# Patient Record
Sex: Female | Born: 1980
Health system: Southern US, Community
[De-identification: ages and names within clinical notes are randomized; demographics above are authoritative.]

## PROBLEM LIST (undated history)

## (undated) DIAGNOSIS — T8859XA Other complications of anesthesia, initial encounter: Secondary | ICD-10-CM

## (undated) DIAGNOSIS — Z9889 Other specified postprocedural states: Secondary | ICD-10-CM

## (undated) DIAGNOSIS — R112 Nausea with vomiting, unspecified: Secondary | ICD-10-CM

## (undated) DIAGNOSIS — E669 Obesity, unspecified: Secondary | ICD-10-CM

## (undated) DIAGNOSIS — J45909 Unspecified asthma, uncomplicated: Secondary | ICD-10-CM

## (undated) DIAGNOSIS — I82409 Acute embolism and thrombosis of unspecified deep veins of unspecified lower extremity: Secondary | ICD-10-CM

## (undated) DIAGNOSIS — Z8489 Family history of other specified conditions: Secondary | ICD-10-CM

## (undated) DIAGNOSIS — L509 Urticaria, unspecified: Secondary | ICD-10-CM

## (undated) HISTORY — DX: Acute embolism and thrombosis of unspecified deep veins of unspecified lower extremity: I82.409

## (undated) HISTORY — PX: KNEE SURGERY: SHX244

## (undated) HISTORY — PX: ABDOMINAL HYSTERECTOMY: SHX81

## (undated) HISTORY — PX: CHOLECYSTECTOMY: SHX55

## (undated) HISTORY — DX: Urticaria, unspecified: L50.9

---
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
## 2001-11-29 ENCOUNTER — Other Ambulatory Visit: Admission: RE | Admit: 2001-11-29 | Discharge: 2001-11-29 | Payer: Self-pay | Admitting: Obstetrics and Gynecology

## 2002-01-11 ENCOUNTER — Ambulatory Visit (HOSPITAL_COMMUNITY): Admission: RE | Admit: 2002-01-11 | Discharge: 2002-01-11 | Payer: Self-pay | Admitting: Internal Medicine

## 2003-06-26 ENCOUNTER — Other Ambulatory Visit: Admission: RE | Admit: 2003-06-26 | Discharge: 2003-06-26 | Payer: Self-pay | Admitting: Obstetrics and Gynecology

## 2003-12-03 ENCOUNTER — Inpatient Hospital Stay (HOSPITAL_COMMUNITY): Admission: AD | Admit: 2003-12-03 | Discharge: 2003-12-03 | Payer: Self-pay | Admitting: Obstetrics and Gynecology

## 2003-12-25 ENCOUNTER — Inpatient Hospital Stay (HOSPITAL_COMMUNITY): Admission: AD | Admit: 2003-12-25 | Discharge: 2003-12-25 | Payer: Self-pay | Admitting: Obstetrics and Gynecology

## 2004-01-09 ENCOUNTER — Inpatient Hospital Stay (HOSPITAL_COMMUNITY): Admission: AD | Admit: 2004-01-09 | Discharge: 2004-01-12 | Payer: Self-pay | Admitting: Obstetrics and Gynecology

## 2004-02-11 ENCOUNTER — Other Ambulatory Visit: Admission: RE | Admit: 2004-02-11 | Discharge: 2004-02-11 | Payer: Self-pay | Admitting: Obstetrics and Gynecology

## 2004-03-30 ENCOUNTER — Other Ambulatory Visit: Admission: RE | Admit: 2004-03-30 | Discharge: 2004-03-30 | Payer: Self-pay | Admitting: Obstetrics and Gynecology

## 2004-05-22 ENCOUNTER — Emergency Department (HOSPITAL_COMMUNITY): Admission: EM | Admit: 2004-05-22 | Discharge: 2004-05-22 | Payer: Self-pay | Admitting: Emergency Medicine

## 2004-05-24 ENCOUNTER — Emergency Department (HOSPITAL_COMMUNITY): Admission: EM | Admit: 2004-05-24 | Discharge: 2004-05-24 | Payer: Self-pay | Admitting: Emergency Medicine

## 2004-05-27 ENCOUNTER — Encounter (INDEPENDENT_AMBULATORY_CARE_PROVIDER_SITE_OTHER): Payer: Self-pay | Admitting: Specialist

## 2004-05-27 ENCOUNTER — Observation Stay (HOSPITAL_COMMUNITY): Admission: RE | Admit: 2004-05-27 | Discharge: 2004-05-28 | Payer: Self-pay | Admitting: General Surgery

## 2004-10-08 ENCOUNTER — Other Ambulatory Visit: Admission: RE | Admit: 2004-10-08 | Discharge: 2004-10-08 | Payer: Self-pay | Admitting: Obstetrics and Gynecology

## 2005-04-29 ENCOUNTER — Other Ambulatory Visit: Admission: RE | Admit: 2005-04-29 | Discharge: 2005-04-29 | Payer: Self-pay | Admitting: Obstetrics and Gynecology

## 2006-01-11 ENCOUNTER — Emergency Department (HOSPITAL_COMMUNITY): Admission: EM | Admit: 2006-01-11 | Discharge: 2006-01-11 | Payer: Self-pay | Admitting: Emergency Medicine

## 2006-06-24 ENCOUNTER — Ambulatory Visit (HOSPITAL_COMMUNITY): Admission: RE | Admit: 2006-06-24 | Discharge: 2006-06-24 | Payer: Self-pay | Admitting: Obstetrics and Gynecology

## 2006-06-29 ENCOUNTER — Ambulatory Visit (HOSPITAL_COMMUNITY): Admission: RE | Admit: 2006-06-29 | Discharge: 2006-06-29 | Payer: Self-pay | Admitting: Obstetrics and Gynecology

## 2006-06-29 ENCOUNTER — Encounter (INDEPENDENT_AMBULATORY_CARE_PROVIDER_SITE_OTHER): Payer: Self-pay | Admitting: Specialist

## 2007-02-23 ENCOUNTER — Ambulatory Visit (HOSPITAL_COMMUNITY): Admission: RE | Admit: 2007-02-23 | Discharge: 2007-02-23 | Payer: Self-pay | Admitting: Obstetrics and Gynecology

## 2007-03-14 ENCOUNTER — Ambulatory Visit (HOSPITAL_COMMUNITY): Admission: RE | Admit: 2007-03-14 | Discharge: 2007-03-14 | Payer: Self-pay | Admitting: Obstetrics and Gynecology

## 2007-05-05 ENCOUNTER — Inpatient Hospital Stay (HOSPITAL_COMMUNITY): Admission: AD | Admit: 2007-05-05 | Discharge: 2007-05-05 | Payer: Self-pay | Admitting: Obstetrics and Gynecology

## 2007-05-14 ENCOUNTER — Inpatient Hospital Stay (HOSPITAL_COMMUNITY): Admission: AD | Admit: 2007-05-14 | Discharge: 2007-05-14 | Payer: Self-pay | Admitting: Obstetrics and Gynecology

## 2007-05-15 ENCOUNTER — Inpatient Hospital Stay (HOSPITAL_COMMUNITY): Admission: AD | Admit: 2007-05-15 | Discharge: 2007-05-16 | Payer: Self-pay | Admitting: Obstetrics and Gynecology

## 2007-05-26 ENCOUNTER — Inpatient Hospital Stay (HOSPITAL_COMMUNITY): Admission: AD | Admit: 2007-05-26 | Discharge: 2007-05-30 | Payer: Self-pay | Admitting: Obstetrics and Gynecology

## 2007-05-27 ENCOUNTER — Encounter (INDEPENDENT_AMBULATORY_CARE_PROVIDER_SITE_OTHER): Payer: Self-pay | Admitting: Obstetrics and Gynecology

## 2008-01-17 ENCOUNTER — Encounter: Admission: RE | Admit: 2008-01-17 | Discharge: 2008-01-17 | Payer: Self-pay | Admitting: Orthopedic Surgery

## 2009-01-27 ENCOUNTER — Encounter: Admission: RE | Admit: 2009-01-27 | Discharge: 2009-01-27 | Payer: Self-pay | Admitting: Obstetrics and Gynecology

## 2010-08-24 ENCOUNTER — Inpatient Hospital Stay (HOSPITAL_COMMUNITY)
Admission: AD | Admit: 2010-08-24 | Discharge: 2010-08-26 | Payer: Self-pay | Source: Home / Self Care | Attending: Obstetrics and Gynecology | Admitting: Obstetrics and Gynecology

## 2010-08-26 LAB — CBC
HCT: 36.2 % (ref 36.0–46.0)
HCT: 29.4 % — ABNORMAL LOW (ref 36.0–46.0)
Hemoglobin: 12.2 g/dL (ref 12.0–15.0)
Hemoglobin: 9.5 g/dL — ABNORMAL LOW (ref 12.0–15.0)
MCH: 29.5 pg (ref 26.0–34.0)
MCH: 28.5 pg (ref 26.0–34.0)
MCHC: 33.7 g/dL (ref 30.0–36.0)
MCHC: 32.3 g/dL (ref 30.0–36.0)
MCV: 87.7 fL (ref 78.0–100.0)
MCV: 88.3 fL (ref 78.0–100.0)
Platelets: 293 10*3/uL (ref 150–400)
Platelets: 242 10*3/uL (ref 150–400)
RBC: 4.13 MIL/uL (ref 3.87–5.11)
RBC: 3.33 MIL/uL — ABNORMAL LOW (ref 3.87–5.11)
RDW: 14.2 % (ref 11.5–15.5)
RDW: 14.4 % (ref 11.5–15.5)
WBC: 10.1 10*3/uL (ref 4.0–10.5)
WBC: 9.9 10*3/uL (ref 4.0–10.5)

## 2010-08-26 LAB — RPR: RPR Ser Ql: NONREACTIVE

## 2010-09-01 NOTE — Discharge Summary (Signed)
  Krista [name], Krista [name] NO.:  [id number]  MEDICAL RECORD NO.:  [id number]          PATIENT TYPE:  INP  LOCATION:  9131                          FACILITY:  [facility]  PHYSICIAN:  Guy Sandifer. Henderson Cloud, M.D. DATE OF BIRTH:  Dec 24, 1980  DATE OF ADMISSION:  08/24/2010 DATE OF DISCHARGE:  08/26/2010                              DISCHARGE SUMMARY   ADMITTING DIAGNOSES: 1. Intrauterine pregnancy at 37-1/7 weeks estimated gestational age. 2. Previous cesarean section. 3. Spontaneous onset of labor.  DISCHARGE DIAGNOSES: 1. Status post low transverse cesarean section. 2. Viable female infant.  PROCEDURE:  Repeat low transverse cesarean section.  REASON FOR ADMISSION:  Please see written H and P.  HOSPITAL COURSE:  The patient is a 30 year old gravida 3, para 2 that was admitted to [facility] at 337-1/7 weeks estimated gestational age with spontaneous onset of labor.  The patient had a previous vaginal delivery as well as a cesarean delivery.  She has desired to proceed with a repeat low transverse cesarean section.  The patient was then taken to the operating room where spinal anesthesia was administered without difficulty.  A low transverse incision was made with delivery of a viable female infant weighing 5 pounds 11 ounces with Apgars of 9 at 1 minute and 9 at 5 minutes.  The patient tolerated the procedure well and was taken to the recovery room in stable condition. Postoperative day #1, the patient was without complaint.  Vital signs were stable.  She was afebrile.  Abdomen was soft.  Fundus firm and nontender.  Abdominal dressing noted to be clean, dry and intact.  Foley had been discontinued.  She was voiding well.  Laboratory findings showed a hemoglobin 9.5 and platelet count showed of 242,000.  Blood type was known to be A positive.  On postoperative day #2, the patient did desire early discharge.  Vital signs were stable.  She was afebrile. Abdomen was  soft.  Fundus firm and nontender.  Incision was clean, dry and intact.  Discharge instructions were reviewed and the patient was later discharged home.  CONDITION ON DISCHARGE:  Stable.  DIET:  Regular as tolerated.  ACTIVITY:  No heavy lifting, no driving x2 weeks, no vaginal entry.  FOLLOWUP:  The patient to follow up in the office in 1-2 weeks for an incision check.  She is to call for temperature greater than 100 degrees, persistent nausea, vomiting, heavy vaginal bleeding and/or redness or drainage from the incisional site.  DISCHARGE MEDICATIONS: 1. Tylox #30 one p.o. every 4-6 h. p.r.n. 2. Motrin 600 mg every 6 hours. 3. Prenatal vitamins 1 p.o. daily.     Julio Sicks, N.P.   ______________________________ Guy Sandifer. Henderson Cloud, M.D.    CC/MEDQ  D:  08/26/2010  T:  08/26/2010  Job:  [id number]  Electronically Signed by Julio Sicks N.P. on 08/28/2010 08:54:26 AM Electronically Signed by Harold Hedge M.D. on 09/01/2010 08:47:27 AM

## 2010-09-07 ENCOUNTER — Inpatient Hospital Stay: Admission: RE | Admit: 2010-09-07 | Payer: Self-pay | Source: Home / Self Care | Admitting: Obstetrics and Gynecology

## 2010-09-11 NOTE — Op Note (Signed)
NAMEMarland Kitchen  Krista, [name] NO.:  [id number]  MEDICAL RECORD NO.:  [id number]          PATIENT TYPE:  INP  LOCATION:  9131                          [facility]ILITY:  [facility]  PHYSICIAN:  Juluis Mire, M.D.   DATE OF BIRTH:  12-Feb-1981  DATE OF PROCEDURE:  08/24/2010 DATE OF DISCHARGE:                              OPERATIVE REPORT   PREOPERATIVE DIAGNOSES: 1. Intrauterine pregnancy at 37-1/7th with previous cesarean section. 2. Spontaneous onset of labor with desires repeat cesarean section.  POSTOPERATIVE DIAGNOSES: 1. Intrauterine pregnancy at 37-1/7th with previous cesarean section. 2. Spontaneous onset of labor with desires repeat cesarean section.  OPERATIVE PROCEDURE:  Repeat low transverse cesarean section.  SURGEON:  Juluis Mire, MD  ANESTHESIA:  Spinal.  ESTIMATED BLOOD LOSS:  800 mL.  PACKS AND DRAINS:  None.  INTRAOPERATIVE BLOOD PLACED:  None.  COMPLICATIONS:  None.  INDICATIONS:  A 30 year old gravida 3, para 2 at 37-1/7th weeks, who presents with spontaneous onset of labor as documented of progressive cervical changes.  The patient had a previous vaginal delivery as well as a followup cesarean section.  She desires to proceed with repeat cesarean section.  The risks were explained including the risk of infection.  The risk of hemorrhage could require transfusion with risk of AIDS or hepatitis.  Excessive bleeding could require hysterectomy. The risk of injury to adjacent organs including bladder, bowel, ureters that could require further exploratory surgery.  Risk of deep venous thrombosis and pulmonary embolus.  The patient does understand the indications, risks.  DESCRIPTION OF PROCEDURE:  The patient was taken to the OR, placed in supine position with the left lateral tilt.  After satisfactory level of spinal anesthesia was obtained, the abdomen was prepped out with Betadine and draped in sterile field.  Low transverse skin incision was made  with knife, carried through subcutaneous tissue.  Anterior rectus fascia was entered sharply and incision was fashioned laterally.  The fascia was taken off the muscle superiorly and inferiorly.  Rectus muscles were separated in the midline.  Perineum was entered sharply. Incision and perineum extended both superiorly and inferiorly.  A low transverse bladder flap was developed.  Low-transverse uterine incision was begun with knife, extended laterally using manual traction.  The infant presented in vertex presentation, delivered with elevation of head and fundal pressure.  The infant was a viable female, weighing 5 pounds and 11 ounces, Apgars were 9 and 9.  Placenta was removed manually.  Uterus was then exteriorized for closure.  Tubes and ovaries were unremarkable.  Uterus was closed in running locking suture of 0 chromic using a 2-layer closure technique.  Tubes and ovaries were unremarkable.  Uterus was returned to the abdominal cavity.  Areas of bleeding were brought under control with figure-of-eights of 0 chromic. Hemostasis was excellent.  We did irrigate the pelvic cavity.  Urine output remained clear and adequate.  Muscles and peritoneum were closed with a running suture of 3-0 Vicryl.  Fascia was closed with a running suture of 0 PDS.  Adipose layer was closed with interrupted sutures of 2- 0 plain catgut.  Skin  was closed with running subcuticular of 4-0 Monocryl.  Steri-Strips were applied.  Sponge, instrument, and needle count was reported as correct by circulating nurse x2.  Foley catheter remained clear at the time of closure.  The patient tolerated the procedure well, was returned to recovery room in good condition.     Juluis Mire, M.D.     JSM/MEDQ  D:  08/24/2010  T:  08/24/2010  Job:  [id number]  Electronically Signed by Richardean Chimera M.D. on 09/11/2010 01:35:43 PM

## 2010-12-22 NOTE — Op Note (Signed)
Krista [name], Krista [name] NO.:  [id number]   MEDICAL RECORD NO.:  [id number]          PATIENT TYPE:  INP   LOCATION:  9125                          [facility]ILITY[facility]  [facility]   PHYSICIAN:  Guy Sandifer. Henderson Cloud, M.D. DATE OF BIRTH:  [birth year]   DATE OF PROCEDURE:  05/27/2007  DATE OF DISCHARGE:                               OPERATIVE REPORT   PREOPERATIVE DIAGNOSIS:  Arrest of dilation.   POSTOPERATIVE DIAGNOSIS:  Arrest of dilation.   PROCEDURE:  Low-transverse cesarean section.   SURGEON:  Harold Hedge, MD.   ANESTHESIA:  Epidural, Burnett Corrente, M.D.   SPECIMENS:  Placenta to pathology.   ESTIMATED BLOOD LOSS:  800 mL.   FINDINGS:  Viable female infant, Apgars of 9 and 9 at 1 and 5 minutes  respectively.  Birth weight 6 pounds 5 ounces.  Arterial cord pH 7.34.   INDICATIONS AND CONSENT:  This patient is a 30 year old, married, white  female G3, P1 with an EDC of June 06, 2007.  Prenatal care has been  complicated by worsening left fetal hydronephrosis on ultrasound.  Group  B beta strep culture is negative.  The patient is admitted on May 26, 2007 for two-stage induction of labor.  She received Cytotec on the  morning of May 27, 2007 and was placed on Pitocin.  Contractions are  firm and regular.  There is a suspicion of decelerations on the external  fetal monitor.  Artificial rupture of membranes is carried out and the  cervix is 3 cm dilated, 50% effaced, -3 station.  Internal fetal  monitors were placed.  Fetal heartbeat is reactive.  The labor pattern  is adequate.  After an additional 1 hour of good labor,  the cervix was  unchanged and the vertex remained out of the fetal pelvis.  The options  were discussed with the patient and cesarean section was decided upon.  The potential risks and complications were reviewed with the patient and  her husband including but not limited to infection, organ damage,  bleeding requiring transfusion of blood products  with possible HIV and  hepatitis acquisition, DVT, PE, and pneumonia.  All questions were  answered and consent is on the chart.   PROCEDURE:  The patient is taken to the operating room where she is  identified,  epidural anesthetic is augmented to the surgical level and  she is placed in the dorsal supine position with a 15 degree left  lateral wedge.  A Foley catheter is in place.  She is prepped and draped  in a sterile fashion.  After testing for adequate epidural anesthesia,  the skin is entered through a Pfannenstiel incision and dissection is  carried out in layers to the peritoneum.  The peritoneum was incised,  extended superiorly and inferiorly.  The vesicouterine peritoneum was  taken down cephalolaterally, the bladder flap was developed and bladder  blade was placed.  The uterus is incised in a low transverse manner, the  uterine cavity is entered bluntly with a hemostat.  The uterine incision  extended cephalolaterally with fingers.  The vertex is  then delivered.  Oral and nasal pharynx were suctioned and the remainder of the baby is  delivered.  There was a cord wrapped around the baby's shoulder. The  cord is clamped and cut, the baby is handed to the waiting pediatrics  team.  The placenta is manually delivered and sent to pathology.  The  uterine cavity is clean.  The uterus is closed in two running locking  imbricating layers of #0 Monocryl suture which achieves good hemostasis.  Tubes and ovaries were normal bilaterally.  The anterior peritoneum was  closed in running fashion with #0 Monocryl suture which is also used to  reapproximate the pyramidalis muscle in the midline.  The anterior  rectus fascia is closed in running fashion with #0 PDS suture and the  subcutaneous tissue was reapproximated with 2-0 plain suture and the  skin is closed with clips.  All sponge, instrument and needle counts  were correct and the patient is transferred to the recovery room in   stable condition.      Guy Sandifer Henderson Cloud, M.D.  Electronically Signed     JET/MEDQ  D:  05/27/2007  T:  05/29/2007  Job:  [id number]

## 2010-12-25 NOTE — Op Note (Signed)
NAMEMarland [name]  CALLYN, SEVERTSON NO.:  [id number]   MEDICAL RECORD NO.:  [id number]          PATIENT TYPE:  AMB   LOCATION:  SDC                           [facility]   PHYSICIAN:  Guy Sandifer. Henderson Cloud, M.D. DATE OF BIRTH:  1981/04/13   DATE OF PROCEDURE:  06/29/2006  DATE OF DISCHARGE:                                 OPERATIVE REPORT   PREOPERATIVE DIAGNOSIS:  Spontaneous abortion.   POSTOPERATIVE DIAGNOSIS:  Spontaneous abortion.   PROCEDURE:  1. Dilatation evacuation.  2. 1% Xylocaine paracervical block.   SURGEON:  Harold Hedge, M.D.   ANESTHESIA:  MAC.   SPECIMENS:  1. Products of conception to pathology.  2. A small portion products of conception to lab for karyotype.   ESTIMATED BLOOD LOSS:  Minimal.   INDICATIONS AND CONSENT:  The patient is a 30 year old married white female  G2, P1 with spontaneous abortion.  Details dictated in history and physical.  Procedure has been discussed preoperatively.  Potential risks and  complications discussed preoperatively with the patient and her husband  including but not limited to infection, uterine perforation, organ damage,  bleeding requiring transfusion of blood products with possible HIV and  hepatitis acquisition, DVT, PE, pneumonia, laparotomy, laparoscopy and  intrauterine synechia and secondary infertility.  All questions were  answered and consent signed on the chart.   PROCEDURE:  The patient taken to operating room where she is identified,  placed in dorsosupine position and given intravenous sedation.  She is then  placed in dorsal lithotomy position where she is prepped, bladder straight  catheterized and she is draped in sterile fashion.  The bivalve speculum was  placed in vagina and the anterior cervical lip was injected with 1% plain  Xylocaine and grasped with single-tooth tenaculum.  Paracervical block is  then placed at 2, 4, 5, 7, 8 and 10 o'clock positions with approximately 20  mL total of 1%  plain Xylocaine.  Cervix gently progressively dilated to 27  dilator.  #7 curved curette was placed in the intrauterine cavity and  suction curettage is carried out for obvious products of conception.  10  units of Pitocin were added to the remaining 500 mL of IV fluids after  initial pass of the suction curette.  Alternating sharp and suction  curettage is carried until the cavity  was clean.  Good hemostasis was noted.  The patient is given 1 gram of Ancef  IV.  Blood type is A+.  Small portion of the products of conception was sent  for karyotype.  She will be discharged home on Methergine 0.2 mg #6 one p.o.  t.i.d..  Follow-up in the office in 2 weeks.      Guy Sandifer Henderson Cloud, M.D.  Electronically Signed     JET/MEDQ  D:  06/29/2006  T:  06/29/2006  Job:  [id number]

## 2010-12-25 NOTE — H&P (Signed)
NAMEMarland Kitchen  Krista [name], Krista [name] NO.:  [id number]   MEDICAL RECORD NO.:  [id number]          PATIENT TYPE:  AMB   LOCATION:  SDC                           [facility]   PHYSICIAN:  Guy Sandifer. Henderson Cloud, M.D. DATE OF BIRTH:  Oct 23, 1980   DATE OF ADMISSION:  06/29/2006  DATE OF DISCHARGE:                                HISTORY & PHYSICAL   CHIEF COMPLAINT:  Miscarriage.   HISTORY OF PRESENT ILLNESS:  This patient is a 30 year old married white  female, G2 P1, who has been followed for a first trimester gestation with a  low fetal heart rate and a subchorionic hemorrhage.  The fetal pole is  consistent with approximately 6-1/2 weeks estimated gestational age.  There  is no fetal heartbeat noted on prolonged examination.  The patient is being  admitted for dilatation and evacuation.   PAST MEDICAL HISTORY:  Negative.   PAST SURGICAL HISTORY:  Cholecystectomy.   FAMILY HISTORY:  Chronic hypertension in mother.  Renal agenesis, paternal  grandmother.  Alzheimer's disease, maternal great-grandmother.  Skin cancer,  maternal grandfather.  Type 2 diabetes, maternal grandfather.   OBSTETRIC HISTORY:  Vaginal delivery x1.   SOCIAL HISTORY:  Denies tobacco, alcohol, or drug abuse.   MEDICATIONS:  Prenatal vitamins.   ALLERGIES:  1. PREDNISONE.  2. CODEINE.   REVIEW OF SYSTEMS:  NEUROLOGIC:  Denies headache.  CARDIOVASCULAR:  Denies  chest pain.  PULMONARY:  Denies shortness of breath.  GI:  Denies recent  changes in bowel habits.   PHYSICAL EXAMINATION:  GENERAL:  Height 5 feet 0 inches, weight 190 pounds.  HEENT:  Without thyromegaly.  LUNGS:  Clear to auscultation.  HEART:  Regular rate and rhythm.  BACK:  Without CVA tenderness.  BREASTS:  Not examined.  ABDOMEN:  Soft, nontender, without masses.  PELVIC:  Cervix is closed, thick, and high.  Uterus is 6 weeks in size.  Adnexa nontender, without palpable masses.  EXTREMITIES:  Grossly within normal limits.  NEUROLOGIC:  Within normal limits.   LABORATORIES:  Blood type is A positive.   ASSESSMENT:  Spontaneous miscarriage.   PLAN:  Dilatation and evacuation.      Guy Sandifer Henderson Cloud, M.D.  Electronically Signed     JET/MEDQ  D:  06/28/2006  T:  06/28/2006  Job:  [id number]

## 2010-12-25 NOTE — Op Note (Signed)
Krista [name], PEGGS NO.:  [id number]   MEDICAL RECORD NO.:  [id number]          PATIENT TYPE:  AMB   LOCATION:  DAY                          FACILITY:  [facility]   PHYSICIAN:  Sharlet Salina T. Hoxworth, M.D.DATE OF BIRTH:  November 22, 1980   DATE OF PROCEDURE:  05/27/2004  DATE OF DISCHARGE:                                 OPERATIVE REPORT   PREOPERATIVE DIAGNOSIS:  Cholelithiasis, cholecystitis.   POSTOPERATIVE DIAGNOSIS:  Cholelithiasis, cholecystitis.   PROCEDURE:  Laparoscopic cholecystectomy with intraoperative cholangiogram.   SURGEON:  Sharlet Salina T. Hoxworth, M.D.   ANESTHESIA:  General.   BRIEF HISTORY:  Krista [name] is a 30 year old white female, recently  postpartum, who presents with five days of persistent epigastric and right  upper quadrant abdominal pain associated with nausea.  She has had a workup,  including a gallbladder ultrasound showing multiple small stones and a  borderline dilated common bile duct.  LFTs are normal.  She is felt to have  ongoing biliary colic versus early cholecystitis, and urgent laparoscopic  cholecystectomy with cholangiogram has been recommended and accepted.  The  procedure, indications, risks of bleeding, infection, bile leak, and bile  duct injury were discussed and understood.  She is now brought to the  operating room for this procedure.   DESCRIPTION OF PROCEDURE:  Patient is brought to the operating room and  placed in a supine position on the operating room table.  General  endotracheal anesthesia was induced.  She received preoperative antibiotics.  The abdomen was widely and sterilely prepped and draped.  Local anesthesia  was used to infiltrate the trocar sites prior to the incisions.  A 1 cm  incision was made in the umbilicus, dissected, and carried down to the  midline fascia, which was sharply incised at 1 cm.  The peritoneum was  entered under direct vision.  Through a mattress suture of 0 Vicryl, the  Hasson  trocar was placed.  Under direct vision, a 10 mm trocar was placed in  the subxiphoid area and two 5 mm trocars on the right subcostal margin.  The  gallbladder is visualized and appeared moderately tense and edematous,  although not severely acutely inflamed.  The fundus was grasped and elevated  over the liver.  There were some chronic omental adhesions to the  infundibulum, which were sharply taken down with cautery, and the  infundibulum retracted inferolaterally.  Fibrofatty tissue was stripped off  the neck of the gallbladder, and Calot's triangle was thoroughly dissected.  The cystic duct  and cystic artery were identified.  The cystic duct  was  dissected free for about 1 cm, and the cystic duct/gallbladder junction  dissected free at 160 degrees.  When the anatomy was cleared, the cystic  duct  was clipped at its junction with the gallbladder, and the cystic  artery was clipped.  Operative cholangiogram was then obtained through the  cystic duct, which showed good filling of normal common bile duct and  intrahepatic ducts with free flow into the duodenum and no filling defects.  Following this, the cholangiocatheter was removed, and the cystic  duct was  doubly clipped proximally and divided.  The cystic artery was further  clipped and divided.  The gallbladder is then dissected free from its bed  using hook cautery and removed intact through the umbilicus.  The operative  site was inspected for hemostasis, which appeared complete.  The trocars  were removed,  and all CO2 evacuated.  The mattress suture was secured at the umbilicus.  Skin incisions were closed with interrupted subcuticular, 4-0 Monocryl, and  Steri-Strips.  Sponge, needle, and instrument counts were correct.  Dry  sterile dressings were applied.  Patient was taken to the recovery room in  good condition.      BTH/MEDQ  D:  05/27/2004  T:  05/27/2004  Job:  [id number]

## 2010-12-25 NOTE — Discharge Summary (Signed)
NAMEMarland [name]  CARSYN, BOSTER NO.:  [id number]   MEDICAL RECORD NO.:  [id number]          PATIENT TYPE:  INP   LOCATION:  9125                          [facility]   PHYSICIAN:  Freddy Finner, M.D.   DATE OF BIRTH:  [birth year]   DATE OF ADMISSION:  05/26/2007  DATE OF DISCHARGE:  05/30/2007                               DISCHARGE SUMMARY   ADMITTING DIAGNOSES:  1. Intrauterine pregnancy at 34 weeks estimated gestational age.  2. Two-stage induction of labor secondary to left fetal hydronephrosis      as noted by ultrasound.   DISCHARGE DIAGNOSES:  1. Status post low transverse cesarean section secondary to arrest of      dilatation.  2. Viable female infant.   PROCEDURE:  Primary low transverse cesarean section.   REASON FOR ADMISSION:  Please see written H&P.   HOSPITAL COURSE:  The patient is a 26-year white married female gravida  3, para 1 that was admitted to [facility] for two-stage  induction of labor secondary to fetal hydronephrosis.  On admission the  patient did receive Cytotec and was later placed on Pitocin.  Contractions were regular.  There was a suspicion of a deceleration on  the external fetal monitor.  Artificial rupture of membranes was  performed which revealed clear fluid.  Cervix was dilated to 3 cm, 50%  effaced, with vertex at -3 station.  Internal fetal monitors were placed  for adequate monitoring of her labor and fetal heart tones.  After an  additional hour of good labor the cervix remained unchanged and the  vertex remained out of the pelvis.  Decision was made to proceed with a  primary low transverse cesarean section.  The patient was then  transferred to the operating room where epidural was dosed to an  adequate surgical level.  A low transverse incision was made with the  delivery of a viable female infant weighing 6 pounds 5 ounces with Apgars  of 9 at one minute and 9 at five minutes.  Arterial cord pH was  7.34.  The patient tolerated the procedure well and was taken to the recovery  room in stable condition.  On postoperative day #1 the patient was  without complaint.  Vital signs were stable.  She was afebrile.  Abdomen  was soft with good return of bowel function.  Fundus was firm and  nontender.  Abdominal dressing was noted to be clean, dry and intact.  Laboratory findings revealed hemoglobin of 9.1.  On postoperative day #2  the patient was without complaint.  Vital signs were remained stable.  She was afebrile.  Abdomen was soft.  Fundus firm and nontender.  Abdominal dressing had been removed revealing an incision that was  clean, dry and intact.  On postoperative day #3 the patient was without  complaint.  Vital signs remained stable.  Abdomen was soft.  Fundus firm  and nontender.  Incision was clean, dry and intact.  Staples were  removed and the patient was later discharged home.   CONDITION ON DISCHARGE:  Good.  DIET:  Regular as tolerated.   ACTIVITY:  No heavy lifting, no driving x2 weeks, no vaginal entry.   FOLLOWUP:  The patient to follow up in the office in 1-2 weeks for an  incision check.  She is to call for temperature greater than 100  degrees, persistent nausea and vomiting, heavy vaginal bleeding, and/or  redness or drainage from the incisional site.   DISCHARGE MEDICATIONS:  1. Percocet 5/325 #30 one p.o. every 4-6 hours p.r.n.  2. Motrin 600 mg every 6 hours.  3. Prenatal vitamins one p.o. daily.  4. Colace one p.o. daily p.r.n.      Julio Sicks, N.P.      Freddy Finner, M.D.  Electronically Signed    CC/MEDQ  D:  06/20/2007  T:  06/20/2007  Job:  [id number]

## 2011-05-19 LAB — CBC
HCT: 26.5 — ABNORMAL LOW
HCT: 34 — ABNORMAL LOW
Hemoglobin: 11.5 — ABNORMAL LOW
Hemoglobin: 9.1 — ABNORMAL LOW
MCHC: 33.7
MCHC: 34.3
MCV: 88.2
MCV: 89.4
Platelets: 278
Platelets: 338
RBC: 3.01 — ABNORMAL LOW
RBC: 3.81 — ABNORMAL LOW
RDW: 14.6 — ABNORMAL HIGH
RDW: 14.7 — ABNORMAL HIGH
WBC: 11.1 — ABNORMAL HIGH
WBC: 12.9 — ABNORMAL HIGH

## 2011-05-19 LAB — RAPID HIV SCREEN (WH-MAU): Rapid HIV Screen: NONREACTIVE

## 2011-05-19 LAB — RPR: RPR Ser Ql: NONREACTIVE

## 2011-05-20 LAB — URINALYSIS, ROUTINE W REFLEX MICROSCOPIC
Bilirubin Urine: NEGATIVE
Glucose, UA: NEGATIVE
Hgb urine dipstick: NEGATIVE
Ketones, ur: NEGATIVE
Nitrite: NEGATIVE
Protein, ur: NEGATIVE
Specific Gravity, Urine: 1.01
Urobilinogen, UA: 0.2
pH: 7

## 2011-05-30 ENCOUNTER — Emergency Department (HOSPITAL_BASED_OUTPATIENT_CLINIC_OR_DEPARTMENT_OTHER)
Admission: EM | Admit: 2011-05-30 | Discharge: 2011-05-30 | Disposition: A | Discharge status: Home / Self Care | Payer: PRIVATE HEALTH INSURANCE | Attending: Emergency Medicine | Admitting: Emergency Medicine

## 2011-05-30 ENCOUNTER — Encounter: Payer: Self-pay | Admitting: *Deleted

## 2011-05-30 DIAGNOSIS — Z4689 Encounter for fitting and adjustment of other specified devices: Secondary | ICD-10-CM

## 2011-05-30 DIAGNOSIS — M79609 Pain in unspecified limb: Secondary | ICD-10-CM | POA: Insufficient documentation

## 2011-05-30 NOTE — ED Notes (Signed)
States had cast placed Friday and c/o that it was too tight then but was told "it will loosen up"- reports thumb "turned blue" and she had to cut part of cast around her thumb to loosen it pta

## 2011-05-30 NOTE — ED Provider Notes (Signed)
History     CSN: [id number] Arrival date & time: 05/30/2011  4:53 PM   First MD Initiated Contact with Patient 05/30/11 1651      Chief Complaint  Patient presents with  . Cast Problem    (Consider location/radiation/quality/duration/timing/severity/associated sxs/prior treatment) HPI Comments: Pt states that she fell and had a colles fracture at the beginning of the month:pt states that she went back to the orthopedist and they put a cast on her and yesterday her thumb turned blue and she cut that part and now she is saying that it is causing part of her hand to go numb:pt is requesting a new cast  Patient is a 30 y.o. female presenting with extremity pain. The history is provided by the patient. No language interpreter was used.  Extremity Pain This is a recurrent problem. The current episode started in the past 7 days. The problem occurs constantly. The problem has been unchanged.  Extremity Pain This is a recurrent problem. The current episode started in the past 7 days. The problem occurs constantly. The problem has been unchanged.    History reviewed. No pertinent past medical history.  Past Surgical History  Procedure Date  . Cholecystectomy   . Cesarean section     No family history on file.  History  Substance Use Topics  . Smoking status: Never Smoker   . Smokeless tobacco: Not on file  . Alcohol Use: No    OB History    Grav Para Term Preterm Abortions TAB SAB Ect Mult Living                  Review of Systems  Constitutional: Negative.   Respiratory: Negative.   Cardiovascular: Negative.     Allergies  Codeine and Prednisone  Home Medications  No current outpatient prescriptions on file.  BP 132/77  Pulse 72  Temp(Src) 98.2 F (36.8 C) (Oral)  Resp 20  SpO2 100%  LMP 04/23/2011  Physical Exam  Nursing note and vitals reviewed. Constitutional: She is oriented to person, place, and time.  Cardiovascular: Normal rate and regular rhythm.     Pulmonary/Chest: Effort normal and breath sounds normal.  Musculoskeletal:       Bruising noted to the left thumb  Neurological: She is alert and oriented to person, place, and time.       Pulses intact    ED Course  Procedures (including critical care time)  Labs Reviewed - No data to display No results found.   1. Cast removal       MDM  Pt had new splint applied:pt can follow up with her orthopedist or as per pt request for a new orthopedist    Medical screening examination/treatment/procedure(s) were performed by non-physician practitioner and as supervising physician I was immediately available for consultation/collaboration. Osvaldo Human, M.D.    Teressa Lower, NP 05/30/11 1732  Carleene Cooper III, MD 06/01/11 1911

## 2013-05-17 ENCOUNTER — Encounter (HOSPITAL_BASED_OUTPATIENT_CLINIC_OR_DEPARTMENT_OTHER): Payer: Self-pay | Admitting: Emergency Medicine

## 2013-05-17 ENCOUNTER — Emergency Department (HOSPITAL_BASED_OUTPATIENT_CLINIC_OR_DEPARTMENT_OTHER)
Admission: EM | Admit: 2013-05-17 | Discharge: 2013-05-17 | Disposition: A | Discharge status: Home / Self Care | Payer: Medicaid Other | Attending: Emergency Medicine | Admitting: Emergency Medicine

## 2013-05-17 ENCOUNTER — Emergency Department (HOSPITAL_BASED_OUTPATIENT_CLINIC_OR_DEPARTMENT_OTHER): Payer: Medicaid Other

## 2013-05-17 DIAGNOSIS — M25562 Pain in left knee: Secondary | ICD-10-CM

## 2013-05-17 DIAGNOSIS — M25569 Pain in unspecified knee: Secondary | ICD-10-CM | POA: Insufficient documentation

## 2013-05-17 DIAGNOSIS — R52 Pain, unspecified: Secondary | ICD-10-CM | POA: Insufficient documentation

## 2013-05-17 MED ORDER — HYDROCODONE-ACETAMINOPHEN 5-325 MG PO TABS
1.0000 | ORAL_TABLET | Freq: Once | ORAL | Status: AC
  Administered 2013-05-17: 1 via ORAL
  Filled 2013-05-17: qty 1

## 2013-05-17 MED ORDER — HYDROCODONE-ACETAMINOPHEN 5-325 MG PO TABS: 1.0000 | ORAL_TABLET | Freq: Four times a day (QID) | ORAL | Status: DC | PRN

## 2013-05-17 NOTE — ED Notes (Signed)
Pt reports she twisted left knee 2 days ago and now has pain.

## 2013-05-17 NOTE — ED Provider Notes (Signed)
CSN: [id number]     Arrival date & time 05/17/13  1140 History   First MD Initiated Contact with Patient 05/17/13 1500     Chief Complaint  Patient presents with  . Knee Injury   (Consider location/radiation/quality/duration/timing/severity/associated sxs/prior Treatment) Patient is a 32 y.o. female presenting with knee pain. The history is provided by the patient. No language interpreter was used.  Knee Pain Location:  Knee Time since incident:  1 day Knee location:  L knee Pain details:    Quality:  Aching   Radiates to:  Does not radiate   Severity:  Moderate   Onset quality:  Sudden   Duration:  1 week   Timing:  Constant   Progression:  Unchanged Chronicity:  New Dislocation: no   Foreign body present:  No foreign bodies Tetanus status:  Unknown Prior injury to area:  Yes (fell on patella about 2 weeks ago) Relieved by:  Nothing Exacerbated by: full flexion/extension. Ineffective treatments: aleve. Associated symptoms: no back pain, no decreased ROM, no fatigue, no fever, no muscle weakness, no neck pain, no numbness, no stiffness, no swelling and no tingling   Risk factors: no concern for non-accidental trauma and no known bone disorder     History reviewed. No pertinent past medical history. Past Surgical History  Procedure Laterality Date  . Cholecystectomy    . Cesarean section     No family history on file. History  Substance Use Topics  . Smoking status: Never Smoker   . Smokeless tobacco: Not on file  . Alcohol Use: No   OB History   Grav Para Term Preterm Abortions TAB SAB Ect Mult Living                 Review of Systems  Constitutional: Negative for fever, chills, diaphoresis, activity change, appetite change and fatigue.  HENT: Negative for congestion, facial swelling, rhinorrhea and sore throat.   Eyes: Negative for photophobia and discharge.  Respiratory: Negative for cough, chest tightness and shortness of breath.   Cardiovascular: Negative  for chest pain, palpitations and leg swelling.  Gastrointestinal: Negative for nausea, vomiting, abdominal pain and diarrhea.  Endocrine: Negative for polydipsia and polyuria.  Genitourinary: Negative for dysuria, frequency, difficulty urinating and pelvic pain.  Musculoskeletal: Negative for arthralgias, back pain, neck pain, neck stiffness and stiffness.  Skin: Negative for color change and wound.  Allergic/Immunologic: Negative for immunocompromised state.  Neurological: Negative for facial asymmetry, weakness, numbness and headaches.  Hematological: Does not bruise/bleed easily.  Psychiatric/Behavioral: Negative for confusion and agitation.    Allergies  Codeine and Prednisone  Home Medications   Current Outpatient Rx  Name  Route  Sig  Dispense  Refill  . HYDROcodone-acetaminophen (NORCO) 5-325 MG per tablet   Oral   Take 1 tablet by mouth every 6 (six) hours as needed for pain.   15 tablet   0   . ibuprofen (ADVIL,MOTRIN) 200 MG tablet   Oral   Take 600 mg by mouth every 6 (six) hours as needed. For pain           BP 141/90  Pulse 71  Temp(Src) 98.7 F (37.1 C) (Oral)  Resp 18  Ht 4\' 11"  (1.499 m)  Wt 223 lb (101.152 kg)  BMI 45.02 kg/m2  SpO2 98%  LMP 05/04/2013 Physical Exam  Constitutional: She is oriented to person, place, and time. She appears well-developed and well-nourished. No distress.  HENT:  Head: Normocephalic and atraumatic.  Mouth/Throat: No oropharyngeal exudate.  Eyes: Pupils are equal, round, and reactive to light.  Neck: Normal range of motion. Neck supple.  Cardiovascular: Normal rate, regular rhythm and normal heart sounds.  Exam reveals no gallop and no friction rub.   No murmur heard. Pulmonary/Chest: Effort normal and breath sounds normal. No respiratory distress. She has no wheezes. She has no rales.  Abdominal: Soft. Bowel sounds are normal. She exhibits no distension and no mass. There is no tenderness. There is no rebound and no  guarding.  Musculoskeletal: Normal range of motion. She exhibits no edema.       Left knee: She exhibits abnormal meniscus. Tenderness found. Patellar tendon tenderness noted.       Legs: Neurological: She is alert and oriented to person, place, and time.  Skin: Skin is warm and dry.  Psychiatric: She has a normal mood and affect.    ED Course  Procedures (including critical care time) Labs Review Labs Reviewed - No data to display Imaging Review Dg Knee Complete 4 Views Left  05/17/2013   CLINICAL DATA:  Left knee pain post injury 1 month ago  EXAM: LEFT KNEE - COMPLETE 4+ VIEW  COMPARISON:  None.  FINDINGS: There is no evidence of fracture, dislocation, or joint effusion. There is no evidence of arthropathy or other focal bone abnormality. Soft tissues are unremarkable.  IMPRESSION: Negative.   Electronically Signed   By: Natasha Mead M.D.   On: 05/17/2013 12:21    EKG Interpretation   None       MDM   1. Knee pain, acute, left    Pt is a 33 y.o. female with Pmhx as above who presents with L knee pain two weeks after falling onto patella. Last night suffered a twisting injury at home, now pain worse.  VSS, pt in NAD.  +ttp over patella ligament, but has FROM. Slight catching felt w/ rotational mvmt of knee suggesting possible meniscal injury.  No other ligamentaou laxity on PE. XR neg for bony injury. Will place in knee immobilizer, crutches for comfort, and ask pt to f/u with outpt orthopedics. Return precautions given for new or worsening symptoms including worsening pain, swelling.         Shanna Cisco, MD 05/18/13 1725

## 2013-05-25 ENCOUNTER — Other Ambulatory Visit (HOSPITAL_COMMUNITY): Payer: Self-pay | Admitting: Sports Medicine

## 2013-05-25 DIAGNOSIS — M25562 Pain in left knee: Secondary | ICD-10-CM

## 2013-05-30 ENCOUNTER — Ambulatory Visit (HOSPITAL_COMMUNITY)
Admission: RE | Admit: 2013-05-30 | Discharge: 2013-05-30 | Disposition: A | Discharge status: Home / Self Care | Payer: Medicaid Other | Source: Ambulatory Visit | Attending: Sports Medicine | Admitting: Sports Medicine

## 2013-05-30 DIAGNOSIS — M25569 Pain in unspecified knee: Secondary | ICD-10-CM | POA: Insufficient documentation

## 2013-05-30 DIAGNOSIS — M25562 Pain in left knee: Secondary | ICD-10-CM

## 2013-07-11 ENCOUNTER — Encounter: Payer: Self-pay | Admitting: Cardiology

## 2013-07-11 ENCOUNTER — Encounter: Payer: Self-pay | Admitting: Interventional Cardiology

## 2013-07-11 ENCOUNTER — Ambulatory Visit (HOSPITAL_COMMUNITY): Payer: Medicaid Other | Attending: Cardiology

## 2013-07-11 ENCOUNTER — Ambulatory Visit (INDEPENDENT_AMBULATORY_CARE_PROVIDER_SITE_OTHER): Payer: Medicaid Other | Admitting: Interventional Cardiology

## 2013-07-11 VITALS — BP 110/78 | HR 88 | Ht 59.0 in | Wt 226.0 lb

## 2013-07-11 DIAGNOSIS — M79609 Pain in unspecified limb: Secondary | ICD-10-CM

## 2013-07-11 DIAGNOSIS — I82409 Acute embolism and thrombosis of unspecified deep veins of unspecified lower extremity: Secondary | ICD-10-CM

## 2013-07-11 DIAGNOSIS — I824Z9 Acute embolism and thrombosis of unspecified deep veins of unspecified distal lower extremity: Secondary | ICD-10-CM | POA: Insufficient documentation

## 2013-07-11 DIAGNOSIS — M7989 Other specified soft tissue disorders: Secondary | ICD-10-CM | POA: Insufficient documentation

## 2013-07-11 DIAGNOSIS — I82402 Acute embolism and thrombosis of unspecified deep veins of left lower extremity: Secondary | ICD-10-CM

## 2013-07-11 MED ORDER — RIVAROXABAN (XARELTO) VTE STARTER PACK (15 & 20 MG): ORAL_TABLET | ORAL | Status: DC

## 2013-07-11 MED ORDER — RIVAROXABAN 15 MG PO TABS: 15.0000 mg | ORAL_TABLET | Freq: Two times a day (BID) | ORAL | Status: DC

## 2013-07-11 MED ORDER — RIVAROXABAN 20 MG PO TABS: 20.0000 mg | ORAL_TABLET | Freq: Every day | ORAL | Status: DC

## 2013-07-11 NOTE — Progress Notes (Signed)
Patient ID: Krista [name], female   DOB: 1981-01-13, 32 y.o.   MRN: [id number]     Patient ID: Krista [name] MRN: [id number] DOB/AGE: [birth year] 32 y.o.   Referring Physician Dr. Eulah Pont   Reason for Consultation left leg DVT  HPI: 13 her woman who had left knee surgery several weeks ago. She was immobilized for a few days after the surgery. She had some left calf pain and called Dr. Greig Right office. He sent her for lower extremity venous ultrasound. That was done today and revealed left calf deep vein thrombosis. She is being seen for further management. She is also on birth control pills but she does not smoke. She has no family history of hypercoagulable state that she knows.  She has never had a DVT before.  She has a young son and it would be difficult for her to come into the office for routine monitoring for Coumadin.   Current Outpatient Prescriptions  Medication Sig Dispense Refill  . aspirin 81 MG tablet Take 81 mg by mouth daily.      . celecoxib (CELEBREX) 200 MG capsule Take 200 mg by mouth daily.      Marland Kitchen HYDROcodone-acetaminophen (NORCO) 5-325 MG per tablet Take 1 tablet by mouth every 6 (six) hours as needed for pain.  15 tablet  0  . ibuprofen (ADVIL,MOTRIN) 200 MG tablet Take 600 mg by mouth every 6 (six) hours as needed. For pain       . norethindrone-ethinyl estradiol (LOESTRIN 1/20, 21,) 1-20 MG-MCG tablet Take 1 tablet by mouth daily.      . sertraline (ZOLOFT) 50 MG tablet Take 50 mg by mouth daily.       No current facility-administered medications for this visit.   No past medical history on file.  Family History  Problem Relation Age of Onset  . Hypertension Mother   . Diabetes Mother   . Hypertension Father     History   Social History  . Marital Status: Married    Spouse Name: N/A    Number of Children: N/A  . Years of Education: N/A   Occupational History  . Not on file.   Social History Main Topics  . Smoking status: Never Smoker   .  Smokeless tobacco: Not on file  . Alcohol Use: No  . Drug Use: No  . Sexual Activity:    Other Topics Concern  . Not on file   Social History Narrative  . No narrative on file    Past Surgical History  Procedure Laterality Date  . Cholecystectomy    . Cesarean section        (Not in a hospital admission)  Review of systems complete and found to be negative unless listed above .  No nausea, vomiting.  No fever chills, No focal weakness,  No palpitations.  Physical Exam: Filed Vitals:   07/11/13 1413  BP: 110/78  Pulse: 88    Weight: 226 lb (102.513 kg) (PER PT)  Physical exam:  [place]/AT EOMI No JVD, No carotid bruit RRR S1S2  No wheezing Soft. NT, nondistended Obese abdomen and extremities No focal motor or sensory deficits Normal affect  Labs:   Lab Results  Component Value Date   WBC 9.9 08/25/2010   HGB 9.5 DELTA CHECK NOTED REPEATED TO VERIFY* 08/25/2010   HCT 29.4* 08/25/2010   MCV 88.3 08/25/2010   PLT 242 08/25/2010   No results found for this basename: NA, K, CL, CO2, BUN, CREATININE, CALCIUM,  LABALBU, PROT, BILITOT, ALKPHOS, ALT, AST, GLUCOSE,  in the last 168 hours No results found for this basename: CKTOTAL, CKMB, CKMBINDEX, TROPONINI    No results found for this basename: CHOL   No results found for this basename: HDL   No results found for this basename: LDLCALC   No results found for this basename: TRIG   No results found for this basename: CHOLHDL   No results found for this basename: LDLDIRECT      Radiology: Left calf deep vein thrombosis extending into the popliteal vein  ASSESSMENT AND PLAN:  Deep vein thrombosis: Given her insurance, Xarelto will be covered. We will start Xarelto 15 mg by mouth twice a day for 3 weeks and then 20 mg daily for a total course of 6 months. She will see the pharmacist and be counseled. She will have her hemoglobin and renal function monitored. At this point, I don't think any further hypercoagulable workup as  needed as I think this DVT is most likely related to her surgery.  She will have to stop aspirin and a nonsteroidal anti-inflammatories given that she will be taking Xarelto.  Signed:   Fredric Mare, MD, [facility] 07/11/2013, 2:39 PM

## 2013-07-11 NOTE — Patient Instructions (Addendum)
You will see our Pharmacist today to see which blood thinner would be the best option.  Your physician wants you to follow-up in: 4 months with Dr. Eldridge Dace. You will receive a reminder letter in the mail two months in advance. If you don't receive a letter, please call our office to schedule the follow-up appointment.

## 2013-07-12 ENCOUNTER — Telehealth: Payer: Self-pay | Admitting: Interventional Cardiology

## 2013-07-12 NOTE — Telephone Encounter (Signed)
Lmtrc, per Dr. Hoyle Barr OV note pt will ned to stop ASA and Celebrex.

## 2013-07-12 NOTE — Telephone Encounter (Signed)
Pt will stop ASA and Celebrex until she is finished with Xarelto.

## 2013-07-12 NOTE — Telephone Encounter (Signed)
New Message  Pt husband called-- has questions about 2 medications// Should she continue the Asprin an Celebrex for blood clots // please call back to discuss.

## 2013-07-23 ENCOUNTER — Telehealth: Payer: Self-pay | Admitting: Interventional Cardiology

## 2013-07-23 NOTE — Telephone Encounter (Signed)
Spoke with pt and she is having swelling right below left knee. The area doesn't feel warm to the touch. Pt noticed the swelling last night and she thinks it is new to the area. She is having some tightness at the site with walking. Pt will go back to see the orthopedist on 08/13/13. Pt has taken her Xarelto everyday.

## 2013-07-23 NOTE — Telephone Encounter (Signed)
Per Dr. Eldridge Dace pt should continue Xarelto and the swelling is probably coming from the DVT. Symptoms should get better soon.

## 2013-07-23 NOTE — Telephone Encounter (Signed)
Per Dr. Eldridge Dace pt has no restrictions on walking and she can walk as much as tolerable. Pt should elevate her legs as much as possible to reduce swelling. Pt is aware.

## 2013-07-23 NOTE — Telephone Encounter (Signed)
New message     lft knee is swelling---pt has blood clots and is on xeralto but want to talk to a nurse about the knee swelling

## 2013-07-23 NOTE — Telephone Encounter (Signed)
Pt notified. Pt will continue to monitor. Pt may call her ortho for further recommendations. Pt wonders if she should try to move around a lot or if she elevate her leg as much as possible. She is using ice at the site for the swelling.

## 2013-07-27 ENCOUNTER — Encounter: Payer: Self-pay | Admitting: Interventional Cardiology

## 2013-07-27 ENCOUNTER — Ambulatory Visit (INDEPENDENT_AMBULATORY_CARE_PROVIDER_SITE_OTHER): Payer: Medicaid Other | Admitting: Interventional Cardiology

## 2013-07-27 VITALS — BP 110/80 | HR 80 | Ht 59.0 in | Wt 226.8 lb

## 2013-07-27 DIAGNOSIS — I82409 Acute embolism and thrombosis of unspecified deep veins of unspecified lower extremity: Secondary | ICD-10-CM

## 2013-07-27 DIAGNOSIS — M79609 Pain in unspecified limb: Secondary | ICD-10-CM

## 2013-07-27 DIAGNOSIS — I82402 Acute embolism and thrombosis of unspecified deep veins of left lower extremity: Secondary | ICD-10-CM

## 2013-07-27 NOTE — Patient Instructions (Signed)
Your physician recommends that you follow up as scheduled.  Your physician recommends that you continue on your current medications as directed. Please refer to the Current Medication list given to you today.

## 2013-07-27 NOTE — Progress Notes (Signed)
Patient ID: Krista [name], female   DOB: 07-Dec-1980, 32 y.o.   MRN: [id number] Patient ID: Krista [name], female   DOB: 07/12/1981, 32 y.o.   MRN: [id number]     Patient ID: Krista [name] MRN: [id number] DOB/AGE: 1980/08/13 32 y.o.   Referring Physician Dr. Eulah Pont   Reason for Consultation left leg DVT  HPI: 56 her woman who had left knee surgery several weeks ago. She was immobilized for a few days after the surgery. She had some left calf pain and called Dr. Greig Right office. He sent her for lower extremity venous ultrasound. That was done on 07/11/13 and revealed left calf deep vein thrombosis. She is being seen for further management. She is also on birth control pills but she does not smoke. She has no family history of hypercoagulable state that she knows.  She has never had a DVT before.  She has a young son and it would be difficult for her to come into the office for routine monitoring for Coumadin.  She still has some swelling behind the left knee and pain.  SHe is going to start PT.  No pretibial edema.     Current Outpatient Prescriptions  Medication Sig Dispense Refill  . HYDROcodone-acetaminophen (NORCO) 5-325 MG per tablet Take 1 tablet by mouth every 6 (six) hours as needed for pain.  15 tablet  0  . oxyCODONE-acetaminophen (PERCOCET/ROXICET) 5-325 MG per tablet Take 1-2 tablets by mouth every 4 (four) hours as needed for severe pain.      . Rivaroxaban (XARELTO) 15 MG TABS tablet Take 1 tablet (15 mg total) by mouth 2 (two) times daily with a meal. For 21 days then change to Xarelto 20mg  once daily  42 tablet  0  . Rivaroxaban (XARELTO) 20 MG TABS tablet Take 1 tablet (20 mg total) by mouth daily with supper.  30 tablet  5  . sertraline (ZOLOFT) 50 MG tablet Take 50 mg by mouth daily.       No current facility-administered medications for this visit.   No past medical history on file.  Family History  Problem Relation Age of Onset  . Hypertension Mother   . Diabetes  Mother   . Hypertension Father     History   Social History  . Marital Status: Married    Spouse Name: N/A    Number of Children: N/A  . Years of Education: N/A   Occupational History  . Not on file.   Social History Main Topics  . Smoking status: Never Smoker   . Smokeless tobacco: Not on file  . Alcohol Use: No  . Drug Use: No  . Sexual Activity:    Other Topics Concern  . Not on file   Social History Narrative  . No narrative on file    Past Surgical History  Procedure Laterality Date  . Cholecystectomy    . Cesarean section        (Not in a hospital admission)  Review of systems complete and found to be negative unless listed above .  No nausea, vomiting.  No fever chills, No focal weakness,  No palpitations.  Physical Exam: Filed Vitals:   07/27/13 0957  BP: 110/80  Pulse: 80    Weight: 226 lb 12.8 oz (102.876 kg)  Physical exam:  [place]/AT EOMI No JVD, No carotid bruit RRR S1S2  No wheezing Soft. NT, nondistended Obese abdomen and extremities No focal motor or sensory deficits Normal affect Left calf swelling below  the knee.  No pretibial edema.   Labs:   Lab Results  Component Value Date   WBC 9.9 08/25/2010   HGB 9.5 DELTA CHECK NOTED REPEATED TO VERIFY* 08/25/2010   HCT 29.4* 08/25/2010   MCV 88.3 08/25/2010   PLT 242 08/25/2010   No results found for this basename: NA, K, CL, CO2, BUN, CREATININE, CALCIUM, LABALBU, PROT, BILITOT, ALKPHOS, ALT, AST, GLUCOSE,  in the last 168 hours No results found for this basename: CKTOTAL,  CKMB,  CKMBINDEX,  TROPONINI    No results found for this basename: CHOL   No results found for this basename: HDL   No results found for this basename: LDLCALC   No results found for this basename: TRIG   No results found for this basename: CHOLHDL   No results found for this basename: LDLDIRECT      Radiology: Left calf deep vein thrombosis extending into the popliteal vein  ASSESSMENT AND PLAN:  Deep vein  thrombosis: Given her insurance, Xarelto will be covered. We will start Xarelto 15 mg by mouth twice a day for 3 weeks and then 20 mg daily for a total course of 6 months.At this point, I don't think any further hypercoagulable workup as needed as I think this DVT is most likely related to her surgery.  She had to stop aspirin and a nonsteroidal anti-inflammatories given that she will be taking Xarelto.  WIll let her take her celebrex with food for 2 days while on the Xarelto to see if this helps.  She can use warm compresses on the calf.  OK to do PT.  No more NSAIDs after this.  If pain no better after switching doses of Xarelto, will consider med change.  COumadin would not be more effective due to variability of INR.    Signed:   Fredric Mare, MD, [facility] 07/27/2013, 10:28 AM

## 2013-08-13 ENCOUNTER — Telehealth: Payer: Self-pay | Admitting: Interventional Cardiology

## 2013-08-13 DIAGNOSIS — Z79899 Other long term (current) drug therapy: Secondary | ICD-10-CM

## 2013-08-13 NOTE — Telephone Encounter (Signed)
Can try lasix 20 mg daily prn for swelling.BMet in one week.

## 2013-08-13 NOTE — Telephone Encounter (Signed)
To Dr. Varanasi. 

## 2013-08-13 NOTE — Telephone Encounter (Signed)
New message  The blood clot in her leg is worse. More swelling. Please call patient and advise.

## 2013-08-14 MED ORDER — FUROSEMIDE 20 MG PO TABS: ORAL_TABLET | ORAL | Status: DC

## 2013-08-14 NOTE — Addendum Note (Signed)
Addended byOrlene Plum: Klayten Jolliff H on: 08/14/2013 07:55 AM   Modules accepted: Orders

## 2013-08-14 NOTE — Telephone Encounter (Signed)
Pt notified. Lasix sent in. Bmet scheduled.

## 2013-08-16 ENCOUNTER — Telehealth: Payer: Self-pay | Admitting: Interventional Cardiology

## 2013-08-16 ENCOUNTER — Ambulatory Visit: Payer: Medicaid Other | Attending: Sports Medicine | Admitting: Physical Therapy

## 2013-08-16 DIAGNOSIS — M6281 Muscle weakness (generalized): Secondary | ICD-10-CM | POA: Insufficient documentation

## 2013-08-16 DIAGNOSIS — E669 Obesity, unspecified: Secondary | ICD-10-CM | POA: Insufficient documentation

## 2013-08-16 DIAGNOSIS — R262 Difficulty in walking, not elsewhere classified: Secondary | ICD-10-CM | POA: Insufficient documentation

## 2013-08-16 DIAGNOSIS — Z86718 Personal history of other venous thrombosis and embolism: Secondary | ICD-10-CM | POA: Insufficient documentation

## 2013-08-16 DIAGNOSIS — IMO0001 Reserved for inherently not codable concepts without codable children: Secondary | ICD-10-CM | POA: Insufficient documentation

## 2013-08-16 DIAGNOSIS — J45909 Unspecified asthma, uncomplicated: Secondary | ICD-10-CM | POA: Insufficient documentation

## 2013-08-16 DIAGNOSIS — M25569 Pain in unspecified knee: Secondary | ICD-10-CM | POA: Insufficient documentation

## 2013-08-16 NOTE — Telephone Encounter (Signed)
New Message  Pt called states that her Orthopedic Dr. Prescribed compression stocking.. She is asking if Knee High or Thigh highs are needed. Please call back to discuss

## 2013-08-16 NOTE — Telephone Encounter (Signed)
Pt notified. Pt will try thigh highs first since the DVT was higher in her leg.

## 2013-08-16 NOTE — Telephone Encounter (Signed)
[facility] Dr[facility] Eldridge D[facility]asi either would be fine. It would really be up to the orthopedic doctor that gave the rx to her.

## 2013-08-21 ENCOUNTER — Other Ambulatory Visit (INDEPENDENT_AMBULATORY_CARE_PROVIDER_SITE_OTHER): Payer: Medicaid Other

## 2013-08-21 DIAGNOSIS — Z79899 Other long term (current) drug therapy: Secondary | ICD-10-CM

## 2013-08-21 LAB — BASIC METABOLIC PANEL
BUN: 12 mg/dL (ref 6–23)
CO2: 29 mEq/L (ref 19–32)
Calcium: 9.5 mg/dL (ref 8.4–10.5)
Chloride: 102 mEq/L (ref 96–112)
Creatinine, Ser: 0.7 mg/dL (ref 0.4–1.2)
GFR: 105.34 mL/min (ref >60.00)
GLUCOSE: 91 mg/dL (ref 70–99)
POTASSIUM: 4.6 meq/L (ref 3.5–5.1)
SODIUM: 140 meq/L (ref 135–145)

## 2013-09-25 ENCOUNTER — Encounter: Payer: Medicaid Other | Admitting: Physical Therapy

## 2013-10-08 ENCOUNTER — Telehealth: Payer: Self-pay | Admitting: Interventional Cardiology

## 2013-10-08 NOTE — Telephone Encounter (Signed)
Xarelto would need to be stopped if became pregnant while on this.  Would need to use lovenox instead if pregnancy occurred in near future.  Patient will only need to be on Xarelto for 3-6 months (preferably six months), then wouldn't need to worry about anticoagulation while pregnant.  DVT in 07/2013 following knee surgery.

## 2013-10-08 NOTE — Telephone Encounter (Signed)
To Jeremy, please advise.  

## 2013-10-08 NOTE — Telephone Encounter (Signed)
Please check with PharmD

## 2013-10-08 NOTE — Telephone Encounter (Signed)
To Dr. Varanasi, please advise.  

## 2013-10-08 NOTE — Telephone Encounter (Signed)
New Message  Pt is requesting a call back to discuss pregnancy with the use of Xarelto (pt is not currently pregnant)// Please assist.

## 2013-10-10 NOTE — Telephone Encounter (Signed)
Agree with Jeremy's recs 

## 2013-10-11 NOTE — Telephone Encounter (Signed)
Pt notified. Pt thought she was pregnant, but she just found out she isnt. Pt has follow up 4/15. Pt will call if she becomes pregnant.

## 2013-10-11 NOTE — Telephone Encounter (Signed)
[facility]

## 2013-10-15 ENCOUNTER — Telehealth: Payer: Self-pay | Admitting: Interventional Cardiology

## 2013-10-15 NOTE — Telephone Encounter (Signed)
Pt.notified

## 2013-10-15 NOTE — Telephone Encounter (Signed)
Per Dr. Eldridge DaceVaranasi as long as pt is taking Xarelto she can fly.

## 2013-10-15 NOTE — Telephone Encounter (Signed)
New Prob    Pt would like to know if it would be ok for her to fly given her currently being on Xarelto. Please call.

## 2013-11-14 ENCOUNTER — Telehealth: Payer: Self-pay | Admitting: Interventional Cardiology

## 2013-11-14 DIAGNOSIS — M79606 Pain in leg, unspecified: Secondary | ICD-10-CM

## 2013-11-14 DIAGNOSIS — I82409 Acute embolism and thrombosis of unspecified deep veins of unspecified lower extremity: Secondary | ICD-10-CM

## 2013-11-14 NOTE — Telephone Encounter (Signed)
Per Dr. Eldridge DaceVaranasi refer pt to [facility] for continued pain with DVT.

## 2013-11-14 NOTE — Telephone Encounter (Signed)
New problem   Pt is having pain in left leg where blood clot is. Pt need to speak to nurse,

## 2013-11-14 NOTE — Telephone Encounter (Signed)
Pt notified, referral sent

## 2013-11-15 ENCOUNTER — Telehealth: Payer: Self-pay | Admitting: Interventional Cardiology

## 2013-11-15 NOTE — Telephone Encounter (Signed)
I will route this message to Amy because it is unclear in the pt's chart exactly why pt was referred to [facility] (possibly an OV for leg pain).

## 2013-11-15 NOTE — Telephone Encounter (Signed)
New Message:  Annabelle HarmanDana from [facility] wants to know if Dr. Eldridge DaceVaranasi wanted them to see the pt for a OV or a DVT test. She states the referral was not clear.Marland Kitchen..Marland Kitchen

## 2013-11-16 NOTE — Telephone Encounter (Signed)
Pt already has DVT. She has had a DVT for a few months now. Pt continues to have pain in her legs. Dr. Eldridge DaceVaranasi would like her seen for a consult to establish to see if there is anything that can be done further.

## 2013-11-16 NOTE — Telephone Encounter (Signed)
I spoke with Krista [name] and made her aware of reason for referral.

## 2013-11-21 ENCOUNTER — Telehealth: Payer: Self-pay | Admitting: Interventional Cardiology

## 2013-11-21 NOTE — Telephone Encounter (Signed)
Called pt and [facility] told pt since she was having pain that maybe Dr. Eldridge DaceVaranasi would need to see pt before 12/12/13. I told pt that she needs to see [facility] to be evaluated further. Conintue Xarelto. I will try and get pt seen sooner at [facility].

## 2013-11-21 NOTE — Telephone Encounter (Signed)
Lm with [facility].

## 2013-11-21 NOTE — Telephone Encounter (Signed)
New problem   Pt need to speak to nurse concerning her going to see a vascular doctor on 12/12/13. Please call pt concerning this matter.

## 2013-11-23 ENCOUNTER — Telehealth: Payer: Self-pay | Admitting: Vascular Surgery

## 2013-11-23 NOTE — Telephone Encounter (Signed)
Amy with Dr Waverly FerrariVaranasis office had called to ask if we can get patient in before scheduled appt of 05/06. Unfortunatley, we do not have any openings before that time. I have placed her on the wait/cancelation list, in the event someone cancels their appt prior to 12/12/13.  LM for Amy this am, dpm

## 2013-11-27 ENCOUNTER — Encounter: Payer: Self-pay | Admitting: Vascular Surgery

## 2013-11-27 NOTE — Telephone Encounter (Signed)
Appt with [facility] moved up to tomorrow. Pt aware.

## 2013-11-28 ENCOUNTER — Encounter: Payer: Self-pay | Admitting: Vascular Surgery

## 2013-11-28 ENCOUNTER — Ambulatory Visit (HOSPITAL_COMMUNITY)
Admission: RE | Admit: 2013-11-28 | Discharge: 2013-11-28 | Disposition: A | Discharge status: Home / Self Care | Payer: Medicaid Other | Source: Ambulatory Visit | Attending: Vascular Surgery | Admitting: Vascular Surgery

## 2013-11-28 ENCOUNTER — Ambulatory Visit (INDEPENDENT_AMBULATORY_CARE_PROVIDER_SITE_OTHER): Payer: Medicaid Other | Admitting: Vascular Surgery

## 2013-11-28 ENCOUNTER — Encounter (INDEPENDENT_AMBULATORY_CARE_PROVIDER_SITE_OTHER): Payer: Self-pay

## 2013-11-28 ENCOUNTER — Other Ambulatory Visit: Payer: Self-pay | Admitting: Vascular Surgery

## 2013-11-28 VITALS — BP 130/75 | HR 73 | Ht 59.0 in | Wt 235.0 lb

## 2013-11-28 DIAGNOSIS — M79609 Pain in unspecified limb: Secondary | ICD-10-CM | POA: Insufficient documentation

## 2013-11-28 DIAGNOSIS — I82409 Acute embolism and thrombosis of unspecified deep veins of unspecified lower extremity: Secondary | ICD-10-CM

## 2013-11-28 DIAGNOSIS — I80299 Phlebitis and thrombophlebitis of other deep vessels of unspecified lower extremity: Secondary | ICD-10-CM | POA: Insufficient documentation

## 2013-11-28 NOTE — Progress Notes (Signed)
[facility] [place]  Patient name: Krista [name] MRN: [id number] DOB: 02/15/1981 Sex: female  REASON FOR CONSULT: Left lower extremity pain. Referred by Dr. Eldridge DaceVaranasi.  HPI: Krista [name] is a 33 y.o. female would undergone recent knee surgery of the left lower extremity on 06/28/2013. She complained of some left leg pain and on 07/11/2013, and a venous duplex scan which showed acute thrombus in the paired proximal to mid posterior tibial veins and 2 of the 4 gastrocnemius veins. There was no other evidence of DVT. He was started on Xarelto. She's had some persistent pain in the left leg very similar to the pain she had initially. She does try to elevate her legs some. She's also had some swelling in that left leg. She's had no symptoms on the right side. She is on her feet quite a bit as she has 3 young boys.  Past Medical History  Diagnosis Date  . DVT (deep venous thrombosis)    Family History  Problem Relation Age of Onset  . Hypertension Mother   . Diabetes Mother   . Hypertension Father    SOCIAL HISTORY: History  Substance Use Topics  . Smoking status: Never Smoker   . Smokeless tobacco: Not on file  . Alcohol Use: No   Allergies  Allergen Reactions  . Codeine Hives    Lips and tongue swell  . Prednisone Hives    Lips and tongue swell   Current Outpatient Prescriptions  Medication Sig Dispense Refill  . Rivaroxaban (XARELTO) 20 MG TABS tablet Take 1 tablet (20 mg total) by mouth daily with supper.  30 tablet  5  . sertraline (ZOLOFT) 50 MG tablet Take 50 mg by mouth daily.      . furosemide (LASIX) 20 MG tablet 1 tablet daily prn  30 tablet  6  . HYDROcodone-acetaminophen (NORCO) 5-325 MG per tablet Take 1 tablet by mouth every 6 (six) hours as needed for pain.  15 tablet  0  . oxyCODONE-acetaminophen (PERCOCET/ROXICET) 5-325 MG per tablet Take 1-2 tablets by mouth every 4 (four) hours as needed for severe pain.      . Rivaroxaban (XARELTO) 15  MG TABS tablet Take 1 tablet (15 mg total) by mouth 2 (two) times daily with a meal. For 21 days then change to Xarelto 20mg  once daily  42 tablet  0   No current facility-administered medications for this visit.   REVIEW OF SYSTEMS: Arly.Keller[X ] denotes positive finding; [  ] denotes negative finding  CARDIOVASCULAR:  [ ]  chest pain   [ ]  chest pressure   [ ]  palpitations   [ ]  orthopnea   [ ]  dyspnea on exertion   [ ]  claudication   [ ]  rest pain   [ ]  DVT   [ ]  phlebitis PULMONARY:   [ ]  productive cough   [ ]  asthma   [ ]  wheezing NEUROLOGIC:   [ ]  weakness  [ ]  paresthesias  [ ]  aphasia  [ ]  amaurosis  Arly.Keller[X ] dizziness HEMATOLOGIC:   [ ]  bleeding problems   [ ]  clotting disorders MUSCULOSKELETAL:  [ ]  joint pain   [ ]  joint swelling Arly.Keller[X ] leg swelling GASTROINTESTINAL: [ ]   blood in stool  [ ]   hematemesis GENITOURINARY:  [ ]   dysuria  [ ]   hematuria PSYCHIATRIC:  [ ]  history of major depression INTEGUMENTARY:  [ ]  rashes  [ ]  ulcers CONSTITUTIONAL:  [ ]  fever   [ ]   chills  PHYSICAL EXAM: Filed Vitals:   11/28/13 1414  BP: 130/75  Pulse: 73  Height: 4\' 11"  (1.499 m)  Weight: 235 lb (106.595 kg)  SpO2: 100%   Body mass index is 47.44 kg/(m^2). GENERAL: The patient is a well-nourished female, in no acute distress. The vital signs are documented above. CARDIOVASCULAR: There is a regular rate and rhythm. I do not detect carotid bruits. Both feet are warm and well-perfused. She has mild bilateral lower extremity swelling which is more significant on the left side. PULMONARY: There is good air exchange bilaterally without wheezing or rales. ABDOMEN: Soft and non-tender with normal pitched bowel sounds.  MUSCULOSKELETAL: There are no major deformities or cyanosis. NEUROLOGIC: No focal weakness or paresthesias are detected. SKIN: There are no ulcers or rashes noted. PSYCHIATRIC: The patient has a normal affect.  DATA:  I have independently interpreted her venous duplex scan today. This shows  no evidence of DVT in the left lower extremity. The clot seen in the posterior tibial veins appears to have resolved. The greater saphenous vein and small saphenous vein are compressible without evidence of phlebitis.  MEDICAL ISSUES:  Acute DVT (deep venous thrombosis) She appears to have resolved the DVT in her calf veins on the left. She has been on Xarelto for 4 months and I think discontinuing it after a total of 6 months would be reasonable. We have discussed the importance of intermittent leg elevation. Also encouraged her to avoid prolonged standing. She did take nonsteroidals for pain as needed. I'll be happy to see her back at any time if any new vascular issues arise.    Chuck Hinthristopher S Olusegun Gerstenberger [facility] [place] Beeper: [phone]

## 2013-11-28 NOTE — Assessment & Plan Note (Signed)
She appears to have resolved the DVT in her calf veins on the left. She has been on Xarelto for 4 months and I think discontinuing it after a total of 6 months would be reasonable. We have discussed the importance of intermittent leg elevation. Also encouraged her to avoid prolonged standing. She did take nonsteroidals for pain as needed. I'll be happy to see her back at any time if any new vascular issues arise.

## 2013-12-06 ENCOUNTER — Ambulatory Visit: Payer: Medicaid Other | Admitting: Interventional Cardiology

## 2013-12-12 ENCOUNTER — Encounter: Payer: Medicaid Other | Admitting: Vascular Surgery

## 2013-12-12 ENCOUNTER — Telehealth: Payer: Self-pay | Admitting: Interventional Cardiology

## 2013-12-12 ENCOUNTER — Other Ambulatory Visit (HOSPITAL_COMMUNITY): Payer: Medicaid Other

## 2013-12-12 NOTE — Telephone Encounter (Signed)
New message     Saw Dr Edilia Boickson at the  Vascular center and the DVT is gone, can she stop the xarelto?

## 2013-12-12 NOTE — Telephone Encounter (Signed)
Spoke with pt and per Dr. Adele Danickson's note pt can go off Xarelto after 6 months. Pt started Xarelto on 07/11/13, therefore, stop date will be around 01/09/14. Per Dr. Eldridge DaceVaranasi he is okay with this plan. Pt aware.

## 2014-06-11 ENCOUNTER — Other Ambulatory Visit: Payer: Self-pay | Admitting: Obstetrics and Gynecology

## 2014-06-12 LAB — CYTOLOGY - PAP

## 2014-06-14 ENCOUNTER — Encounter (HOSPITAL_COMMUNITY): Payer: Self-pay

## 2014-06-14 ENCOUNTER — Ambulatory Visit (HOSPITAL_COMMUNITY)
Admission: RE | Admit: 2014-06-14 | Discharge: 2014-06-14 | Disposition: A | Discharge status: Home / Self Care | Payer: BC Managed Care – PPO | Source: Ambulatory Visit | Attending: Obstetrics and Gynecology | Admitting: Obstetrics and Gynecology

## 2014-06-14 DIAGNOSIS — Z315 Encounter for genetic counseling: Secondary | ICD-10-CM | POA: Insufficient documentation

## 2014-06-14 DIAGNOSIS — Z86718 Personal history of other venous thrombosis and embolism: Secondary | ICD-10-CM | POA: Diagnosis not present

## 2014-06-14 DIAGNOSIS — Z3169 Encounter for other general counseling and advice on procreation: Secondary | ICD-10-CM | POA: Insufficient documentation

## 2014-06-14 NOTE — ED Notes (Addendum)
Bp-123/103, P-67, Wt.-231.5lb.   Repeat BP 123/71.

## 2014-06-14 NOTE — Consult Note (Signed)
Maternal Fetal Medicine Consultation  Requesting Provider(s): Harold HedgeJames Tomblin II, MD  Reason for consultation: Hx of DVT, preconception counseling  HPI: Krista [name] is a 33 year-old [id number] seen for preconception counseling due to a history of previous DVT.  She reports that she underwent left knee surgery in 2014 without complications.  Approximately 2 weeks following her surgery, she was diagnosed with a left lower extremity DVT Duplex scan showed "acute thrombus in the paired proximal to mid tibial veins and 2 of 4 gastrocnemius veins".  She did not have a thrombophilia work up and was treated with Gibson RampXeralto for 6 months.  Repeat Duplex Doppler studies showed complete resolution of the clot. She denies any other history of thromboembolism nor does she report a family history of clotting disorders.  Her past OB history is remarkable for a term vaginal delivery in 2005 complicated by preeclampsia, a D&C for a 14 week loss in 2007, a C-section at 38-39 weeks in 2008 complicated by "high blood pressures / preeclampsia and a repeat C-section in 2012 .  She is without complaints today.  OB History: OB History    Gravida Para Term Preterm AB TAB SAB Ectopic Multiple Living   4 3 3  0 1 0 0 0 0 3      PMH:  Past Medical History  Diagnosis Date  . DVT (deep venous thrombosis)     PSH:  Past Surgical History  Procedure Laterality Date  . Cholecystectomy    . Cesarean section    Left knee surgery  Meds: Zoloft 100 mg qD  Allergies:  Allergies  Allergen Reactions  . Codeine Hives    Lips and tongue swell  . Prednisone Hives    Lips and tongue swell   FH:  Family History  Problem Relation Age of Onset  . Hypertension Mother   . Diabetes Mother   . Hypertension Father    Soc:  History   Social History  . Marital Status: Married    Spouse Name: N/A    Number of Children: N/A  . Years of Education: N/A   Occupational History  . Not on file.   Social History Main Topics  .  Smoking status: Never Smoker   . Smokeless tobacco: Not on file  . Alcohol Use: No  . Drug Use: No  . Sexual Activity: Not on file   Other Topics Concern  . Not on file   Social History Narrative    PE:  BP: 123/103, repeat 123/71, 231#, 67  A/P: 1) Hx of previous DVT - given the timing of the blood clot (and location) do not feel that a thrombophilia work up is indicated.  Based on ACOG guidelines, a single VTE with transient risk factors not now present (excluded estrogen related risk factors) and without thrombophilia does not require anticoagulation during pregnancy ("surveillance without anticoagulation").  I would recommend LMWH in a prophylactic dose (Lovenox 40 mg daily) for 6 weeks post partum, particularly if the patient undergoes repeat C-section.         2) Hx of preeclampsia x 2 - would offer baby aspirin after first trimester in future pregnancies based on the [place] preventative task force recommendations for aspirin in pregnancy         3) Advanced maternal age - we briefly discussed increased risk of aneuploidy based on age.  At age 33, the risk for Down syndrome is approximately 1:270 and overall risk for chromosomal abnormalities is approximately 1:200.  Would offer  genetic counseling and testing (amniocentesis vs. Cell free fetal DNA)  Would also recommend folic acid supplementation (low dose) to decrease risk of ONTD when the patient decides to try to conceive.  Thank you for the opportunity to be a part of the care of Krista [name]. Please contact our office if we can be of further assistance.   I spent approximately 30 minutes with this patient with over 50% of time spent in face-to-face counseling.  Alpha GulaPaul Lauri Purdum, MD Maternal Fetal Medicine

## 2014-06-18 ENCOUNTER — Other Ambulatory Visit (HOSPITAL_COMMUNITY): Payer: Self-pay

## 2014-11-19 ENCOUNTER — Other Ambulatory Visit: Payer: Self-pay | Admitting: Obstetrics and Gynecology

## 2014-11-20 LAB — CYTOLOGY - PAP

## 2014-12-03 LAB — US OB FOLLOW UP

## 2014-12-04 ENCOUNTER — Other Ambulatory Visit (HOSPITAL_COMMUNITY): Payer: Self-pay | Admitting: Obstetrics and Gynecology

## 2014-12-04 DIAGNOSIS — Z3689 Encounter for other specified antenatal screening: Secondary | ICD-10-CM

## 2014-12-04 DIAGNOSIS — O09522 Supervision of elderly multigravida, second trimester: Secondary | ICD-10-CM

## 2014-12-04 DIAGNOSIS — Z3A18 18 weeks gestation of pregnancy: Secondary | ICD-10-CM

## 2015-01-07 ENCOUNTER — Encounter (HOSPITAL_COMMUNITY): Payer: Self-pay

## 2015-01-07 ENCOUNTER — Encounter (HOSPITAL_COMMUNITY): Payer: Medicaid Other

## 2015-01-07 ENCOUNTER — Ambulatory Visit (HOSPITAL_COMMUNITY)
Admission: RE | Admit: 2015-01-07 | Discharge: 2015-01-07 | Disposition: A | Discharge status: Home / Self Care | Payer: BLUE CROSS/BLUE SHIELD | Source: Ambulatory Visit | Attending: Obstetrics and Gynecology | Admitting: Obstetrics and Gynecology

## 2015-01-07 ENCOUNTER — Ambulatory Visit (HOSPITAL_COMMUNITY): Payer: Medicaid Other

## 2015-01-07 DIAGNOSIS — Z3A18 18 weeks gestation of pregnancy: Secondary | ICD-10-CM

## 2015-01-07 DIAGNOSIS — Z3A17 17 weeks gestation of pregnancy: Secondary | ICD-10-CM | POA: Diagnosis not present

## 2015-01-07 DIAGNOSIS — Z315 Encounter for genetic counseling: Secondary | ICD-10-CM | POA: Diagnosis present

## 2015-01-07 DIAGNOSIS — O09529 Supervision of elderly multigravida, unspecified trimester: Secondary | ICD-10-CM

## 2015-01-07 DIAGNOSIS — O09299 Supervision of pregnancy with other poor reproductive or obstetric history, unspecified trimester: Secondary | ICD-10-CM | POA: Insufficient documentation

## 2015-01-07 DIAGNOSIS — Z8279 Family history of other congenital malformations, deformations and chromosomal abnormalities: Secondary | ICD-10-CM | POA: Insufficient documentation

## 2015-01-07 DIAGNOSIS — O09522 Supervision of elderly multigravida, second trimester: Secondary | ICD-10-CM

## 2015-01-07 DIAGNOSIS — Z3689 Encounter for other specified antenatal screening: Secondary | ICD-10-CM

## 2015-01-07 DIAGNOSIS — O99210 Obesity complicating pregnancy, unspecified trimester: Secondary | ICD-10-CM

## 2015-01-07 NOTE — Progress Notes (Signed)
Genetic Counseling  High-Risk Gestation Note  Appointment Date:  01/07/2015 Referred By: Harold Hedge, MD Date of Birth:  07/20/1981 Partner:  Krista [name]   Pregnancy History: [id number] Estimated Date of Delivery: 06/13/15 Estimated Gestational Age: [redacted]w[redacted]d Attending: Particia Nearing, MD   Mrs. Krista [name] was seen for genetic counseling because of a maternal age of 34 y.o. and family history of a chromosome duplication.   She was counseled regarding maternal age and the association with risk for chromosome conditions due to nondisjunction with aging of the ova.   We reviewed chromosomes, nondisjunction, and the associated 1 in 141 risk for fetal aneuploidy related to a maternal age of 34 y.o. at [redacted]w[redacted]d gestation.  She was counseled that the risk for aneuploidy decreases as gestational age increases, accounting for those pregnancies which spontaneously abort.  We specifically discussed Down syndrome (trisomy 60), trisomies 57 and 83, and sex chromosome aneuploidies (47,XXX and 47,XXY) including the common features and prognoses of each.   Krista [name] previously had noninvasive prenatal screening (NIPS)/cell free DNA testing ([facility] through [facility] laboratory) facilitated through her OB office. We reviewed that these are within normal limits, showing a less than 1 in 10,000 risk for trisomies 21, 18 and 13, and monosomy X (Turner syndrome).  In addition, the risk for triploidy/vanishing twin and sex chromosome trisomies (47,XXX and 47,XXY) was also low risk.  Krista [name] had cfDNA analysis for 22q11 deletion syndrome, which was also low risk.  We reviewed that this testing identifies > 99% of pregnancies with trisomy 68, trisomy 110, sex chromosome trisomies (47,XXX and 47,XXY), and triploidy. The detection rate for trisomy 18 is 96%.  The detection rate for monosomy X is ~92%.  The false positive rate is <0.1% for all conditions. She understands that this testing does not identify all genetic conditions.     We discussed the diagnostic testing option of amniocentesis for chromosome conditions including risks, benefits, and limitations. We discussed the associated 1 in 300-500 risk for complications, including spontaneous pregnancy loss. Krista [name] stated that she was not interested in amniocentesis in pregnancy, given the associated risk for complications.   Detailed ultrasound was performed today. Visualized fetal anatomy appeared normal. Complete ultrasound results reported separately. She understands that screening tests cannot rule out all birth defects or genetic syndromes. The patient was advised of this limitation and states she still does not want additional testing at this time.   Krista [name] was provided with written information regarding cystic fibrosis (CF) including the carrier frequency and incidence in the Caucasian population, the availability of carrier testing and prenatal diagnosis if indicated.  In addition, we discussed that CF is routinely screened for as part of the  newborn screening panel.  OB medical records indicate that the patient previously had CF carrier screening in 2004, which was within normal limits.    Both family histories were reviewed and found to be contributory for a duplication of chromosome 5 for the couple's youngest son. She reported that this was diagnosed via microarray analysis following feeding issues and delays in meeting milestones. He also had cataracts, which were surgically removed at age 67 years old. She reported that he is currently in a whole exome research study with [facility] to further determine the clinical significance of his chromosome duplication. Krista [name] reported that she and her husband both had chromosome testing which was normal. It was not clear if they had chromosomal microarray testing or were solely tested for the chromosome 5  duplication present in their son. No additional relatives were reported with a chromosome imbalance. We  do not currently have medical records to confirm this report.   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 However, we discussed that recurrence risk for the current pregnancy is increased above the general population risk, given that gonadal mosaicism cannot be ruled out. We reviewed that chromosome microarray analysis could be performed via amniocentesis, if desired. Mrs. Rasco declined amniocentesis. We discussed that postnatal evaluation and microarray testing, if warranted, would be available for the current pregnancy.   Krista [name] reported that their second son has a history of chronic constipation. He had chromosomal microarray testing, which was normal. [facility] is [facility]. Without an identified etiology for his features, recurrence risk estimate cannot be accurately determined for relatives. Without further information regarding the provided family history, an accurate genetic risk cannot be calculated. Further genetic counseling is warranted if more information is obtained.  Krista [name] denied exposure to environmental toxins or chemical agents. She denied the use of alcohol, tobacco or street drugs. She denied significant viral illnesses during the course of her pregnancy. Her reported medical and surgical histories were noncontributory.   I counseled Krista [name] regarding the above risks and available options.  The approximate face-to-face time with the genetic counselor was 25 minutes.  Quinn PlowmanKaren Caliph Borowiak, MS,  Certified Genetic Counselor 01/07/2015

## 2015-01-08 ENCOUNTER — Other Ambulatory Visit (HOSPITAL_COMMUNITY): Payer: Self-pay | Admitting: Obstetrics and Gynecology

## 2015-01-08 ENCOUNTER — Encounter (HOSPITAL_COMMUNITY): Payer: Self-pay | Admitting: Obstetrics and Gynecology

## 2015-03-11 ENCOUNTER — Encounter (HOSPITAL_BASED_OUTPATIENT_CLINIC_OR_DEPARTMENT_OTHER): Payer: Self-pay | Admitting: *Deleted

## 2015-03-11 ENCOUNTER — Emergency Department (HOSPITAL_BASED_OUTPATIENT_CLINIC_OR_DEPARTMENT_OTHER)
Admission: EM | Admit: 2015-03-11 | Discharge: 2015-03-11 | Disposition: A | Discharge status: Home / Self Care | Payer: BLUE CROSS/BLUE SHIELD | Attending: Emergency Medicine | Admitting: Emergency Medicine

## 2015-03-11 ENCOUNTER — Emergency Department (HOSPITAL_BASED_OUTPATIENT_CLINIC_OR_DEPARTMENT_OTHER): Payer: BLUE CROSS/BLUE SHIELD

## 2015-03-11 DIAGNOSIS — M79605 Pain in left leg: Secondary | ICD-10-CM | POA: Diagnosis not present

## 2015-03-11 DIAGNOSIS — Z86718 Personal history of other venous thrombosis and embolism: Secondary | ICD-10-CM | POA: Diagnosis not present

## 2015-03-11 DIAGNOSIS — Z7982 Long term (current) use of aspirin: Secondary | ICD-10-CM | POA: Diagnosis not present

## 2015-03-11 NOTE — ED Notes (Signed)
Pt amb to triage with quick steady gait in nad. Pt reports left leg pain x Saturday, has hx of dvt, states "it feels exactly the same".

## 2015-03-11 NOTE — Discharge Instructions (Signed)

## 2015-03-11 NOTE — ED Provider Notes (Signed)
CSN: [id number]     Arrival date & time 03/11/15  1011 History   First MD Initiated Contact with Patient 03/11/15 1202     Chief Complaint  Patient presents with  . Leg Pain     (Consider location/radiation/quality/duration/timing/severity/associated sxs/prior Treatment) Patient is a 34 y.o. female presenting with leg pain. The history is provided by the patient.  Leg Pain Location:  Knee Time since incident:  1 day Injury: no   Knee location:  L knee Pain details:    Quality:  Aching and dull   Radiates to:  Does not radiate   Severity:  Mild   Onset quality:  Gradual   Duration:  1 day   Timing:  Constant Chronicity:  New Dislocation: no   Foreign body present:  No foreign bodies Prior injury to area:  No Relieved by:  Nothing Worsened by:  Nothing tried Ineffective treatments:  None tried Associated symptoms: stiffness   Associated symptoms: no fever, no numbness and no tingling   Risk factors comment:  Prior DVT   Past Medical History  Diagnosis Date  . DVT (deep venous thrombosis)    Past Surgical History  Procedure Laterality Date  . Cholecystectomy    . Cesarean section     Family History  Problem Relation Age of Onset  . Hypertension Mother   . Diabetes Mother   . Hypertension Father   . Chromosomal disorder Son     duplication chromosome 5   History  Substance Use Topics  . Smoking status: Never Smoker   . Smokeless tobacco: Not on file  . Alcohol Use: No   OB History    Gravida Para Term Preterm AB TAB SAB Ectopic Multiple Living   5 3 3  0 1 0 0 0 0 3     Review of Systems  Constitutional: Negative for fever.  Musculoskeletal: Positive for stiffness.  All other systems reviewed and are negative.     Allergies  Codeine and Prednisone  Home Medications   Prior to Admission medications   Medication Sig Start Date End Date Taking? Authorizing Provider  aspirin 81 MG tablet Take 81 mg by mouth daily.   Yes Historical Provider, MD    BP 120/72 mmHg  Pulse 79  Temp(Src) 98.9 F (37.2 C) (Oral)  Resp 18  Ht 4\' 11"  (1.499 m)  Wt 240 lb (108.863 kg)  BMI 48.45 kg/m2  SpO2 98%  LMP 09/06/2014 Physical Exam  Constitutional: She is oriented to person, place, and time. She appears well-developed and well-nourished. No distress.  HENT:  Head: Normocephalic.  Eyes: Conjunctivae are normal.  Neck: Neck supple. No tracheal deviation present.  Cardiovascular: Normal rate and regular rhythm.   Pulmonary/Chest: Effort normal. No respiratory distress.  Abdominal: Soft. She exhibits no distension.  Musculoskeletal:       Left knee: She exhibits normal range of motion, no swelling and normal alignment. Tenderness (popliteal area, mild) found.  Neurological: She is alert and oriented to person, place, and time.  Skin: Skin is warm and dry.  Psychiatric: She has a normal mood and affect.    ED Course  Procedures (including critical care time) Labs Review Labs Reviewed - No data to display  Imaging Review US Venous Img Lower Unilateral Left  03/11/2015   CLINICAL DATA:  Left leg pain for 4 days. Pregnancy. Prior history of DVT.  EXAM: LEFT LOWER EXTREMITY VENOUS DOPPLER ULTRASOUND  TECHNIQUE: Gray-scale sonography with graded compression, as well as color Doppler and  duplex ultrasound were performed to evaluate the lower extremity deep venous systems from the level of the common femoral vein and including the common femoral, femoral, profunda femoral, popliteal and calf veins including the posterior tibial, peroneal and gastrocnemius veins when visible. The superficial great saphenous vein was also interrogated. Spectral Doppler was utilized to evaluate flow at rest and with distal augmentation maneuvers in the common femoral, femoral and popliteal veins.  COMPARISON:  None.  FINDINGS: Contralateral Common Femoral Vein: Respiratory phasicity is normal and symmetric with the symptomatic side. No evidence of thrombus. Normal  compressibility.  Common Femoral Vein: No evidence of thrombus. Normal compressibility, respiratory phasicity and response to augmentation.  Saphenofemoral Junction: No evidence of thrombus. Normal compressibility and flow on color Doppler imaging.  Profunda Femoral Vein: No evidence of thrombus. Normal compressibility and flow on color Doppler imaging.  Femoral Vein: No evidence of thrombus. Normal compressibility, respiratory phasicity and response to augmentation.  Popliteal Vein: No evidence of thrombus. Normal compressibility, respiratory phasicity and response to augmentation.  Calf Veins: No evidence of thrombus. Normal compressibility and flow on color Doppler imaging.  Superficial Great Saphenous Vein: No evidence of thrombus. Normal compressibility and flow on color Doppler imaging.  Venous Reflux:  None.  Other Findings: This exam was limited due to patient's body habitus.  IMPRESSION: No evidence of deep venous thrombosis.   Electronically Signed   By: Maisie Fus  Register   On: 03/11/2015 13:43   I independently viewed and interpreted the above radiology studies and agree with radiologist report.   EKG Interpretation None      MDM   Final diagnoses:  Left leg pain   34 year old female presents with left leg pain that is similar to her prior history of DVT for which she completed therapy with Xarelto. She is currently pregnant and has an elevated risk for recurrent thrombosis so a left lower extremity vascular study was ordered to evaluate for acute DVT. No acute clinical signs of DVT currently.  Study is negative, will recommend follow up with primary care physician for repeat studies if symptoms worsen.  Lyndal Pulley, MD 03/11/15 [phone]

## 2015-03-11 NOTE — ED Notes (Signed)
Patient transported to Ultrasound 

## 2015-03-11 NOTE — ED Notes (Signed)
Pt is in u/s at this time.

## 2015-05-18 ENCOUNTER — Encounter (HOSPITAL_COMMUNITY)
Admission: AD | Disposition: A | Discharge status: Home / Self Care | Payer: Self-pay | Source: Ambulatory Visit | Attending: Obstetrics and Gynecology

## 2015-05-18 ENCOUNTER — Inpatient Hospital Stay (HOSPITAL_COMMUNITY): Payer: BLUE CROSS/BLUE SHIELD | Admitting: Certified Registered Nurse Anesthetist

## 2015-05-18 ENCOUNTER — Inpatient Hospital Stay (HOSPITAL_COMMUNITY)
Admission: AD | Admit: 2015-05-18 | Discharge: 2015-05-21 | DRG: 765 | Disposition: A | Discharge status: Home / Self Care | Payer: BLUE CROSS/BLUE SHIELD | Source: Ambulatory Visit | Attending: Obstetrics and Gynecology | Admitting: Obstetrics and Gynecology

## 2015-05-18 ENCOUNTER — Encounter (HOSPITAL_COMMUNITY): Payer: Self-pay | Admitting: *Deleted

## 2015-05-18 DIAGNOSIS — J45909 Unspecified asthma, uncomplicated: Secondary | ICD-10-CM | POA: Diagnosis present

## 2015-05-18 DIAGNOSIS — Z3A36 36 weeks gestation of pregnancy: Secondary | ICD-10-CM | POA: Diagnosis not present

## 2015-05-18 DIAGNOSIS — O34219 Maternal care for unspecified type scar from previous cesarean delivery: Secondary | ICD-10-CM | POA: Diagnosis present

## 2015-05-18 DIAGNOSIS — O99214 Obesity complicating childbirth: Secondary | ICD-10-CM | POA: Diagnosis present

## 2015-05-18 DIAGNOSIS — O9952 Diseases of the respiratory system complicating childbirth: Secondary | ICD-10-CM | POA: Diagnosis present

## 2015-05-18 DIAGNOSIS — O42913 Preterm premature rupture of membranes, unspecified as to length of time between rupture and onset of labor, third trimester: Secondary | ICD-10-CM | POA: Diagnosis present

## 2015-05-18 DIAGNOSIS — Z6841 Body Mass Index (BMI) 40.0 and over, adult: Secondary | ICD-10-CM

## 2015-05-18 DIAGNOSIS — O09293 Supervision of pregnancy with other poor reproductive or obstetric history, third trimester: Secondary | ICD-10-CM

## 2015-05-18 DIAGNOSIS — Z86718 Personal history of other venous thrombosis and embolism: Secondary | ICD-10-CM | POA: Diagnosis not present

## 2015-05-18 DIAGNOSIS — Z9049 Acquired absence of other specified parts of digestive tract: Secondary | ICD-10-CM

## 2015-05-18 DIAGNOSIS — Z98891 History of uterine scar from previous surgery: Secondary | ICD-10-CM

## 2015-05-18 DIAGNOSIS — O09529 Supervision of elderly multigravida, unspecified trimester: Secondary | ICD-10-CM

## 2015-05-18 HISTORY — DX: Obesity, unspecified: E66.9

## 2015-05-18 HISTORY — DX: Unspecified asthma, uncomplicated: J45.909

## 2015-05-18 LAB — CBC
HCT: 32.8 % — ABNORMAL LOW (ref 36.0–46.0)
HEMATOCRIT: 29.4 % — AB (ref 36.0–46.0)
HEMOGLOBIN: 9.6 g/dL — AB (ref 12.0–15.0)
Hemoglobin: 10.8 g/dL — ABNORMAL LOW (ref 12.0–15.0)
MCH: 29 pg (ref 26.0–34.0)
MCH: 28.9 pg (ref 26.0–34.0)
MCHC: 32.9 g/dL (ref 30.0–36.0)
MCHC: 32.7 g/dL (ref 30.0–36.0)
MCV: 88.2 fL (ref 78.0–100.0)
MCV: 88.6 fL (ref 78.0–100.0)
PLATELETS: 305 10*3/uL (ref 150–400)
Platelets: 283 10*3/uL (ref 150–400)
RBC: 3.72 MIL/uL — AB (ref 3.87–5.11)
RBC: 3.32 MIL/uL — ABNORMAL LOW (ref 3.87–5.11)
RDW: 14.8 % (ref 11.5–15.5)
RDW: 14.9 % (ref 11.5–15.5)
WBC: 11.5 10*3/uL — ABNORMAL HIGH (ref 4.0–10.5)
WBC: 13.6 10*3/uL — AB (ref 4.0–10.5)

## 2015-05-18 LAB — CREATININE, SERUM: Creatinine, Ser: 0.49 mg/dL (ref 0.44–1.00)

## 2015-05-18 LAB — TYPE AND SCREEN
ABO/RH(D): A POS
Antibody Screen: NEGATIVE

## 2015-05-18 LAB — ABO/RH: ABO/RH(D): A POS

## 2015-05-18 LAB — POCT FERN TEST: POCT Fern Test: POSITIVE

## 2015-05-18 SURGERY — Surgical Case
Anesthesia: Spinal

## 2015-05-18 MED ORDER — NALBUPHINE HCL 10 MG/ML IJ SOLN: 5.0000 mg | INTRAMUSCULAR | Status: DC | PRN

## 2015-05-18 MED ORDER — NALBUPHINE HCL 10 MG/ML IJ SOLN: 5.0000 mg | Freq: Once | INTRAMUSCULAR | Status: DC | PRN

## 2015-05-18 MED ORDER — KETOROLAC TROMETHAMINE 30 MG/ML IJ SOLN: 30.0000 mg | Freq: Four times a day (QID) | INTRAMUSCULAR | Status: DC | PRN

## 2015-05-18 MED ORDER — LACTATED RINGERS IV SOLN
INTRAVENOUS | Status: DC
  Administered 2015-05-18 (×3): via INTRAVENOUS

## 2015-05-18 MED ORDER — SENNOSIDES-DOCUSATE SODIUM 8.6-50 MG PO TABS
2.0000 | ORAL_TABLET | ORAL | Status: DC
  Administered 2015-05-19: 2 via ORAL
  Administered 2015-05-21: 1 via ORAL
  Filled 2015-05-18 (×2): qty 2

## 2015-05-18 MED ORDER — ONDANSETRON HCL 4 MG/2ML IJ SOLN
INTRAMUSCULAR | Status: AC
  Filled 2015-05-18: qty 2

## 2015-05-18 MED ORDER — SCOPOLAMINE 1 MG/3DAYS TD PT72
MEDICATED_PATCH | TRANSDERMAL | Status: AC
  Filled 2015-05-18: qty 1

## 2015-05-18 MED ORDER — DIBUCAINE 1 % RE OINT: 1.0000 "application " | TOPICAL_OINTMENT | RECTAL | Status: DC | PRN

## 2015-05-18 MED ORDER — ACETAMINOPHEN 500 MG PO TABS: 1000.0000 mg | ORAL_TABLET | Freq: Four times a day (QID) | ORAL | Status: DC

## 2015-05-18 MED ORDER — HYDROMORPHONE HCL 1 MG/ML IJ SOLN: 0.2500 mg | INTRAMUSCULAR | Status: DC | PRN

## 2015-05-18 MED ORDER — OXYCODONE-ACETAMINOPHEN 5-325 MG PO TABS
1.0000 | ORAL_TABLET | ORAL | Status: DC | PRN
  Administered 2015-05-19 – 2015-05-21 (×7): 1 via ORAL
  Filled 2015-05-18 (×7): qty 1

## 2015-05-18 MED ORDER — DIPHENHYDRAMINE HCL 50 MG/ML IJ SOLN: 12.5000 mg | INTRAMUSCULAR | Status: DC | PRN

## 2015-05-18 MED ORDER — NALOXONE HCL 0.4 MG/ML IJ SOLN: 0.4000 mg | INTRAMUSCULAR | Status: DC | PRN

## 2015-05-18 MED ORDER — MENTHOL 3 MG MT LOZG: 1.0000 | LOZENGE | OROMUCOSAL | Status: DC | PRN

## 2015-05-18 MED ORDER — OXYTOCIN 10 UNIT/ML IJ SOLN
INTRAMUSCULAR | Status: AC
  Filled 2015-05-18: qty 4

## 2015-05-18 MED ORDER — IBUPROFEN 600 MG PO TABS: 600.0000 mg | ORAL_TABLET | Freq: Four times a day (QID) | ORAL | Status: DC

## 2015-05-18 MED ORDER — SODIUM CHLORIDE 0.9 % IV SOLN: 250.0000 mL | INTRAVENOUS | Status: DC

## 2015-05-18 MED ORDER — MEPERIDINE HCL 25 MG/ML IJ SOLN: 6.2500 mg | INTRAMUSCULAR | Status: DC | PRN

## 2015-05-18 MED ORDER — LACTATED RINGERS IV SOLN
INTRAVENOUS | Status: DC
  Administered 2015-05-19: 06:00:00 via INTRAVENOUS

## 2015-05-18 MED ORDER — CEFAZOLIN SODIUM-DEXTROSE 2-3 GM-% IV SOLR
2.0000 g | INTRAVENOUS | Status: AC
  Administered 2015-05-18: 2 g via INTRAVENOUS
  Filled 2015-05-18: qty 50

## 2015-05-18 MED ORDER — SODIUM CHLORIDE 0.9 % IJ SOLN: 3.0000 mL | INTRAMUSCULAR | Status: DC | PRN

## 2015-05-18 MED ORDER — SODIUM CHLORIDE 0.9 % IR SOLN
Status: DC | PRN
  Administered 2015-05-18: 1000 mL

## 2015-05-18 MED ORDER — OXYCODONE-ACETAMINOPHEN 5-325 MG PO TABS
2.0000 | ORAL_TABLET | ORAL | Status: DC | PRN
  Administered 2015-05-19 – 2015-05-20 (×2): 2 via ORAL
  Filled 2015-05-18 (×2): qty 2

## 2015-05-18 MED ORDER — PHENYLEPHRINE 8 MG IN D5W 100 ML (0.08MG/ML) PREMIX OPTIME
INJECTION | INTRAVENOUS | Status: AC
  Filled 2015-05-18: qty 100

## 2015-05-18 MED ORDER — DIPHENHYDRAMINE HCL 25 MG PO CAPS: 25.0000 mg | ORAL_CAPSULE | Freq: Four times a day (QID) | ORAL | Status: DC | PRN

## 2015-05-18 MED ORDER — SCOPOLAMINE 1 MG/3DAYS TD PT72
MEDICATED_PATCH | TRANSDERMAL | Status: DC | PRN
  Administered 2015-05-18: 1 via TRANSDERMAL

## 2015-05-18 MED ORDER — FENTANYL CITRATE (PF) 100 MCG/2ML IJ SOLN
INTRAMUSCULAR | Status: DC | PRN
  Administered 2015-05-18: 15 ug via INTRATHECAL

## 2015-05-18 MED ORDER — BUPIVACAINE IN DEXTROSE 0.75-8.25 % IT SOLN
INTRATHECAL | Status: DC | PRN
Start: 2015-05-18 — End: 2015-05-18
  Administered 2015-05-18: 1.6 mL via INTRATHECAL

## 2015-05-18 MED ORDER — SIMETHICONE 80 MG PO CHEW: 80.0000 mg | CHEWABLE_TABLET | ORAL | Status: DC | PRN

## 2015-05-18 MED ORDER — MORPHINE SULFATE (PF) 0.5 MG/ML IJ SOLN
INTRAMUSCULAR | Status: DC | PRN
  Administered 2015-05-18: .2 mg via INTRATHECAL

## 2015-05-18 MED ORDER — DEXTROSE 5 % IV SOLN: 3.0000 g | INTRAVENOUS | Status: DC

## 2015-05-18 MED ORDER — FAMOTIDINE IN NACL 20-0.9 MG/50ML-% IV SOLN
20.0000 mg | Freq: Once | INTRAVENOUS | Status: AC
  Administered 2015-05-18: 20 mg via INTRAVENOUS
  Filled 2015-05-18: qty 50

## 2015-05-18 MED ORDER — CEFAZOLIN SODIUM-DEXTROSE 2-3 GM-% IV SOLR
INTRAVENOUS | Status: AC
  Filled 2015-05-18: qty 50

## 2015-05-18 MED ORDER — IBUPROFEN 600 MG PO TABS: 600.0000 mg | ORAL_TABLET | Freq: Four times a day (QID) | ORAL | Status: DC | PRN

## 2015-05-18 MED ORDER — SIMETHICONE 80 MG PO CHEW
80.0000 mg | CHEWABLE_TABLET | Freq: Three times a day (TID) | ORAL | Status: DC
  Administered 2015-05-19 – 2015-05-21 (×7): 80 mg via ORAL
  Filled 2015-05-18 (×7): qty 1

## 2015-05-18 MED ORDER — KETOROLAC TROMETHAMINE 30 MG/ML IJ SOLN: 30.0000 mg | Freq: Once | INTRAMUSCULAR | Status: DC | PRN

## 2015-05-18 MED ORDER — SODIUM CHLORIDE 0.9 % IJ SOLN
3.0000 mL | Freq: Two times a day (BID) | INTRAMUSCULAR | Status: DC
Start: 2015-05-19 — End: 2015-05-21

## 2015-05-18 MED ORDER — ZOLPIDEM TARTRATE 5 MG PO TABS: 5.0000 mg | ORAL_TABLET | Freq: Every evening | ORAL | Status: DC | PRN

## 2015-05-18 MED ORDER — FENTANYL CITRATE (PF) 100 MCG/2ML IJ SOLN
INTRAMUSCULAR | Status: AC
  Filled 2015-05-18: qty 4

## 2015-05-18 MED ORDER — LACTATED RINGERS IV BOLUS (SEPSIS)
1000.0000 mL | Freq: Once | INTRAVENOUS | Status: AC
  Administered 2015-05-18: 1000 mL via INTRAVENOUS

## 2015-05-18 MED ORDER — PRENATAL MULTIVITAMIN CH
1.0000 | ORAL_TABLET | Freq: Every day | ORAL | Status: DC
  Administered 2015-05-19 – 2015-05-20 (×2): 1 via ORAL
  Filled 2015-05-18 (×2): qty 1

## 2015-05-18 MED ORDER — DIPHENHYDRAMINE HCL 25 MG PO CAPS
25.0000 mg | ORAL_CAPSULE | ORAL | Status: DC | PRN
  Filled 2015-05-18: qty 1

## 2015-05-18 MED ORDER — FLEET ENEMA 7-19 GM/118ML RE ENEM: 1.0000 | ENEMA | Freq: Every day | RECTAL | Status: DC | PRN

## 2015-05-18 MED ORDER — PROMETHAZINE HCL 25 MG/ML IJ SOLN: 6.2500 mg | INTRAMUSCULAR | Status: DC | PRN

## 2015-05-18 MED ORDER — ONDANSETRON HCL 4 MG/2ML IJ SOLN: 4.0000 mg | Freq: Three times a day (TID) | INTRAMUSCULAR | Status: DC | PRN

## 2015-05-18 MED ORDER — BISACODYL 10 MG RE SUPP: 10.0000 mg | Freq: Every day | RECTAL | Status: DC | PRN

## 2015-05-18 MED ORDER — TETANUS-DIPHTH-ACELL PERTUSSIS 5-2.5-18.5 LF-MCG/0.5 IM SUSP: 0.5000 mL | Freq: Once | INTRAMUSCULAR | Status: DC

## 2015-05-18 MED ORDER — PHENYLEPHRINE 8 MG IN D5W 100 ML (0.08MG/ML) PREMIX OPTIME
INJECTION | INTRAVENOUS | Status: DC | PRN
  Administered 2015-05-18: 60 ug/min via INTRAVENOUS

## 2015-05-18 MED ORDER — KETOROLAC TROMETHAMINE 30 MG/ML IJ SOLN
30.0000 mg | Freq: Four times a day (QID) | INTRAMUSCULAR | Status: DC | PRN
  Administered 2015-05-18: 30 mg via INTRAMUSCULAR

## 2015-05-18 MED ORDER — CITRIC ACID-SODIUM CITRATE 334-500 MG/5ML PO SOLN
30.0000 mL | Freq: Once | ORAL | Status: AC
Start: 2015-05-18 — End: 2015-05-18
  Administered 2015-05-18: 30 mL via ORAL
  Filled 2015-05-18: qty 15

## 2015-05-18 MED ORDER — LACTATED RINGERS IV SOLN
INTRAVENOUS | Status: DC | PRN
  Administered 2015-05-18: 14:00:00 via INTRAVENOUS

## 2015-05-18 MED ORDER — WITCH HAZEL-GLYCERIN EX PADS: 1.0000 "application " | MEDICATED_PAD | CUTANEOUS | Status: DC | PRN

## 2015-05-18 MED ORDER — OXYTOCIN 10 UNIT/ML IJ SOLN
40.0000 [IU] | INTRAVENOUS | Status: DC | PRN
  Administered 2015-05-18: 40 [IU] via INTRAVENOUS

## 2015-05-18 MED ORDER — SCOPOLAMINE 1 MG/3DAYS TD PT72: 1.0000 | MEDICATED_PATCH | Freq: Once | TRANSDERMAL | Status: DC

## 2015-05-18 MED ORDER — ENOXAPARIN SODIUM 40 MG/0.4ML ~~LOC~~ SOLN
40.0000 mg | SUBCUTANEOUS | Status: DC
  Administered 2015-05-18 – 2015-05-20 (×3): 40 mg via SUBCUTANEOUS
  Filled 2015-05-18 (×3): qty 0.4

## 2015-05-18 MED ORDER — KETOROLAC TROMETHAMINE 30 MG/ML IJ SOLN
INTRAMUSCULAR | Status: AC
  Administered 2015-05-18: 30 mg via INTRAMUSCULAR
  Filled 2015-05-18: qty 1

## 2015-05-18 MED ORDER — ACETAMINOPHEN 325 MG PO TABS
650.0000 mg | ORAL_TABLET | ORAL | Status: DC | PRN
  Administered 2015-05-21: 650 mg via ORAL
  Filled 2015-05-18: qty 2

## 2015-05-18 MED ORDER — OXYTOCIN 40 UNITS IN LACTATED RINGERS INFUSION - SIMPLE MED: 62.5000 mL/h | INTRAVENOUS | Status: AC

## 2015-05-18 MED ORDER — ONDANSETRON HCL 4 MG/2ML IJ SOLN
INTRAMUSCULAR | Status: DC | PRN
  Administered 2015-05-18: 4 mg via INTRAVENOUS

## 2015-05-18 MED ORDER — MORPHINE SULFATE (PF) 0.5 MG/ML IJ SOLN
INTRAMUSCULAR | Status: AC
  Filled 2015-05-18: qty 100

## 2015-05-18 MED ORDER — SIMETHICONE 80 MG PO CHEW
80.0000 mg | CHEWABLE_TABLET | ORAL | Status: DC
  Administered 2015-05-19 – 2015-05-21 (×3): 80 mg via ORAL
  Filled 2015-05-18 (×3): qty 1

## 2015-05-18 MED ORDER — LANOLIN HYDROUS EX OINT: 1.0000 "application " | TOPICAL_OINTMENT | CUTANEOUS | Status: DC | PRN

## 2015-05-18 MED ORDER — NALOXONE HCL 1 MG/ML IJ SOLN
1.0000 ug/kg/h | INTRAVENOUS | Status: DC | PRN
  Filled 2015-05-18: qty 2

## 2015-05-18 SURGICAL SUPPLY — 34 items
APL SKNCLS STERI-STRIP NONHPOA (GAUZE/BANDAGES/DRESSINGS)
BENZOIN TINCTURE PRP APPL 2/3 (GAUZE/BANDAGES/DRESSINGS) IMPLANT
CLAMP CORD UMBIL (MISCELLANEOUS) IMPLANT
CLOTH BEACON ORANGE TIMEOUT ST (SAFETY) ×2 IMPLANT
CONTAINER PREFILL 10% NBF 15ML (MISCELLANEOUS) IMPLANT
DRAPE SHEET LG 3/4 BI-LAMINATE (DRAPES) IMPLANT
DRSG OPSITE POSTOP 4X10 (GAUZE/BANDAGES/DRESSINGS) ×2 IMPLANT
DURAPREP 26ML APPLICATOR (WOUND CARE) ×2 IMPLANT
ELECT REM PT RETURN 9FT ADLT (ELECTROSURGICAL) ×2
ELECTRODE REM PT RTRN 9FT ADLT (ELECTROSURGICAL) ×1 IMPLANT
EXTRACTOR VACUUM M CUP 4 TUBE (SUCTIONS) IMPLANT
GLOVE BIO SURGEON STRL SZ8 (GLOVE) ×2 IMPLANT
GOWN STRL REUS W/TWL LRG LVL3 (GOWN DISPOSABLE) ×4 IMPLANT
KIT ABG SYR 3ML LUER SLIP (SYRINGE) ×2 IMPLANT
LIQUID BAND (GAUZE/BANDAGES/DRESSINGS) ×1 IMPLANT
NDL HYPO 25X5/8 SAFETYGLIDE (NEEDLE) ×1 IMPLANT
NEEDLE HYPO 25X5/8 SAFETYGLIDE (NEEDLE) ×2 IMPLANT
NS IRRIG 1000ML POUR BTL (IV SOLUTION) ×2 IMPLANT
PACK C SECTION WH (CUSTOM PROCEDURE TRAY) ×2 IMPLANT
PAD ABD 7.5X8 STRL (GAUZE/BANDAGES/DRESSINGS) ×2 IMPLANT
PAD OB MATERNITY 4.3X12.25 (PERSONAL CARE ITEMS) ×2 IMPLANT
PENCIL SMOKE EVAC W/HOLSTER (ELECTROSURGICAL) ×2 IMPLANT
STRIP CLOSURE SKIN 1/2X4 (GAUZE/BANDAGES/DRESSINGS) IMPLANT
SUT MNCRL 0 VIOLET CTX 36 (SUTURE) ×4 IMPLANT
SUT MONOCRYL 0 CTX 36 (SUTURE) ×6
SUT PDS AB 0 CTX 60 (SUTURE) ×2 IMPLANT
SUT PLAIN 0 NONE (SUTURE) IMPLANT
SUT PLAIN 2 0 (SUTURE)
SUT PLAIN 2 0 XLH (SUTURE) ×1 IMPLANT
SUT PLAIN ABS 2-0 CT1 27XMFL (SUTURE) IMPLANT
SUT VIC AB 4-0 KS 27 (SUTURE) ×2 IMPLANT
SYR 30ML LL (SYRINGE) ×2 IMPLANT
TOWEL OR 17X24 6PK STRL BLUE (TOWEL DISPOSABLE) ×2 IMPLANT
TRAY FOLEY CATH SILVER 14FR (SET/KITS/TRAYS/PACK) ×2 IMPLANT

## 2015-05-18 NOTE — Brief Op Note (Signed)
05/18/2015  2:19 PM  PATIENT:  Krista [name]  34 y.o. female  PRE-OPERATIVE DIAGNOSIS:  previous X 2  POST-OPERATIVE DIAGNOSIS:  previous c/s x2/ rupture of membranes  PROCEDURE:  Procedure(s) with comments: CESAREAN SECTION (N/A) - Repeat edc 06/13/15 allergies to codeine, prednisone, DHA supplements  SURGEON:  Surgeon(s) and Role:    * Harold Hedge, MD - Primary    * Richardean Chimera, MD  PHYSICIAN ASSISTANT:   ASSISTANTS: none   ANESTHESIA:   spinal  EBL:  Total I/O In: 2000 [I.V.:2000] Out: 700 [Blood:700]  BLOOD ADMINISTERED:none  DRAINS: Urinary Catheter (Foley)   LOCAL MEDICATIONS USED:  NONE  SPECIMEN:  No Specimen  DISPOSITION OF SPECIMEN:  N/A  COUNTS:  YES  TOURNIQUET:  * No tourniquets in log *  DICTATION: .Other Dictation: Dictation Number [id number]  PLAN OF CARE: Admit to inpatient   PATIENT DISPOSITION:  PACU - hemodynamically stable.   Delay start of Pharmacological VTE agent (>24hrs) due to surgical blood loss or risk of bleeding: no

## 2015-05-18 NOTE — Anesthesia Procedure Notes (Signed)
Spinal Patient location during procedure: OR Start time: 05/18/2015 1:10 PM End time: 05/18/2015 1:19 PM Staffing Anesthesiologist: Leilani Able Performed by: anesthesiologist  Preanesthetic Checklist Completed: patient identified, surgical consent, pre-op evaluation, timeout performed, IV checked, risks and benefits discussed and monitors and equipment checked Spinal Block Patient position: sitting Prep: DuraPrep Patient monitoring: heart rate, cardiac monitor, continuous pulse ox and blood pressure Approach: midline Location: L3-4 Injection technique: single-shot Needle Needle type: Sprotte and Pencan  Needle gauge: 24 G Needle length: 9 cm Needle insertion depth: 7 cm Assessment Sensory level: T4 Additional Notes Needed 18G Tuohy X 1 to 6cm followed by Sprotte to +CSF.

## 2015-05-18 NOTE — Transfer of Care (Signed)
Immediate Anesthesia Transfer of Care Note  Patient: Krista [name]  Procedure(s) Performed: Procedure(s) with comments: CESAREAN SECTION (N/A) - Repeat edc 06/13/15 allergies to codeine, prednisone, DHA supplements  Patient Location: PACU  Anesthesia Type:Spinal  Level of Consciousness: awake, alert , oriented and patient cooperative  Airway & Oxygen Therapy: Patient Spontanous Breathing  Post-op Assessment: Report given to RN and Post -op Vital signs reviewed and stable  Post vital signs: Reviewed and stable  Last Vitals:  Filed Vitals:   05/18/15 1230  BP:   Pulse:   Temp: 37.1 C  Resp:     Complications: No apparent anesthesia complications

## 2015-05-18 NOTE — MAU Note (Signed)
Pt states she had gush of fluid @ 0930 that soaked her underwear, has changed underwear four times since then, is now wearing a towel.  Has had irregular uc's during the night, denies bleeding.  Pt is scheduled C/S.

## 2015-05-18 NOTE — Progress Notes (Signed)
Attempted to walk patient but unable to at this time due to patient becoming dizzy. Will attempt to ambulate patient later.

## 2015-05-18 NOTE — Anesthesia Preprocedure Evaluation (Signed)
Anesthesia Evaluation  Patient identified by MRN, date of birth, ID band Patient awake    Reviewed: Allergy & Precautions, H&P , NPO status , Patient's Chart, lab work & pertinent test results  Airway Mallampati: III  TM Distance: >3 FB Neck ROM: full    Dental no notable dental hx.    Pulmonary neg pulmonary ROS,    Pulmonary exam normal        Cardiovascular negative cardio ROS Normal cardiovascular exam     Neuro/Psych negative neurological ROS  negative psych ROS   GI/Hepatic negative GI ROS, Neg liver ROS,   Endo/Other  Morbid obesity  Renal/GU negative Renal ROS     Musculoskeletal   Abdominal (+) + obese,   Peds  Hematology negative hematology ROS (+)   Anesthesia Other Findings   Reproductive/Obstetrics (+) Pregnancy                             Anesthesia Physical Anesthesia Plan  ASA: III  Anesthesia Plan: Combined Spinal and Epidural   Post-op Pain Management:    Induction:   Airway Management Planned:   Additional Equipment:   Intra-op Plan:   Post-operative Plan:   Informed Consent: I have reviewed the patients History and Physical, chart, labs and discussed the procedure including the risks, benefits and alternatives for the proposed anesthesia with the patient or authorized representative who has indicated his/her understanding and acceptance.     Plan Discussed with: CRNA and Surgeon  Anesthesia Plan Comments:         Anesthesia Quick Evaluation

## 2015-05-18 NOTE — Op Note (Signed)
NAMEMarland [name]  CHAUNDA, VANDERGRIFF NO.:  [id number]  MEDICAL RECORD NO.:  [id number]  LOCATION:  9102                          FACILITY:  [facility]  PHYSICIAN:  Juluis Mire, M.D.   DATE OF BIRTH:  Dec 13, 1980  DATE OF PROCEDURE:  05/18/2015 DATE OF DISCHARGE:                              OPERATIVE REPORT   PREOPERATIVE DIAGNOSIS: 1. Intrauterine pregnancy at 36+ weeks with spontaneous rupture of     membranes. 2. Two prior cesarean sections for repeat.  POSTOPERATIVE DIAGNOSES: 1. Intrauterine pregnancy at 36+ weeks with spontaneous rupture of     membranes. 2. Two prior cesarean sections for repeat.  OPERATIVE PROCEDURE:  Low-transverse cesarean section.  SURGEON:  Juluis Mire, M.D.  ANESTHESIA:  Spinal.  ESTIMATED BLOOD LOSS:  700 mL.  PACKS:  None.  DRAINS:  Included urethral Foley.  INTRAOPERATIVE BLOOD REPLACED:  None.  COMPLICATIONS:  None.  INDICATIONS:  Dictated history and physical.  DESCRIPTION OF PROCEDURE:  As follows, the patient taken to the OR, placed in supine position with left lateral tilt.  After satisfactory level of spinal anesthesia obtained, the abdomen was prepped with DuraPrep and eventually draped in sterile field.  A low-transverse skin incision made with knife and carried through subcutaneous tissue. Fascia was entered sharply.  Incision was fashioned laterally.  Fascia taken off the muscle superiorly and inferiorly.  Rectus muscles were separated in the midline.  Peritoneum was entered sharply.  Incision of perineum extended both superiorly and inferiorly.  The bladder was well down and beyond the pubic bone, we identified an area well above this, made a low transverse uterine incision in the uterus and extended down to the amniotic sac.  The uterus was then expanded using manual traction.  The amniotic fluid was ruptured.  Fluid was clear.  The infant in vertex presentation was delivered with fundal pressure and the use of the  vacuum extractor.  The infant was a viable female.  Apgars were 8 and 9.  Placenta was then delivered manually.  Uterus was exteriorized for closure.  Uterus was closed in a running locked suture of 0 chromic, using a 2-layer closure technique.  Areas of bleeding were brought under control with figure-of-eight to 0 chromic.  We had no active bleeding and clear urine output.  The uterus returned to abdominal cavity.  We irrigated the pelvis, had good hemostasis.  Peritoneum muscle closed with a suture of 3-0 Vicryl.  Fascia was closed with running locked suture of 0 PDS.  Deep subcu was closed with a running suture of 0 plain catgut.  Skin was closed in running subcuticular 4-0 Monocryl and Dermabond.  Sponges, instrument, and needle counts were reported as correct by circulating nurse x2.  Foley catheter remained clear at the time of closure.  The patient did tolerate the procedure well and was returned to recovery room in good condition.     Juluis Mire, M.D.     JSM/MEDQ  D:  05/18/2015  T:  05/18/2015  Job:  [id number]

## 2015-05-18 NOTE — H&P (Signed)
Krista [name] is a 34 y.o. female presenting at 37.3 with SROM.  Two prior cesarean sections for repeat.  History of DVT after prior surgery.  On aspiran during pregnancy to receive levenox post op. Maternal Medical History:  Reason for admission: Rupture of membranes.   Contractions: Onset was 1-2 hours ago.   Frequency: rare.   Perceived severity is mild.    Fetal activity: Perceived fetal activity is normal.    Prenatal complications: History of DVT  Prenatal Complications - Diabetes: none.    OB History    Gravida Para Term Preterm AB TAB SAB Ectopic Multiple Living   0 1 0 1 0 0 3     Past Medical History  Diagnosis Date  . DVT (deep venous thrombosis) (HCC)   . Asthma     exercise induced  . Obesity    Past Surgical History  Procedure Laterality Date  . Cholecystectomy    . Cesarean section    . Knee surgery     Family History: family history includes Chromosomal disorder in her son; Diabetes in her mother; Hypertension in her father and mother. Social History:  reports that she has never smoked. She does not have any smokeless tobacco history on file. She reports that she does not drink alcohol or use illicit drugs.   Prenatal Transfer Tool  Maternal Diabetes: No Genetic Screening: Normal Maternal Ultrasounds/Referrals: Normal Fetal Ultrasounds or other Referrals:  None Maternal Substance Abuse:  No Significant Maternal Medications:  None Significant Maternal Lab Results:  None Other Comments:  None  ROS    Blood pressure 132/85, pulse 83, resp. rate 18, height  (1.499 m), weight 114.76 kg (253 lb), last menstrual period 09/06/2014. Maternal Exam:  Uterine Assessment: Contraction strength is mild.  Contraction frequency is irregular.   Abdomen: Patient reports no abdominal tenderness. Surgical scars: low transverse.   Fundal height is c/'w dates.    Introitus: Amniotic fluid character: clear.     Fetal Exam Fetal State Assessment:  Category I - tracings are normal.     Physical Exam  Constitutional: She is oriented to person, place, and time. She appears well-developed and well-nourished.  Cardiovascular: Normal rate, regular rhythm and normal heart sounds.   Respiratory: Effort normal and breath sounds normal.  GI: Soft.  Neurological: She is alert and oriented to person, place, and time. She has normal reflexes.    Prenatal labs: ABO, Rh:   Antibody:   Rubella:   RPR:    HBsAg:    HIV:    GBS:     Assessment/Plan: IUP at 36.3 with SROM for repeat cesarean section History of DVT. Risk of cesarean section discussed.  These include:  Risk of infection;  Risk of hemorrhage that could require transfusions with the associated risk of aids or hepatitis;  Excessive bleeding could require hysterectomy;  Risk of injury to adjacent organs including bladder, bowel or ureters;  Risk of DVT's and possible pulmonary embolus.  Patient expresses a understanding of indications and risks.;   Walsie Smeltz S 05/18/2015, 11:47 AM

## 2015-05-18 NOTE — Anesthesia Postprocedure Evaluation (Signed)
Anesthesia Post Note  Patient: Krista [name]  Procedure(s) Performed: Procedure(s) (LRB): CESAREAN SECTION (N/A)  Anesthesia type: Spinal  Patient location: PACU  Post pain: Pain level controlled  Post assessment: Post-op Vital signs reviewed  Last Vitals:  Filed Vitals:   05/18/15 1445  BP: 120/61  Pulse: 71  Temp:   Resp: 22    Post vital signs: Reviewed  Level of consciousness: awake  Complications: No apparent anesthesia complications

## 2015-05-19 ENCOUNTER — Encounter (HOSPITAL_COMMUNITY): Payer: Self-pay | Admitting: Obstetrics and Gynecology

## 2015-05-19 LAB — CBC
HCT: 25.1 % — ABNORMAL LOW (ref 36.0–46.0)
Hemoglobin: 8.1 g/dL — ABNORMAL LOW (ref 12.0–15.0)
MCH: 28.6 pg (ref 26.0–34.0)
MCHC: 32.3 g/dL (ref 30.0–36.0)
MCV: 88.7 fL (ref 78.0–100.0)
PLATELETS: 220 10*3/uL (ref 150–400)
RBC: 2.83 MIL/uL — ABNORMAL LOW (ref 3.87–5.11)
RDW: 15.1 % (ref 11.5–15.5)
WBC: 11.7 10*3/uL — ABNORMAL HIGH (ref 4.0–10.5)

## 2015-05-19 LAB — RPR: RPR Ser Ql: NONREACTIVE

## 2015-05-19 MED ORDER — FERROUS SULFATE 325 (65 FE) MG PO TABS
325.0000 mg | ORAL_TABLET | Freq: Two times a day (BID) | ORAL | Status: DC
  Administered 2015-05-19 – 2015-05-21 (×5): 325 mg via ORAL
  Filled 2015-05-19 (×5): qty 1

## 2015-05-19 MED ORDER — LACTATED RINGERS IV SOLN
Freq: Once | INTRAVENOUS | Status: AC
  Administered 2015-05-19: 02:00:00 via INTRAVENOUS

## 2015-05-19 NOTE — Clinical Documentation Improvement (Signed)
OB/GYN   Can the noted abnormal lab findings be further specified? Please update your documentation within the medical record to reflect your response to this query. Thank you  Possible Conditions:  Iron deficiency Anemia  Acute Blood loss Anemia  Nutritional anemia, including the nutrition or mineral deficits  Chronic Anemia, including the suspected or known cause  Anemia of chronic disease, including the associated chronic disease state  Other  Clinically Undetermined  Document any associated diagnoses/conditions.  Supporting Information: Results for TARINI, CARRIER (MRN [id number]) as of 05/19/2015 15:20  05/18/2015 11:45 05/18/2015 17:40 05/19/2015 05:56  Hemoglobin 10.8 (L) 9.6 (L) 8.1 (L)  HCT 32.8 (L) 29.4 (L) 25.1 (L)  05/18/15 Op note..."OPERATIVE PROCEDURE: Low-transverse cesarean section."..."ESTIMATED BLOOD LOSS: 700 mL."... 05/19/15: LR @ 125 cc/ hr IV May discontinue or convert to saline lock if tolerating PO fluids well and T < 100.5; CBC  Please exercise your independent, professional judgment when responding. A specific answer is not anticipated or expected.  Thank You,  Toribio Harbour, RN, BSN, CCDS Certified Clinical Documentation Specialist [place]: Health Information Management [phone]

## 2015-05-19 NOTE — Lactation Note (Signed)
This note was copied from the chart of Krista [name]. Lactation Consultation Note LPI 36 2/7 weeks has no interest in BF at this time. Tongue thrusting bottle out for supplementing. Hand expressed 5 ml and gave to baby in bottle. Baby had a large stool and voided while in room.  Baby has recessed chin, encouraged to do chin tug for a deeper latch. Unable to work with baby at the breast at this time d/t dad had all ready given formula and had a hard time drinking that.  Mom has large pendulum breast, feels heavy. Pale areolas, skin colored, everted nipples short shaft, compressible areolas and nipples. Mom stated she didn't BF her others d/t latching or BF issues. One child had to be tube fed for a while.  Mom shown how to use DEBP & how to disassemble, clean, & reassemble parts. Mom knows to pump q3h for 15-20 min. Encouraged to pump for stimulation and d/t LPI. Reviewed LPI feeding expectations. Educated about LPI newborn behavior. STS encouraged. Mom encouraged to feed baby 8-12 times/24 hours and with feeding cues. Referred to Baby and Me Book in Breastfeeding section Pg. 22-23 for position options and Proper latch demonstration.[facility] brochure given w/resources, support groups and LC services. Patient Name: Krista [name] [id number] Date: 05/19/2015 Reason for consult: Initial assessment   Maternal Data Has patient been taught Hand Expression?: Yes Does the patient have breastfeeding experience prior to this delivery?: No  Feeding Feeding Type: Breast Fed Nipple Type: Slow - flow Length of feed: 10 min  LATCH Score/Interventions Latch: Too sleepy or reluctant, no latch achieved, no sucking elicited.  Intervention(s): Hand expression;Skin to skin;Alternate breast massage  Type of Nipple: Everted at rest and after stimulation  Comfort (Breast/Nipple): Soft / non-tender     Intervention(s): Breastfeeding basics reviewed;Support Pillows;Position options;Skin to skin     Lactation  Tools Discussed/Used Tools: Pump Breast pump type: Double-Electric Breast Pump Pump Review: Setup, frequency, and cleaning;Milk Storage Initiated by:: RN Date initiated:: 05/18/15   Consult Status Consult Status: Follow-up Date: 05/19/15 Follow-up type: In-patient    Hadriel Northup, Diamond Nickel 05/19/2015, 8:08 AM

## 2015-05-19 NOTE — Lactation Note (Signed)
This note was copied from the chart of Krista [name]. Lactation Consultation Note  Patient Name: Krista Kanaya Gunnarson [id number] Date: 05/19/2015 Reason for consult: Follow-up assessment  Baby 25 hours old. Parents report that baby was circumcised this morning at 1000 and has been sleepy since. Parents state that baby was supplemented 7 mls of Alimentum by bottle and tolerated well. Enc mom to offer lots of STS, and nurse with cues. Enc mom to offer breast first, at least every 3 hours since baby LPI, and then supplement with EBM/formula after. Mom states that she may call for assistance with latching at next feeding.  Maternal Data    Feeding Feeding Type: Formula  LATCH Score/Interventions                      Lactation Tools Discussed/Used     Consult Status Consult Status: Follow-up Date: 05/20/15 Follow-up type: In-patient    Geralynn Ochs 05/19/2015, 3:31 PM

## 2015-05-19 NOTE — Progress Notes (Signed)
Subjective: Postpartum Day 1: Cesarean Delivery Patient reports tolerating PO.  Requests circ.  Objective: Vital signs in last 24 hours: Temp:  [97.3 F (36.3 C)-98.8 F (37.1 C)] 98.6 F (37 C) (10/10 0745) Pulse Rate:  [55-93] 86 (10/10 0745) Resp:  [15-27] 18 (10/10 0745) BP: (108-136)/(50-87) 126/66 mmHg (10/10 0745) SpO2:  [94 %-99 %] 94 % (10/10 0745) Weight:  [253 lb (114.76 kg)] 253 lb (114.76 kg) (10/09 1110)  Physical Exam:  General: alert, cooperative and appears stated age 34: appropriate Uterine Fundus: firm Incision: no significant drainage, pressure dressing in place; clean and dry DVT Evaluation: No evidence of DVT seen on physical exam. Negative Homan's sign. No cords or calf tenderness.   Recent Labs  05/18/15 1740 05/19/15 0556  HGB 9.6* 8.1*  HCT 29.4* 25.1*    Assessment/Plan: Status post Cesarean section. Doing well postoperatively.  Continue current care. H/O pp DVT-on prophylactic LMWH Patient is counseled for circ including risk of bleeding, infection and scarring.  All questions were answered and the patient wishes to proceed.  Krista [name] 05/19/2015, 8:42 AM

## 2015-05-20 ENCOUNTER — Encounter (HOSPITAL_COMMUNITY): Payer: Self-pay | Admitting: *Deleted

## 2015-05-20 LAB — BIRTH TISSUE RECOVERY COLLECTION (PLACENTA DONATION)

## 2015-05-20 NOTE — Progress Notes (Signed)
Subjective: Postpartum Day 2: Cesarean Delivery Patient reports tolerating PO, + BM and no problems voiding.    Objective: Vital signs in last 24 hours: Temp:  [98.2 F (36.8 C)] 98.2 F (36.8 C) (10/11 0614) Pulse Rate:  [91-112] 91 (10/11 0614) Resp:  [19-22] 19 (10/11 0614) BP: (110-126)/(55-68) 126/68 mmHg (10/11 0614) SpO2:  [94 %-98 %] 98 % (10/11 1610)  Physical Exam:  General: alert and cooperative Lochia: appropriate Uterine Fundus: firm Incision: healing well DVT Evaluation: No evidence of DVT seen on physical exam. Negative Homan's sign. No cords or calf tenderness. No significant calf/ankle edema.   Recent Labs  05/18/15 1740 05/19/15 0556  HGB 9.6* 8.1*  HCT 29.4* 25.1*    Assessment/Plan: Status post Cesarean section. Postoperative course complicated by h/o DVT   Continue current care Plan for discharge in am.  Krista [name] G 05/20/2015, 7:54 AM

## 2015-05-20 NOTE — Lactation Note (Signed)
This note was copied from the chart of Krista Ashea Winiarski. Lactation Consultation Note Visit at 55 hours of age.  Baby is 5#8 oz and getting supplement of formula.  Mom reports she is not collecting milk yet.  Encouraged mom to increase feeding to 18-8mls for hours of age.  Mom has visitors and declines assist or concerns at this time.    Patient Name: Krista [name] [id number] Date: 05/20/2015     Maternal Data    Feeding Feeding Type: Formula  LATCH Score/Interventions                      Lactation Tools Discussed/Used     Consult Status      Krista [name], [name] 05/20/2015, 9:28 PM

## 2015-05-21 MED ORDER — HYDROCORTISONE 1 % EX CREA
TOPICAL_CREAM | Freq: Three times a day (TID) | CUTANEOUS | Status: DC
  Filled 2015-05-21: qty 28

## 2015-05-21 MED ORDER — OXYCODONE-ACETAMINOPHEN 5-325 MG PO TABS: 1.0000 | ORAL_TABLET | ORAL | Status: DC | PRN

## 2015-05-21 MED ORDER — FERROUS SULFATE 325 (65 FE) MG PO TABS: 325.0000 mg | ORAL_TABLET | Freq: Two times a day (BID) | ORAL | Status: DC

## 2015-05-21 MED ORDER — ENOXAPARIN SODIUM 40 MG/0.4ML ~~LOC~~ SOLN: 40.0000 mg | SUBCUTANEOUS | Status: DC

## 2015-05-21 MED ORDER — FLUOCINOLONE ACETONIDE 0.01 % EX CREA: TOPICAL_CREAM | Freq: Three times a day (TID) | CUTANEOUS | Status: DC

## 2015-05-21 NOTE — Lactation Note (Signed)
This note was copied from the chart of Krista [name]. Lactation Consultation Note  Patient Name: Krista [name] Reason for consult: Follow-up assessment;Infant weight loss;Infant < 6lbs;Late preterm infant (7% weight loss )  Baby is 6169 hours old, breast / formula 7% weight loss, Breast feeding range - 15 -20 mins and supplementing after every feeding  Up to 25 ml . ( mom knows to increase volume). Latch score 7-8. @ 58 Hours 8.9. Voids and stools QS for age. Per mom excited the baby breast fed 20 mins this am.  LC reviewed Late Preterm infant's potential feeding behaviors , and discussed nutritive vs non - nutritive feeding behaviors.  LC recommended if the baby isn't in to latching at the breast to try a portion of the supplement 1st and then to the breast.  If the baby won't latch , finish the feeding with the supplement , post pump. And try at the next feeding at the breast.  Sore nipple and engorgement prevention and tx. Reviewed.  Per mom has a DEBP at home. LC offered mom an LC O/P apt . Mom declined making the apt. Today and will plan to call back for apt . Late next week. Mother informed of post-discharge support and given phone number to the lactation department, including services for phone call assistance; out-patient appointments; and breastfeeding support group. List of other breastfeeding resources in the community given in the handout. Encouraged mother to call for problems or concerns related to breastfeeding.   Maternal Data    Feeding Feeding Type:  (last fed at 0930 form a bottle )  LATCH Score/Interventions                Intervention(s): Breastfeeding basics reviewed     Lactation Tools Discussed/Used     Consult Status Consult Status: Complete (LC O/P apt. offered to mom , mom declined today but will call when she knows her transportation plansfor later next week ) Date: 05/21/15    [name], [name] [name],  10:56 AM

## 2015-05-21 NOTE — Discharge Summary (Signed)
Obstetric Discharge Summary Reason for Admission: rupture of membranes Prenatal Procedures: ultrasound Intrapartum Procedures: cesarean: low cervical, transverse Postpartum Procedures: none Complications-Operative and Postpartum: h/o DVT HEMOGLOBIN  Date Value Ref Range Status  05/19/2015 8.1* 12.0 - 15.0 [name]/dL Final   HCT  Date Value Ref Range Status  05/19/2015 25.1* 36.0 - 46.0 % Final    Physical Exam:  General: alert and cooperative Lochia: appropriate Uterine Fundus: firm Incision: healing well, erythematous rash on abdomen superior to honeycomb dressing  DVT Evaluation: No evidence of DVT seen on physical exam. Negative Homan's sign. No cords or calf tenderness.  Discharge Diagnoses: Term Pregnancy-delivered and with h/o DVT  Discharge Information: Date: 05/21/2015 Activity: pelvic rest Diet: routine Medications: PNV, Percocet and lovenox and topical hydrocortisone Condition: stable Instructions: refer to practice specific booklet Discharge to: home   Newborn Data: Live born female  Birth Weight: 5 lb 13.5 oz (2650 [name]) APGAR: 8, 9  Home with mother.  Krista [name] 05/21/2015, 8:08 AM

## 2015-05-22 ENCOUNTER — Encounter (HOSPITAL_COMMUNITY): Payer: Self-pay | Admitting: *Deleted

## 2015-06-05 ENCOUNTER — Inpatient Hospital Stay (HOSPITAL_COMMUNITY): Admission: RE | Admit: 2015-06-05 | Payer: BLUE CROSS/BLUE SHIELD | Source: Ambulatory Visit

## 2015-12-01 DIAGNOSIS — R102 Pelvic and perineal pain: Secondary | ICD-10-CM | POA: Diagnosis not present

## 2015-12-01 DIAGNOSIS — N83202 Unspecified ovarian cyst, left side: Secondary | ICD-10-CM | POA: Diagnosis not present

## 2016-03-10 DIAGNOSIS — S63602A Unspecified sprain of left thumb, initial encounter: Secondary | ICD-10-CM | POA: Diagnosis not present

## 2016-07-30 DIAGNOSIS — S93602A Unspecified sprain of left foot, initial encounter: Secondary | ICD-10-CM | POA: Diagnosis not present

## 2016-08-27 DIAGNOSIS — S93602A Unspecified sprain of left foot, initial encounter: Secondary | ICD-10-CM | POA: Diagnosis not present

## 2016-09-01 DIAGNOSIS — M25572 Pain in left ankle and joints of left foot: Secondary | ICD-10-CM | POA: Diagnosis not present

## 2016-09-01 DIAGNOSIS — J209 Acute bronchitis, unspecified: Secondary | ICD-10-CM | POA: Diagnosis not present

## 2016-09-15 DIAGNOSIS — M25572 Pain in left ankle and joints of left foot: Secondary | ICD-10-CM | POA: Diagnosis not present

## 2016-09-24 DIAGNOSIS — Z6841 Body Mass Index (BMI) 40.0 and over, adult: Secondary | ICD-10-CM | POA: Diagnosis not present

## 2016-09-24 DIAGNOSIS — Z01419 Encounter for gynecological examination (general) (routine) without abnormal findings: Secondary | ICD-10-CM | POA: Diagnosis not present

## 2017-07-07 DIAGNOSIS — Z23 Encounter for immunization: Secondary | ICD-10-CM | POA: Diagnosis not present

## 2017-09-14 DIAGNOSIS — H9193 Unspecified hearing loss, bilateral: Secondary | ICD-10-CM | POA: Diagnosis not present

## 2017-10-05 DIAGNOSIS — Z01419 Encounter for gynecological examination (general) (routine) without abnormal findings: Secondary | ICD-10-CM | POA: Diagnosis not present

## 2017-10-05 DIAGNOSIS — Z6841 Body Mass Index (BMI) 40.0 and over, adult: Secondary | ICD-10-CM | POA: Diagnosis not present

## 2017-11-02 DIAGNOSIS — S322XXA Fracture of coccyx, initial encounter for closed fracture: Secondary | ICD-10-CM | POA: Diagnosis not present

## 2017-11-02 DIAGNOSIS — S40012A Contusion of left shoulder, initial encounter: Secondary | ICD-10-CM | POA: Diagnosis not present

## 2017-12-20 DIAGNOSIS — M25561 Pain in right knee: Secondary | ICD-10-CM | POA: Diagnosis not present

## 2018-05-16 DIAGNOSIS — N911 Secondary amenorrhea: Secondary | ICD-10-CM | POA: Diagnosis not present

## 2018-05-25 DIAGNOSIS — Z13228 Encounter for screening for other metabolic disorders: Secondary | ICD-10-CM | POA: Diagnosis not present

## 2018-05-25 DIAGNOSIS — Z3685 Encounter for antenatal screening for Streptococcus B: Secondary | ICD-10-CM | POA: Diagnosis not present

## 2018-05-25 DIAGNOSIS — Z23 Encounter for immunization: Secondary | ICD-10-CM | POA: Diagnosis not present

## 2018-05-25 DIAGNOSIS — Z113 Encounter for screening for infections with a predominantly sexual mode of transmission: Secondary | ICD-10-CM | POA: Diagnosis not present

## 2018-05-25 DIAGNOSIS — Z3482 Encounter for supervision of other normal pregnancy, second trimester: Secondary | ICD-10-CM | POA: Diagnosis not present

## 2018-05-25 DIAGNOSIS — Z331 Pregnant state, incidental: Secondary | ICD-10-CM | POA: Diagnosis not present

## 2018-05-25 DIAGNOSIS — Z3481 Encounter for supervision of other normal pregnancy, first trimester: Secondary | ICD-10-CM | POA: Diagnosis not present

## 2018-05-25 DIAGNOSIS — Z124 Encounter for screening for malignant neoplasm of cervix: Secondary | ICD-10-CM | POA: Diagnosis not present

## 2018-05-25 DIAGNOSIS — Z3491 Encounter for supervision of normal pregnancy, unspecified, first trimester: Secondary | ICD-10-CM | POA: Diagnosis not present

## 2018-06-06 DIAGNOSIS — Z113 Encounter for screening for infections with a predominantly sexual mode of transmission: Secondary | ICD-10-CM | POA: Diagnosis not present

## 2018-06-06 DIAGNOSIS — Z124 Encounter for screening for malignant neoplasm of cervix: Secondary | ICD-10-CM | POA: Diagnosis not present

## 2018-06-06 DIAGNOSIS — Z331 Pregnant state, incidental: Secondary | ICD-10-CM | POA: Diagnosis not present

## 2018-06-06 DIAGNOSIS — Z3491 Encounter for supervision of normal pregnancy, unspecified, first trimester: Secondary | ICD-10-CM | POA: Diagnosis not present

## 2018-06-20 DIAGNOSIS — O09521 Supervision of elderly multigravida, first trimester: Secondary | ICD-10-CM | POA: Diagnosis not present

## 2018-06-20 DIAGNOSIS — Z3682 Encounter for antenatal screening for nuchal translucency: Secondary | ICD-10-CM | POA: Diagnosis not present

## 2018-08-01 DIAGNOSIS — Z363 Encounter for antenatal screening for malformations: Secondary | ICD-10-CM | POA: Diagnosis not present

## 2018-08-01 DIAGNOSIS — Z3A18 18 weeks gestation of pregnancy: Secondary | ICD-10-CM | POA: Diagnosis not present

## 2018-08-14 ENCOUNTER — Other Ambulatory Visit: Payer: Self-pay | Admitting: *Deleted

## 2018-08-14 ENCOUNTER — Ambulatory Visit
Admission: RE | Admit: 2018-08-14 | Discharge: 2018-08-14 | Disposition: A | Discharge status: Home / Self Care | Payer: BLUE CROSS/BLUE SHIELD | Source: Ambulatory Visit | Attending: *Deleted | Admitting: *Deleted

## 2018-08-14 DIAGNOSIS — O99112 Other diseases of the blood and blood-forming organs and certain disorders involving the immune mechanism complicating pregnancy, second trimester: Principal | ICD-10-CM

## 2018-08-14 DIAGNOSIS — Z3A20 20 weeks gestation of pregnancy: Secondary | ICD-10-CM

## 2018-08-14 DIAGNOSIS — M79662 Pain in left lower leg: Secondary | ICD-10-CM | POA: Diagnosis not present

## 2018-08-14 DIAGNOSIS — D689 Coagulation defect, unspecified: Secondary | ICD-10-CM

## 2019-06-29 DIAGNOSIS — K580 Irritable bowel syndrome with diarrhea: Secondary | ICD-10-CM | POA: Diagnosis not present

## 2019-06-29 DIAGNOSIS — F419 Anxiety disorder, unspecified: Secondary | ICD-10-CM | POA: Diagnosis not present

## 2019-09-06 DIAGNOSIS — F419 Anxiety disorder, unspecified: Secondary | ICD-10-CM | POA: Diagnosis not present

## 2019-11-06 ENCOUNTER — Other Ambulatory Visit: Payer: Self-pay | Admitting: Obstetrics and Gynecology

## 2019-11-06 DIAGNOSIS — Z1231 Encounter for screening mammogram for malignant neoplasm of breast: Secondary | ICD-10-CM

## 2019-11-12 ENCOUNTER — Inpatient Hospital Stay: Admission: RE | Admit: 2019-11-12 | Payer: BC Managed Care – PPO | Source: Ambulatory Visit

## 2019-11-12 ENCOUNTER — Other Ambulatory Visit: Payer: Self-pay | Admitting: Family Medicine

## 2019-11-12 ENCOUNTER — Other Ambulatory Visit: Payer: Self-pay

## 2019-11-12 ENCOUNTER — Ambulatory Visit
Admission: RE | Admit: 2019-11-12 | Discharge: 2019-11-12 | Disposition: A | Discharge status: Home / Self Care | Payer: BC Managed Care – PPO | Source: Ambulatory Visit | Attending: Family Medicine | Admitting: Family Medicine

## 2019-11-12 ENCOUNTER — Ambulatory Visit: Payer: BC Managed Care – PPO

## 2019-11-12 DIAGNOSIS — Z1231 Encounter for screening mammogram for malignant neoplasm of breast: Secondary | ICD-10-CM

## 2019-12-17 ENCOUNTER — Encounter (HOSPITAL_BASED_OUTPATIENT_CLINIC_OR_DEPARTMENT_OTHER): Payer: Self-pay

## 2019-12-17 ENCOUNTER — Other Ambulatory Visit: Payer: Self-pay

## 2019-12-17 ENCOUNTER — Emergency Department (HOSPITAL_BASED_OUTPATIENT_CLINIC_OR_DEPARTMENT_OTHER)
Admission: EM | Admit: 2019-12-17 | Discharge: 2019-12-17 | Disposition: A | Discharge status: Home / Self Care | Payer: BC Managed Care – PPO | Attending: Emergency Medicine | Admitting: Emergency Medicine

## 2019-12-17 DIAGNOSIS — Z9049 Acquired absence of other specified parts of digestive tract: Secondary | ICD-10-CM | POA: Diagnosis not present

## 2019-12-17 DIAGNOSIS — S060X0A Concussion without loss of consciousness, initial encounter: Secondary | ICD-10-CM

## 2019-12-17 DIAGNOSIS — J45909 Unspecified asthma, uncomplicated: Secondary | ICD-10-CM | POA: Diagnosis not present

## 2019-12-17 DIAGNOSIS — W2103XA Struck by baseball, initial encounter: Secondary | ICD-10-CM | POA: Diagnosis not present

## 2019-12-17 DIAGNOSIS — Y929 Unspecified place or not applicable: Secondary | ICD-10-CM | POA: Diagnosis not present

## 2019-12-17 DIAGNOSIS — Y939 Activity, unspecified: Secondary | ICD-10-CM | POA: Diagnosis not present

## 2019-12-17 DIAGNOSIS — Z79899 Other long term (current) drug therapy: Secondary | ICD-10-CM | POA: Insufficient documentation

## 2019-12-17 DIAGNOSIS — S0990XA Unspecified injury of head, initial encounter: Secondary | ICD-10-CM

## 2019-12-17 DIAGNOSIS — Y999 Unspecified external cause status: Secondary | ICD-10-CM | POA: Insufficient documentation

## 2019-12-17 MED ORDER — METHOCARBAMOL 500 MG PO TABS: 500.0000 mg | ORAL_TABLET | Freq: Every evening | ORAL | 0 refills | Status: DC | PRN

## 2019-12-17 NOTE — ED Notes (Signed)
ED Provider at bedside. 
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
## 2019-12-17 NOTE — Discharge Instructions (Addendum)
Use Tylenol or ibuprofen as needed for headache. You may try the muscle relaxer to help with stiffness and soreness. Try to get as much brain rest as possible, decreasing stimulus including lights, noises, and activity.  Follow-up with your primary care doctor in 1 week if your symptoms not improving. Return to the emergency room if you develop vision changes, numbness, vomiting, persistent dizziness, or any new, worsening, or concerning symptoms.

## 2020-02-28 DIAGNOSIS — Z87448 Personal history of other diseases of urinary system: Secondary | ICD-10-CM | POA: Diagnosis not present

## 2020-02-28 DIAGNOSIS — N951 Menopausal and female climacteric states: Secondary | ICD-10-CM | POA: Diagnosis not present

## 2020-02-28 DIAGNOSIS — Z79890 Hormone replacement therapy (postmenopausal): Secondary | ICD-10-CM | POA: Diagnosis not present

## 2020-02-28 DIAGNOSIS — N821 Other female urinary-genital tract fistulae: Secondary | ICD-10-CM | POA: Diagnosis not present

## 2020-02-28 DIAGNOSIS — N3941 Urge incontinence: Secondary | ICD-10-CM | POA: Diagnosis not present

## 2020-03-06 DIAGNOSIS — R0981 Nasal congestion: Secondary | ICD-10-CM | POA: Diagnosis not present

## 2020-03-06 DIAGNOSIS — R05 Cough: Secondary | ICD-10-CM | POA: Diagnosis not present

## 2020-03-06 DIAGNOSIS — J0110 Acute frontal sinusitis, unspecified: Secondary | ICD-10-CM | POA: Diagnosis not present

## 2020-03-06 DIAGNOSIS — F419 Anxiety disorder, unspecified: Secondary | ICD-10-CM | POA: Diagnosis not present

## 2020-03-06 DIAGNOSIS — Z03818 Encounter for observation for suspected exposure to other biological agents ruled out: Secondary | ICD-10-CM | POA: Diagnosis not present

## 2020-10-13 ENCOUNTER — Other Ambulatory Visit: Payer: Self-pay | Admitting: Family Medicine

## 2020-10-13 DIAGNOSIS — Z1231 Encounter for screening mammogram for malignant neoplasm of breast: Secondary | ICD-10-CM

## 2020-11-19 DIAGNOSIS — F419 Anxiety disorder, unspecified: Secondary | ICD-10-CM | POA: Insufficient documentation

## 2020-11-24 ENCOUNTER — Inpatient Hospital Stay: Admission: RE | Admit: 2020-11-24 | Payer: BC Managed Care – PPO | Source: Ambulatory Visit

## 2020-11-24 DIAGNOSIS — Z1231 Encounter for screening mammogram for malignant neoplasm of breast: Secondary | ICD-10-CM

## 2021-01-21 ENCOUNTER — Ambulatory Visit: Payer: Medicaid Other

## 2021-03-03 ENCOUNTER — Other Ambulatory Visit: Payer: Self-pay

## 2021-03-03 ENCOUNTER — Ambulatory Visit
Admission: RE | Admit: 2021-03-03 | Discharge: 2021-03-03 | Disposition: A | Discharge status: Home / Self Care | Payer: Medicaid Other | Source: Ambulatory Visit | Attending: Family Medicine | Admitting: Family Medicine

## 2021-03-03 DIAGNOSIS — Z1231 Encounter for screening mammogram for malignant neoplasm of breast: Secondary | ICD-10-CM

## 2021-03-17 ENCOUNTER — Ambulatory Visit: Payer: Medicaid Other

## 2021-05-11 ENCOUNTER — Emergency Department (HOSPITAL_BASED_OUTPATIENT_CLINIC_OR_DEPARTMENT_OTHER)
Admission: EM | Admit: 2021-05-11 | Discharge: 2021-05-11 | Disposition: A | Discharge status: Home / Self Care | Payer: Medicaid Other | Attending: Emergency Medicine | Admitting: Emergency Medicine

## 2021-05-11 ENCOUNTER — Emergency Department (HOSPITAL_BASED_OUTPATIENT_CLINIC_OR_DEPARTMENT_OTHER): Payer: Medicaid Other | Admitting: Radiology

## 2021-05-11 ENCOUNTER — Encounter (HOSPITAL_BASED_OUTPATIENT_CLINIC_OR_DEPARTMENT_OTHER): Payer: Self-pay

## 2021-05-11 ENCOUNTER — Other Ambulatory Visit: Payer: Self-pay

## 2021-05-11 DIAGNOSIS — M546 Pain in thoracic spine: Secondary | ICD-10-CM

## 2021-05-11 DIAGNOSIS — Y92410 Unspecified street and highway as the place of occurrence of the external cause: Secondary | ICD-10-CM | POA: Diagnosis not present

## 2021-05-11 DIAGNOSIS — J45909 Unspecified asthma, uncomplicated: Secondary | ICD-10-CM | POA: Diagnosis not present

## 2021-05-11 DIAGNOSIS — M25512 Pain in left shoulder: Secondary | ICD-10-CM

## 2021-05-11 MED ORDER — DICLOFENAC SODIUM 50 MG PO TBEC: 50.0000 mg | DELAYED_RELEASE_TABLET | Freq: Two times a day (BID) | ORAL | 0 refills | Status: AC

## 2021-05-11 MED ORDER — OXYCODONE-ACETAMINOPHEN 5-325 MG PO TABS
1.0000 | ORAL_TABLET | Freq: Once | ORAL | Status: AC
  Administered 2021-05-11: 1 via ORAL
  Filled 2021-05-11: qty 1

## 2021-05-11 MED ORDER — METHOCARBAMOL 500 MG PO TABS: 500.0000 mg | ORAL_TABLET | Freq: Two times a day (BID) | ORAL | 0 refills | Status: DC

## 2021-05-11 MED ORDER — LIDOCAINE 5 % EX PTCH: 1.0000 | MEDICATED_PATCH | CUTANEOUS | 0 refills | Status: DC
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
## 2021-05-11 NOTE — Discharge Instructions (Signed)
Warm compresses for 20 days at a time followed by gentle stretching.  Apply Lidoderm patch as needed as prescribed.  Take Robaxin and diclofenac as needed prescribed.  Do not drive or operate machinery until you know how Robaxin will affect you. Follow-up with your primary care provider.  You may benefit from physical therapy if not improving after 5 days.

## 2021-05-11 NOTE — ED Notes (Signed)
ED Provider at bedside. 

## 2021-05-12 ENCOUNTER — Ambulatory Visit: Payer: Self-pay

## 2021-05-12 ENCOUNTER — Ambulatory Visit (INDEPENDENT_AMBULATORY_CARE_PROVIDER_SITE_OTHER): Payer: Medicaid Other | Admitting: Family Medicine

## 2021-05-12 ENCOUNTER — Encounter: Payer: Self-pay | Admitting: Family Medicine

## 2021-05-12 VITALS — BP 130/88 | HR 83 | Ht 59.0 in | Wt 220.6 lb

## 2021-05-12 DIAGNOSIS — M25512 Pain in left shoulder: Secondary | ICD-10-CM | POA: Diagnosis not present

## 2021-05-12 DIAGNOSIS — M545 Low back pain, unspecified: Secondary | ICD-10-CM | POA: Diagnosis not present

## 2021-05-12 MED ORDER — NAPROXEN 500 MG PO TABS: 500.0000 mg | ORAL_TABLET | Freq: Two times a day (BID) | ORAL | 3 refills | Status: DC
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
## 2021-05-12 NOTE — Patient Instructions (Addendum)
Thank you for coming in today.   Use heat on your low back and shoulder.  I've referred you to Physical Therapy.  Let us know if you don't hear from them in one week.   TENS UNIT: This is helpful for muscle pain and spasm.   Search and Purchase a TENS 7000 2nd edition at  www.tenspros.com or www.Amazon.com It should be less than $30.     TENS unit instructions: Do not shower or bathe with the unit on Turn the unit off before removing electrodes or batteries If the electrodes lose stickiness add a drop of water to the electrodes after they are disconnected from the unit and place on plastic sheet. If you continued to have difficulty, call the TENS unit company to purchase more electrodes. Do not apply lotion on the skin area prior to use. Make sure the skin is clean and dry as this will help prolong the life of the electrodes. After use, always check skin for unusual red areas, rash or other skin difficulties. If there are any skin problems, does not apply electrodes to the same area. Never remove the electrodes from the unit by pulling the wires. Do not use the TENS unit or electrodes other than as directed. Do not change electrode placement without consultating your therapist or physician. Keep 2 fingers with between each electrode. Wear time ratio is 2:1, on to off times.    For example on for 30 minutes off for 15 minutes and then on for 30 minutes off for 15 minutes  Recheck in 6 weeks.

## 2021-06-03 ENCOUNTER — Ambulatory Visit: Payer: Medicaid Other | Admitting: Physical Therapy

## 2021-06-10 ENCOUNTER — Encounter: Payer: Self-pay | Admitting: Physical Therapy

## 2021-06-10 ENCOUNTER — Ambulatory Visit: Payer: Medicaid Other | Attending: Family Medicine | Admitting: Physical Therapy

## 2021-06-10 ENCOUNTER — Other Ambulatory Visit: Payer: Self-pay

## 2021-06-10 DIAGNOSIS — M25512 Pain in left shoulder: Secondary | ICD-10-CM | POA: Insufficient documentation

## 2021-06-10 DIAGNOSIS — R293 Abnormal posture: Secondary | ICD-10-CM | POA: Insufficient documentation

## 2021-06-10 DIAGNOSIS — M6281 Muscle weakness (generalized): Secondary | ICD-10-CM | POA: Insufficient documentation

## 2021-06-10 DIAGNOSIS — M546 Pain in thoracic spine: Secondary | ICD-10-CM | POA: Diagnosis present
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
## 2021-06-15 HISTORY — PX: GASTRIC BYPASS: SHX52
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
## 2021-06-23 ENCOUNTER — Ambulatory Visit: Payer: Medicaid Other | Admitting: Physical Therapy

## 2021-06-23 ENCOUNTER — Ambulatory Visit: Payer: Medicaid Other | Admitting: Family Medicine

## 2021-06-25 ENCOUNTER — Ambulatory Visit: Payer: Medicaid Other | Admitting: Physical Therapy

## 2021-07-01 ENCOUNTER — Ambulatory Visit: Payer: Medicaid Other | Admitting: Physical Therapy

## 2021-07-01 ENCOUNTER — Other Ambulatory Visit: Payer: Self-pay

## 2021-07-01 ENCOUNTER — Encounter: Payer: Self-pay | Admitting: Physical Therapy

## 2021-07-01 DIAGNOSIS — M6281 Muscle weakness (generalized): Secondary | ICD-10-CM

## 2021-07-01 DIAGNOSIS — R293 Abnormal posture: Secondary | ICD-10-CM

## 2021-07-01 DIAGNOSIS — M25512 Pain in left shoulder: Secondary | ICD-10-CM

## 2021-07-01 DIAGNOSIS — M546 Pain in thoracic spine: Secondary | ICD-10-CM

## 2021-07-01 NOTE — Therapy (Signed)
[facility] [address] [place], [place], [address] Phone: [phone]   Fax:  [phone]  Physical Therapy Treatment  Patient Details  Name: Krista [name] MRN: [id number] Date of Birth: Nov 09, 1980 Referring Provider (PT): Krista [name]   Encounter Date: 07/01/2021   PT End of Session - 07/01/21 1400     Visit Number 2    Number of Visits 13    Date for PT Re-Evaluation 07/22/21    Authorization Type UHC MCD    Authorization Time Period 06/10/21 to 07/22/21    PT Start Time 1317    PT Stop Time 1356    PT Time Calculation (min) 39 min    Activity Tolerance Patient tolerated treatment well    Behavior During Therapy [facility] for tasks assessed/performed             Past Medical History:  Diagnosis Date   Asthma    exercise induced   DVT (deep venous thrombosis) (HCC)    Obesity     Past Surgical History:  Procedure Laterality Date   CESAREAN SECTION     CESAREAN SECTION N/A 05/18/2015   Procedure: CESAREAN SECTION;  Surgeon: Krista Hedge, MD;  Location: [facility];  Service: Obstetrics;  Laterality: N/A;  Repeat edc 06/13/15 allergies to codeine, prednisone, DHA supplements   CHOLECYSTECTOMY     KNEE SURGERY      There were no vitals filed for this visit.   Subjective Assessment - 07/01/21 1322     Subjective I had that gastric surgery, can't lift more than 10# for 4 more weeks. HEP has been going so-so, I've not been able to really do them as much as I would like due to surgery    Currently in Pain? Yes    Pain Score 2     Pain Location Back    Pain Orientation Medial    Pain Descriptors / Indicators Aching;Tightness    Pain Type Chronic pain                [facility] PT Assessment - 07/01/21 0001       Observation/Other Assessments   Focus on Therapeutic Outcomes (FOTO)  52                           [facility] Adult PT Treatment/Exercise - 07/01/21 0001       Shoulder Exercises: Supine    Flexion AAROM;10 reps;Left   5 second holds   ABduction AAROM;Left;10 reps   5 second holds   Other Supine Exercises thoracic extension over half foam roll x2 minutes, then self mobs for thoracic extension 1x20 with UE flexion      Shoulder Exercises: Seated   Other Seated Exercises thoracic and cervical 3D excursions 1x10      Manual Therapy   Manual Therapy Soft tissue mobilization;Myofascial release    Soft tissue mobilization L UT and levator, upper rhomboids    Myofascial Release L UT             Plus L shoulder PROM stretches into flexion and ABD through ROM as tolerated         PT Education - 07/01/21 1400     Education Details increase consistency with HEP, heat pack for no more than 10-15 min prior to stretches, heat pack safety    Person(s) Educated Patient    Methods Explanation    Comprehension Verbalized understanding  PT Short Term Goals - 06/10/21 1750       PT SHORT TERM GOAL #1   Title Will be independent in progressive HEP to be updated PRN    Time 3    Period Weeks    Status New    Target Date 07/01/21      PT SHORT TERM GOAL #2   Title Will experience no more than 4/10 pain at worst with functional task performance and work related activities    Time 3    Period Weeks    Status New      PT SHORT TERM GOAL #3   Title L shoulder ROM to be equal to that of R shoulder in order to assist in reducing pain in this quarter    Time 3    Period Weeks    Status New      PT SHORT TERM GOAL #4   Title Cervical and thoracic ROM to be symmetrical and with no more than mild limitation bilaterally to show improved mobility and to assist in reducing pain    Time 3    Period Weeks    Status New               PT Long Term Goals - 06/10/21 1752       PT LONG TERM GOAL #1   Title MMT to have improved by at least 1 grade in all tested groups    Time 6    Period Weeks    Status New    Target Date 07/22/21      PT LONG TERM  GOAL #2   Title Will be able to reach overhead to get a 5-10# item from a shelf with BUEs without increase in pain    Time 6    Period Weeks    Status New      PT LONG TERM GOAL #3   Title Will be able to perform functional lifting tasks and functional activities related to home/child care without increase in pain    Time 6    Period Weeks    Status New      PT LONG TERM GOAL #4   Title Will be able to tolerate sitting to perform work based tasks for at least 60 minutes without an increase in pain or stiffness    Time 6    Period Weeks    Status New                   Plan - 07/01/21 1401     Clinical Impression Statement [name] arrives today feeling well- had gastric surgery recently, mostly healed but cannot lift over 10# for 4 more weeks. Focused session today on general mobility of shoulder and back with good tolerance noted. Encouraged increased compliance with HEP now that she is finished with surgical  procedure, will plan to update once compliance/consistency improves. Actually able to get full AAROM L Shoulder today without increased pain. Responded well to [facility] today. Will continue to progress as tolerated moving forward.    Personal Factors and Comorbidities Fitness;Comorbidity 1;Education;Time since onset of injury/illness/exacerbation    Comorbidities obesity    Examination-Activity Limitations Locomotion Level;Transfers;Bed Mobility;Reach Overhead;Caring for Others;Sit;Carry;Squat;Stand;Lift    Examination-Participation Restrictions Church;Meal Prep;Cleaning;Occupation;Community Activity;Driving;Shop;Yard Work;Laundry    Stability/Clinical Decision Making Stable/Uncomplicated    Clinical Decision Making Low    Rehab Potential Excellent    PT Frequency 2x / week    PT Duration 6 weeks  PT Treatment/Interventions Cryotherapy;Moist Heat;Electrical Stimulation;Iontophoresis 4mg /ml Dexamethasone;Ultrasound;Therapeutic exercise;Neuromuscular re-education;Manual  techniques;Joint Manipulations;Spinal Manipulations;Therapeutic activities;ADLs/Self Care Home Management;Patient/family education    PT Next Visit Plan continue to work on general mobility and stretching in cervical/thoracic/lumbar spines. L shoulder ROM, try to progress to AROM. STM and postural training    PT Home Exercise Plan    Consulted and Agree with Plan of Care Patient             Patient will benefit from skilled therapeutic intervention in order to improve the following deficits and impairments:  Decreased range of motion, Increased fascial restricitons, Increased muscle spasms, Impaired UE functional use, Obesity, Pain, Impaired flexibility, Improper body mechanics, Decreased strength, Postural dysfunction  Visit Diagnosis: Acute pain of left shoulder  Pain in thoracic spine  Abnormal posture  Muscle weakness (generalized)     Problem List Patient Active Problem List   Diagnosis Date Noted   Status post repeat low transverse cesarean section 05/18/2015    Class: Status post   S/P cesarean section 05/18/2015   Advanced maternal age in multigravida 01/07/2015   Previous pregnancy complicated by chromosomal abnormality, antepartum    AMA (advanced maternal age) multigravida 35+    Encounter for fetal anatomic survey    [redacted] weeks gestation of pregnancy    Maternal morbid obesity, antepartum (HCC)    Personal history of DVT (deep vein thrombosis)    Encounter for preconception consultation    Pain in limb 11/28/2013   Acute DVT (deep venous thrombosis) (HCC) 07/11/2013   14/10/2012 PT, DPT, PN2   Supplemental Physical Therapist [facility]    Pager [phone] Acute Rehab Office [phone]   [facility] [place] [facility] [address] [place], [place], [place] Phone: [phone]   Fax:  [phone]  Name: Krista [name] MRN: Mauri Brooklyn Date of Birth: Aug 08, 1981

## 2021-07-07 ENCOUNTER — Ambulatory Visit: Payer: Medicaid Other | Admitting: Physical Therapy

## 2021-07-09 ENCOUNTER — Other Ambulatory Visit: Payer: Self-pay

## 2021-07-09 ENCOUNTER — Ambulatory Visit: Payer: Medicaid Other | Admitting: Physical Therapy

## 2021-07-09 ENCOUNTER — Ambulatory Visit: Payer: Medicaid Other | Attending: Family Medicine | Admitting: Physical Therapy

## 2021-07-09 DIAGNOSIS — R293 Abnormal posture: Secondary | ICD-10-CM | POA: Insufficient documentation

## 2021-07-09 DIAGNOSIS — M546 Pain in thoracic spine: Secondary | ICD-10-CM | POA: Insufficient documentation

## 2021-07-09 DIAGNOSIS — M25512 Pain in left shoulder: Secondary | ICD-10-CM | POA: Diagnosis not present

## 2021-07-09 DIAGNOSIS — M6281 Muscle weakness (generalized): Secondary | ICD-10-CM

## 2021-07-09 NOTE — Patient Instructions (Signed)

## 2021-07-09 NOTE — Therapy (Signed)
[facility]. [place], [place], [address] Phone: [phone]   Fax:  [phone]  Physical Therapy Treatment  Patient Details  Name: Krista [name] MRN: [id number] Date of Birth: [birth year] Referring Provider (PT): Lynne Leader   Encounter Date: 07/09/2021   PT End of Session - 07/09/21 1730     Visit Number 3    Date for PT Re-Evaluation 07/22/21    Authorization Type U[employer] MCD    Authorization Time Period 06/10/21 to 07/22/21    PT Start Time 1645    PT Stop Time 8676    PT Time Calculation (min) 60 min             Past Medical History:  Diagnosis Date   Asthma    exercise induced   DVT (deep venous thrombosis) ([place])    Obesity     Past Surgical History:  Procedure Laterality Date   CESAREAN SECTION     CESAREAN SECTION N/A 05/18/2015   Procedure: CESAREAN SECTION;  Surgeon: Everlene Farrier, MD;  Location: [facility];  Service: Obstetrics;  Laterality: N/A;  Repeat edc 06/13/15 allergies to codeine, prednisone, DHA supplements   CHOLECYSTECTOMY     KNEE SURGERY      There were no vitals filed for this visit.   Subjective Assessment - 07/09/21 1651     Subjective shld pain with reaching an dlifting- none at rest, back hurts with sitting    Currently in Pain? Yes                               [facility] Adult PT Treatment/Exercise - 07/09/21 0001       Shoulder Exercises: Supine   Other Supine Exercises left shld flex,IR/ER and diagnols 10 x 3 #      Shoulder Exercises: Standing   Other Standing Exercises finger ladder 2# 5 x flex and abd with eccentric lowering    Other Standing Exercises cane ex 3# wt bar 10 each flex,abd,chest press, ext and IR      Shoulder Exercises: ROM/Strengthening   UBE (Upper Arm Bike) L 2 2 min fwd/2 min back      Manual Therapy   Manual Therapy Soft tissue mobilization;Passive ROM   Simultaneous filing. User may not have seen previous data.   Soft  tissue mobilization L UT and levator, upper rhomboids   Simultaneous filing. User may not have seen previous data.   Myofascial Release L UT    Passive ROM left UE              Trigger Point Dry Needling - 07/09/21 0001     Consent Given? Yes    Education Handout Provided Yes    Muscles Treated Head and Neck Upper trapezius    Upper Trapezius Response Twitch reponse elicited;Palpable increased muscle length                     PT Short Term Goals - 07/09/21 1731       PT SHORT TERM GOAL #1   Title Will be independent in progressive HEP to be updated PRN    Status Achieved               PT Long Term Goals - 06/10/21 1752       PT LONG TERM GOAL #1   Title MMT to have improved by at least 1 grade in all  tested groups    Time 6    Period Weeks    Status New    Target Date 07/22/21      PT LONG TERM GOAL #2   Title Will be able to reach overhead to get a 5-10# item from a shelf with BUEs without increase in pain    Time 6    Period Weeks    Status New      PT LONG TERM GOAL #3   Title Will be able to perform functional lifting tasks and functional activities related to home/child care without increase in pain    Time 6    Period Weeks    Status New      PT LONG TERM GOAL #4   Title Will be able to tolerate sitting to perform work based tasks for at least 60 minutes without an increase in pain or stiffness    Time 6    Period Weeks    Status New                   Plan - 07/09/21 1731     Clinical Impression Statement STG met. Full PROM and AA. Pain with some light resistance but tolerable and stopped when mvmt stopped. STW,DN and estim [place] to help with TP in Left UT    PT Treatment/Interventions Cryotherapy;Moist Heat;Electrical Stimulation;Iontophoresis 13m/ml Dexamethasone;Ultrasound;Therapeutic exercise;Neuromuscular re-education;Manual techniques;Joint Manipulations;Spinal Manipulations;Therapeutic activities;ADLs/Self Care Home  Management;Patient/family education    PT Next Visit Plan assess today session and progress             Patient will benefit from skilled therapeutic intervention in order to improve the following deficits and impairments:  Decreased range of motion, Increased fascial restricitons, Increased muscle spasms, Impaired UE functional use, Obesity, Pain, Impaired flexibility, Improper body mechanics, Decreased strength, Postural dysfunction  Visit Diagnosis: Acute pain of left shoulder  Muscle weakness (generalized)     Problem List Patient Active Problem List   Diagnosis Date Noted   Status post repeat low transverse cesarean section 05/18/2015    Class: Status post   S/P cesarean section 05/18/2015   Advanced maternal age in multigravida 01/07/2015   Previous pregnancy complicated by chromosomal abnormality, antepartum    AMA (advanced maternal age) multigravida 35+    Encounter for fetal anatomic survey    [redacted] weeks gestation of pregnancy    Maternal morbid obesity, antepartum ([place]    Personal history of DVT (deep vein thrombosis)    Encounter for preconception consultation    Pain in limb 11/28/2013   Acute D[place] (deep venous thrombosis) ([place] 07/11/2013    Wiliam Cauthorn,ANGIE, PTA 07/09/2021, 5:33 PM  [place] [address]Phone: [phone]  Fax:  [phone] Name: Krista SCAFFMRN: [id number] of Birth: 2January 13, 1982

## 2021-07-15 ENCOUNTER — Ambulatory Visit: Payer: Medicaid Other | Admitting: Physical Therapy

## 2021-07-16 ENCOUNTER — Ambulatory Visit: Payer: Medicaid Other | Admitting: Physical Therapy

## 2021-07-16 ENCOUNTER — Encounter: Payer: Self-pay | Admitting: Physical Therapy

## 2021-07-16 ENCOUNTER — Other Ambulatory Visit: Payer: Self-pay

## 2021-07-16 DIAGNOSIS — M25512 Pain in left shoulder: Secondary | ICD-10-CM

## 2021-07-16 DIAGNOSIS — R293 Abnormal posture: Secondary | ICD-10-CM

## 2021-07-16 DIAGNOSIS — M6281 Muscle weakness (generalized): Secondary | ICD-10-CM

## 2021-07-16 DIAGNOSIS — M546 Pain in thoracic spine: Secondary | ICD-10-CM

## 2021-07-16 NOTE — Therapy (Addendum)
[facility] [address]  [place], [place], [address] Phone: [phone]   Fax:  [phone]  Physical Therapy Treatment / Discharge Summary  Patient Details  Name: Krista [name] MRN: [id number] Date of Birth: 1981/03/03 Referring Provider (PT): Lynne Leader   Encounter Date: 07/16/2021   PT End of Session - 07/16/21 1436     Visit Number 4    Number of Visits 13    Date for PT Re-Evaluation 07/22/21    Authorization Type UHC MCD    Authorization Time Period 06/10/21 to 07/22/21    PT Start Time 1319    PT Stop Time 1400    PT Time Calculation (min) 41 min    Activity Tolerance Patient tolerated treatment well    Behavior During Therapy [facility] for tasks assessed/performed             Past Medical History:  Diagnosis Date   Asthma    exercise induced   DVT (deep venous thrombosis) ([place])    Obesity     Past Surgical History:  Procedure Laterality Date   CESAREAN SECTION     CESAREAN SECTION N/A 05/18/2015   Procedure: CESAREAN SECTION;  Surgeon: Everlene Farrier, MD;  Location: [facility];  Service: Obstetrics;  Laterality: N/A;  Repeat edc 06/13/15 allergies to codeine, prednisone, DHA supplements   CHOLECYSTECTOMY     KNEE SURGERY      There were no vitals filed for this visit.   Subjective Assessment - 07/16/21 1436     Subjective Pt reports that she no longer has pain in Lt shoulder/neck at rest, but has the most pain with IR/ER.  Her lift restrictions after gastric surgery have recently been lifted.  She reports that the DN last visit really help; was sore for 3 days afterwards.    Currently in Pain? No/denies    Pain Score 0-No pain   up to 3/10 with motion.   Pain Location Shoulder    Pain Orientation Left    Pain Descriptors / Indicators Sore;Tightness    Aggravating Factors  see above.    Pain Relieving Factors heating pad                [facility] PT Assessment - 07/16/21 0001        Assessment   Medical Diagnosis back and shoulder pain    Referring Provider (PT) Lynne Leader    Onset Date/Surgical Date 05/11/21    Next MD Visit 07/24/21    Prior Therapy long time ago for knee surgery      AROM   Overall AROM Comments assessed in standing    Left Shoulder Extension 50 Degrees    Left Shoulder Flexion 145 Degrees   with pain at end range   Left Shoulder ABduction 107 Degrees   with pain.   Left Shoulder External Rotation 90 Degrees               [facility] Adult PT Treatment/Exercise - 07/16/21 0001       Self-Care   Self-Care Other Self-Care Comments    Other Self-Care Comments  pt instructed in self massage to post/ant Lt shoulder with ball against wall.  Pt returned demo with cues.      Shoulder Exercises: Supine   Horizontal ABduction Both;10 reps;Strengthening    Theraband Level (Shoulder Horizontal ABduction) Level 1 (Yellow)    External Rotation Strengthening;Both;10 reps   2 sets, one with each color  Theraband Level (Shoulder External Rotation) Level 2 (Red);Level 1 (Yellow)   red increased pain; no pain with yellow band   Flexion AROM;AAROM;Both;10 reps   overhead reach holding red band between hands   Theraband Level (Shoulder Flexion) Level 2 (Red)    Flexion Limitations range to tolerance      Shoulder Exercises: Seated   Row Both;15 reps    Theraband Level (Shoulder Row) Level 1 (Yellow)      Shoulder Exercises: Sidelying   Other Sidelying Exercises open book thoracic rotation with head following hand, range to tolerance x 5 reps to Rt.      Shoulder Exercises: ROM/Strengthening   UBE (Upper Arm Bike) L2: 1.5 min each direction.      Shoulder Exercises: Stretch   Star Gazer Stretch 3 reps;20 seconds   back of palms to forehead in supine, elbow supported by pillow.   Other Shoulder Stretches 2 position doorway stretch for pecs x 15 sec x 2 reps each position.  bilat bicep stretch holding door frame x 15 sec x 2 reps      Manual Therapy    Soft tissue mobilization STM to Lt upper trap, levator, SCM, cervical paraspinals    Passive ROM pin and stretch to Lt scalenes                       PT Short Term Goals - 07/09/21 1731       PT SHORT TERM GOAL #1   Title Will be independent in progressive HEP to be updated PRN    Status Achieved               PT Long Term Goals - 06/10/21 1752       PT LONG TERM GOAL #1   Title MMT to have improved by at least 1 grade in all tested groups    Time 6    Period Weeks    Status New    Target Date 07/22/21      PT LONG TERM GOAL #2   Title Will be able to reach overhead to get a 5-10# item from a shelf with BUEs without increase in pain    Time 6    Period Weeks    Status New      PT LONG TERM GOAL #3   Title Will be able to perform functional lifting tasks and functional activities related to home/child care without increase in pain    Time 6    Period Weeks    Status New      PT LONG TERM GOAL #4   Title Will be able to tolerate sitting to perform work based tasks for at least 60 minutes without an increase in pain or stiffness    Time 6    Period Weeks    Status New                   Plan - 07/16/21 1603     Clinical Impression Statement Pt's Lt shoulder ROM gradually improving with less pain.  She reported some increased discomfort in ant shoulder and upper trap on L with resisted exercises; all completed within tolerance.  Positive response to DN to neck/ upper trap area last visit; may benefit from further manual therapy to upper quarter to decrease fascial restrictions and improve mobility.  Plan to progress towards remaining goals.    Personal Factors and Comorbidities Fitness;Comorbidity 1;Education;Time since onset of injury/illness/exacerbation    Rehab  Potential Excellent    PT Frequency 2x / week    PT Duration 6 weeks    PT Treatment/Interventions Cryotherapy;Moist Heat;Electrical Stimulation;Iontophoresis 33m/ml  Dexamethasone;Ultrasound;Therapeutic exercise;Neuromuscular re-education;Manual techniques;Joint Manipulations;Spinal Manipulations;Therapeutic activities;ADLs/Self Care Home Management;Patient/family education    PT Next Visit Plan assess goals for renewal and send MD note for upcoming appt.    PT Home Exercise Plan [id number]   Consulted and Agree with Plan of Care Patient             Patient will benefit from skilled therapeutic intervention in order to improve the following deficits and impairments:  Decreased range of motion, Increased fascial restricitons, Increased muscle spasms, Impaired UE functional use, Obesity, Pain, Impaired flexibility, Improper body mechanics, Decreased strength, Postural dysfunction  Visit Diagnosis: Acute pain of left shoulder  Muscle weakness (generalized)  Pain in thoracic spine  Abnormal posture     Problem List Patient Active Problem List   Diagnosis Date Noted   Status post repeat low transverse cesarean section 05/18/2015    Class: Status post   S/P cesarean section 05/18/2015   Advanced maternal age in multigravida 01/07/2015   Previous pregnancy complicated by chromosomal abnormality, antepartum    AMA (advanced maternal age) multigravida 35+    Encounter for fetal anatomic survey    [redacted] weeks gestation of pregnancy    Maternal morbid obesity, antepartum ([place]    Personal history of DVT (deep vein thrombosis)    Encounter for preconception consultation    Pain in limb 11/28/2013   Acute D[place] (deep [place] thrombosis) ([place] 07/11/2013   JKerin Perna PTA 07/16/21 4:09 PM   [facility] [address] [place] [address]Phone: [phone]  Fax:  [phone] Name: HSHANNEN FLANSBURGMRN: [id number] of Birth: 210-31-1982  PHYSICAL THERAPY DISCHARGE SUMMARY  Visits from Start of Care: 4  Current functional level related to goals / functional  outcomes:   Refer to above clinical impression for status as of last visit on 07/16/2021. Patient cancelled all remaining scheduled appointments and has not returned to PT in >30 days, therefore will proceed with discharge from PT for this episode.   Remaining deficits:   As above. Unable to formally assess status at discharge due to failure to return to PT.   Education / Equipment:   HEP   Patient agrees to discharge. Patient goals were partially met. Patient is being discharged due to not returning since the last visit.  JPercival Spanish PT, MPT 09/11/21, 9:04 AM  [facility] [place] [address]Phone: [phone]  Fax:  [phone]

## 2021-07-17 ENCOUNTER — Ambulatory Visit: Payer: Medicaid Other | Admitting: Physical Therapy

## 2021-07-21 ENCOUNTER — Ambulatory Visit: Payer: Medicaid Other | Admitting: Physical Therapy

## 2021-07-24 ENCOUNTER — Encounter: Payer: Self-pay | Admitting: Family Medicine

## 2021-07-24 ENCOUNTER — Ambulatory Visit (INDEPENDENT_AMBULATORY_CARE_PROVIDER_SITE_OTHER): Payer: Medicaid Other | Admitting: Family Medicine

## 2021-07-24 ENCOUNTER — Ambulatory Visit (INDEPENDENT_AMBULATORY_CARE_PROVIDER_SITE_OTHER): Payer: Medicaid Other

## 2021-07-24 ENCOUNTER — Other Ambulatory Visit: Payer: Self-pay

## 2021-07-24 ENCOUNTER — Ambulatory Visit: Payer: Self-pay

## 2021-07-24 VITALS — BP 110/72 | HR 100 | Ht 59.0 in | Wt 192.0 lb

## 2021-07-24 DIAGNOSIS — G2589 Other specified extrapyramidal and movement disorders: Secondary | ICD-10-CM | POA: Diagnosis not present

## 2021-07-24 DIAGNOSIS — M25512 Pain in left shoulder: Secondary | ICD-10-CM

## 2021-07-24 DIAGNOSIS — M542 Cervicalgia: Secondary | ICD-10-CM | POA: Diagnosis not present

## 2021-07-24 DIAGNOSIS — M9908 Segmental and somatic dysfunction of rib cage: Secondary | ICD-10-CM

## 2021-07-24 DIAGNOSIS — M9904 Segmental and somatic dysfunction of sacral region: Secondary | ICD-10-CM

## 2021-07-24 DIAGNOSIS — M9901 Segmental and somatic dysfunction of cervical region: Secondary | ICD-10-CM | POA: Diagnosis not present

## 2021-07-24 DIAGNOSIS — M9903 Segmental and somatic dysfunction of lumbar region: Secondary | ICD-10-CM | POA: Diagnosis not present

## 2021-07-24 DIAGNOSIS — M9902 Segmental and somatic dysfunction of thoracic region: Secondary | ICD-10-CM | POA: Diagnosis not present

## 2021-07-24 MED ORDER — TIZANIDINE HCL 2 MG PO TABS: 2.0000 mg | ORAL_TABLET | Freq: Every day | ORAL | 0 refills | Status: DC
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
## 2021-07-24 NOTE — Assessment & Plan Note (Signed)

## 2021-07-24 NOTE — Assessment & Plan Note (Signed)
Patient is having more scapular decubitus on the left side.  Ultrasound of patient's shoulder left unremarkable.  Responded extremely well to osteopathic manipulation.  Patient has not been taking the Robaxin on her chart and we will try a lower dose of Zanaflex.  Patient will follow up with the physical therapy.  Follow-up again in 6 to 8 weeks

## 2021-07-24 NOTE — Patient Instructions (Addendum)
Zanaflex 2mg  at night as needed Continue exercises PT gave you Did manipulation and think you'll feel great See you again in 4-6 weeks

## 2021-07-27 ENCOUNTER — Ambulatory Visit: Payer: Medicaid Other | Admitting: Family Medicine

## 2021-07-29 ENCOUNTER — Ambulatory Visit: Payer: Medicaid Other

## 2021-08-06 ENCOUNTER — Ambulatory Visit: Payer: Medicaid Other | Admitting: Physical Therapy

## 2021-08-11 ENCOUNTER — Ambulatory Visit: Payer: Medicaid Other | Admitting: Family Medicine

## 2021-09-08 NOTE — Progress Notes (Signed)
Krista [name] Phone: [phone] Subjective:   Krista [name], am serving as a scribe for Dr. Hulan Saas.This visit occurred during the SARS-CoV-2 public health emergency.  Safety protocols were in place, including screening questions prior to the visit, additional usage of staff PPE, and extensive cleaning of exam room while observing appropriate contact time as indicated for disinfecting solutions.  I'm seeing this patient by the request  of:  Myrlene Broker, MD  CC: Neck and back pain follow-up  [id number]  Krista [name] is a 41 y.o. female coming in with complaint of back and neck pain. OMT 07/24/2021. Also f/u for L shoulder pain. Patient states that she is having tightness in thoracic and cervical spine. Patient notes weakness and soreness in shoulder with flexion and abduction.   Medications patient has been prescribed: Zanaflex prn  Taking:         Reviewed prior external information including notes and imaging from previsou exam, outside providers and external EMR if available.   As well as notes that were available from care everywhere and other healthcare systems.  Past medical history, social, surgical and family history all reviewed in electronic medical record.  [name] pertanent information unless stated regarding to the chief complaint.   Past Medical History:  Diagnosis Date   Asthma    exercise induced   DVT (deep venous thrombosis) (HCC)    Obesity     Allergies  Allergen Reactions   Prednisone Hives    Lips and tongue swell     Review of Systems:  [name] headache, visual changes, nausea, vomiting, diarrhea, constipation, dizziness, abdominal pain, skin rash, fevers, chills, night sweats, weight loss, swollen lymph nodes, body aches, joint swelling, chest pain, shortness of breath, mood changes. POSITIVE muscle aches  Objective  Blood pressure 100/70, pulse 68, height 4\' 11"  (1.499  m), weight 179 lb (81.2 kg), last menstrual period 09/06/2014, SpO2 97 %, unknown if currently breastfeeding.   General: [name] apparent distress alert and oriented x3 mood and affect normal, dressed appropriately.  HEENT: Pupils equal, extraocular movements intact  Respiratory: Patient's speak in full sentences and does not appear short of breath  Cardiovascular: [name] lower extremity edema, non tender, [name] erythema  Neuro: Cranial nerves II through XII are intact, neurovascularly intact in all extremities with 2+  Hypermobility noted of multiple joints.  Patient still has tenderness to palpation mostly in the right parascapular region.  Patient does have also in the left side.  Left shoulder does have positive impingement still noted.  Procedure: Real-time Ultrasound Guided Injection of left glenohumeral joint Device: GE Logiq E  Ultrasound guided injection is preferred based studies that show increased duration, increased effect, greater accuracy, decreased procedural pain, increased response rate with ultrasound guided versus blind injection.  Verbal informed consent obtained.  Time-out conducted.  Noted [name] overlying erythema, induration, or other signs of local infection.  Skin prepped in a sterile fashion.  Local anesthesia: Topical Ethyl chloride.  With sterile technique and under real time ultrasound guidance:  Joint visualized.  21g 2 inch needle inserted posterior approach. Pictures taken for needle placement. Patient did have injection of 2 cc of 0.5% Marcaine, and 1cc of Kenalog 40 mg/dL. Completed without difficulty  Pain immediately resolved suggesting accurate placement of the medication.  Advised to call if fevers/chills, erythema, induration, drainage, or persistent bleeding.  Impression: Technically successful ultrasound guided injection.   Osteopathic findings  C6 flexed  rotated and side bent left T3 extended rotated and side bent right inhaled rib T7 extended rotated and side  bent left L2 flexed rotated and side bent right Sacrum right on right       Assessment and Plan:  Scapular dyskinesis Continued scapular dyskinesis.  Discussed icing regimen and home exercises.  Discussed which activities to doing which wants to avoid.  Increase activity slowly.  Follow-up again in 6 to 7 weeks   Nonallopathic problems  Decision today to treat with OMT was based on Physical Exam  After verbal consent patient was treated with HVLA, ME, FPR techniques in cervical, rib, thoracic, lumbar, and sacral  areas  Patient tolerated the procedure well with improvement in symptoms  Patient given exercises, stretches and lifestyle modifications  See medications in patient instructions if given  Patient will follow up in 4-8 weeks     The above documentation has been reviewed and is accurate and complete Lyndal Pulley, DO        Note: This dictation was prepared with Dragon dictation along with smaller phrase technology. Any transcriptional errors that result from this process are unintentional.

## 2021-09-09 ENCOUNTER — Encounter: Payer: Self-pay | Admitting: Family Medicine

## 2021-09-09 ENCOUNTER — Other Ambulatory Visit: Payer: Self-pay

## 2021-09-09 ENCOUNTER — Ambulatory Visit: Payer: Self-pay

## 2021-09-09 ENCOUNTER — Ambulatory Visit (INDEPENDENT_AMBULATORY_CARE_PROVIDER_SITE_OTHER): Payer: BC Managed Care – PPO | Admitting: Family Medicine

## 2021-09-09 VITALS — BP 100/70 | HR 68 | Ht 59.0 in | Wt 179.0 lb

## 2021-09-09 DIAGNOSIS — G2589 Other specified extrapyramidal and movement disorders: Secondary | ICD-10-CM | POA: Diagnosis not present

## 2021-09-09 DIAGNOSIS — M9902 Segmental and somatic dysfunction of thoracic region: Secondary | ICD-10-CM

## 2021-09-09 DIAGNOSIS — M9901 Segmental and somatic dysfunction of cervical region: Secondary | ICD-10-CM | POA: Diagnosis not present

## 2021-09-09 DIAGNOSIS — M9903 Segmental and somatic dysfunction of lumbar region: Secondary | ICD-10-CM | POA: Diagnosis not present

## 2021-09-09 DIAGNOSIS — M9908 Segmental and somatic dysfunction of rib cage: Secondary | ICD-10-CM | POA: Diagnosis not present

## 2021-09-09 DIAGNOSIS — M25512 Pain in left shoulder: Secondary | ICD-10-CM

## 2021-09-09 DIAGNOSIS — M9904 Segmental and somatic dysfunction of sacral region: Secondary | ICD-10-CM

## 2021-09-09 NOTE — Patient Instructions (Signed)
Give it a day before lifting Injection today See me in 5-6 weeks

## 2021-09-09 NOTE — Assessment & Plan Note (Addendum)
Patient does have significant discomfort and pain.  Patient given injection today.  Patient did have some improvement in range of motion.  Could be more of secondary to bursitis.  If continuing to have difficulty we may want to consider advanced imaging but hopefully this would not be necessary.  Follow-up with me again in 6 weeks

## 2021-09-09 NOTE — Assessment & Plan Note (Signed)
Continued scapular dyskinesis.  Discussed icing regimen and home exercises.  Discussed which activities to doing which wants to avoid.  Increase activity slowly.  Follow-up again in 6 to 7 weeks

## 2021-10-19 NOTE — Progress Notes (Unsigned)
°  [name] [address] [place] [address] Phone: [phone] Subjective:    I'm seeing this patient by the request  of:  Hadley Pen, MD  CC:   [id number]  Krista [name] is a 41 y.o. female coming in with complaint of back and neck pain. OMT 09/09/2021. Patient states   Medications patient has been prescribed: Zanaflex  Taking:         Reviewed prior external information including notes and imaging from previsou exam, outside providers and external EMR if available.   As well as notes that were available from care everywhere and other healthcare systems.  Past medical history, social, surgical and family history all reviewed in electronic medical record.  No pertanent information unless stated regarding to the chief complaint.   Past Medical History:  Diagnosis Date   Asthma    exercise induced   DVT (deep venous thrombosis) (HCC)    Obesity     Allergies  Allergen Reactions   Prednisone Hives    Lips and tongue swell     Review of Systems:  No headache, visual changes, nausea, vomiting, diarrhea, constipation, dizziness, abdominal pain, skin rash, fevers, chills, night sweats, weight loss, swollen lymph nodes, body aches, joint swelling, chest pain, shortness of breath, mood changes. POSITIVE muscle aches  Objective  Last menstrual period 09/06/2014, unknown if currently breastfeeding.   General: No apparent distress alert and oriented x3 mood and affect normal, dressed appropriately.  HEENT: Pupils equal, extraocular movements intact  Respiratory: Patient's speak in full sentences and does not appear short of breath  Cardiovascular: No lower extremity edema, non tender, no erythema  Neuro: Cranial nerves II through XII are intact, neurovascularly intact in all extremities with 2+ DTRs and 2+ pulses.  Gait normal with good balance and coordination.  MSK:  Non tender with full range of motion and good stability  and symmetric strength and tone of shoulders, elbows, wrist, hip, knee and ankles bilaterally.  Back - Normal skin, Spine with normal alignment and no deformity.  No tenderness to vertebral process palpation.  Paraspinous muscles are not tender and without spasm.   Range of motion is full at neck and lumbar sacral regions  Osteopathic findings  C2 flexed rotated and side bent right C6 flexed rotated and side bent left T3 extended rotated and side bent right inhaled rib T9 extended rotated and side bent left L2 flexed rotated and side bent right Sacrum right on right       Assessment and Plan:    Nonallopathic problems  Decision today to treat with OMT was based on Physical Exam  After verbal consent patient was treated with HVLA, ME, FPR techniques in cervical, rib, thoracic, lumbar, and sacral  areas  Patient tolerated the procedure well with improvement in symptoms  Patient given exercises, stretches and lifestyle modifications  See medications in patient instructions if given  Patient will follow up in 4-8 weeks      The above documentation has been reviewed and is accurate and complete Krista [name]       Note: This dictation was prepared with Dragon dictation along with smaller phrase technology. Any transcriptional errors that result from this process are unintentional.

## 2021-10-22 ENCOUNTER — Other Ambulatory Visit: Payer: Self-pay

## 2021-10-22 ENCOUNTER — Ambulatory Visit: Payer: Self-pay

## 2021-10-22 ENCOUNTER — Ambulatory Visit (INDEPENDENT_AMBULATORY_CARE_PROVIDER_SITE_OTHER): Payer: BC Managed Care – PPO | Admitting: Family Medicine

## 2021-10-22 VITALS — BP 102/78 | HR 65 | Ht 59.0 in | Wt 164.8 lb

## 2021-10-22 DIAGNOSIS — M9901 Segmental and somatic dysfunction of cervical region: Secondary | ICD-10-CM | POA: Diagnosis not present

## 2021-10-22 DIAGNOSIS — M9902 Segmental and somatic dysfunction of thoracic region: Secondary | ICD-10-CM

## 2021-10-22 DIAGNOSIS — M25512 Pain in left shoulder: Secondary | ICD-10-CM

## 2021-10-22 DIAGNOSIS — G2589 Other specified extrapyramidal and movement disorders: Secondary | ICD-10-CM | POA: Diagnosis not present

## 2021-10-22 DIAGNOSIS — M9908 Segmental and somatic dysfunction of rib cage: Secondary | ICD-10-CM

## 2021-10-22 DIAGNOSIS — M9903 Segmental and somatic dysfunction of lumbar region: Secondary | ICD-10-CM | POA: Diagnosis not present

## 2021-10-22 DIAGNOSIS — M9904 Segmental and somatic dysfunction of sacral region: Secondary | ICD-10-CM | POA: Diagnosis not present

## 2021-10-22 NOTE — Patient Instructions (Addendum)
Good to see you.  ? ?I've ordered an MRI arth of your L shoulder. ? ?Keep doing your exercises for scap and upper back. ? ?See me back in 5-6 weeks no matter what. ? ?We'll discuss your MRI results over MyChart. ? ? ? ?

## 2021-10-22 NOTE — Assessment & Plan Note (Signed)
Chronic problem with exacerbation. ?This could be potentially compensation secondary to the shoulder.  Discussed home exercises continue to work on the scapular stability exercises.  Patient can do topical anti-inflammatories and icing regimen which we have discussed previously.  Follow-up with me again in 5 to 6 weeks.  Patient does respond well to osteopathic manipulation ?

## 2021-10-22 NOTE — Assessment & Plan Note (Signed)
Patient responded immediately.  On ultrasound today does show that patient has what appears to be more 6 subscapularis partial tear potentially noted on ultrasound.  Patient has failed all other conservative therapy including 6 weeks of formal physical therapy, home exercises and no injections.  At this point I do feel that advanced imaging would be quite beneficial.  We also had difficulty doing any other treatment secondary to patient having gastric bypass surgery.  Depending on the MR arthrogram then we will discuss surgical intervention versus continued conservative therapy. ?

## 2021-11-13 ENCOUNTER — Ambulatory Visit
Admission: RE | Admit: 2021-11-13 | Discharge: 2021-11-13 | Disposition: A | Discharge status: Home / Self Care | Payer: BC Managed Care – PPO | Source: Ambulatory Visit | Attending: Family Medicine | Admitting: Family Medicine

## 2021-11-13 DIAGNOSIS — M25512 Pain in left shoulder: Secondary | ICD-10-CM

## 2021-11-13 MED ORDER — IOPAMIDOL (ISOVUE-M 200) INJECTION 41%
13.0000 mL | Freq: Once | INTRAMUSCULAR | Status: AC
  Administered 2021-11-13: 13 mL via INTRA_ARTICULAR

## 2021-11-16 ENCOUNTER — Encounter: Payer: Self-pay | Admitting: Family Medicine

## 2021-11-26 NOTE — Progress Notes (Signed)
?Terrilee Files D.O. ?[facility] ?[address] [place] [address] ?Phone: [phone] ?Subjective:   ?I, Wilford Grist, am serving as a scribe for Dr. Antoine Primas. ? ?This visit occurred during the SARS-CoV-2 public health emergency.  Safety protocols were in place, including screening questions prior to the visit, additional usage of staff PPE, and extensive cleaning of exam room while observing appropriate contact time as indicated for disinfecting solutions.  ? ? ?I'm seeing this patient by the request  of:  Hadley Pen, MD ? ?CC: Left shoulder, neck and back pain follow-up ? ?[id number]  ?Krista [name] is a 41 y.o. female coming in with complaint of back and neck pain. Also f/u for L shoulder pain. Patient states that her shoulder is worse. Pain radiating into L scapula. Using Tylenol for pain relief.  ? ?Medications patient has been prescribed: Zanaflex ? ?Taking: ? ?MRI 11/13/2021 L shoulder ?IMPRESSION: ?1. Mild rotator cuff tendinosis without tear. ?2. Bifid long head biceps tendon versus longitudinal split tear. ?3. Intact labrum. ?  ?  ? ? ? ? ?Reviewed prior external information including notes and imaging from previsou exam, outside providers and external EMR if available.  ? ?As well as notes that were available from care everywhere and other healthcare systems. ? ?Past medical history, social, surgical and family history all reviewed in electronic medical record.  No pertanent information unless stated regarding to the chief complaint.  ? ?Past Medical History:  ?Diagnosis Date  ? Asthma   ? exercise induced  ? DVT (deep venous thrombosis) (HCC)   ? Obesity   ?  ?Allergies  ?Allergen Reactions  ? Prednisone Hives  ?  Lips and tongue swell  ? ? ? ?Review of Systems: ? No headache, visual changes, nausea, vomiting, diarrhea, constipation, dizziness, abdominal pain, skin rash, fevers, chills, night sweats, weight loss, swollen lymph nodes, body aches, joint swelling, chest  pain, shortness of breath, mood changes. POSITIVE muscle aches ? ?Objective  ?Blood pressure 100/72, pulse 75, height 4\' 11"  (1.499 m), weight 157 lb (71.2 kg), last menstrual period 09/06/2014, SpO2 99 %, unknown if currently breastfeeding. ?  ?General: No apparent distress alert and oriented x3 mood and affect normal, dressed appropriately.  ?HEENT: Pupils equal, extraocular movements intact  ?Respiratory: Patient's speak in full sentences and does not appear short of breath  ?Cardiovascular: No lower extremity edema, non tender, no erythema  ?Left shoulder exam does have positive impingement noted.  Neer and Hawkins noted.  4+ out of 5 strength of the rotator cuff noted. ? ?Osteopathic findings ? ?C2 flexed rotated and side bent right ?C7 flexed rotated and side bent left ?T3 extended rotated and side bent right inhaled rib ?T9 extended rotated and side bent left ?L2 flexed rotated and side bent right ?Sacrum right on right ? ? ?Procedure: Real-time Ultrasound Guided Injection of left glenohumeral joint ?Device: GE Logiq E  ?Ultrasound guided injection is preferred based studies that show increased duration, increased effect, greater accuracy, decreased procedural pain, increased response rate with ultrasound guided versus blind injection.  ?Verbal informed consent obtained.  ?Time-out conducted.  ?Noted no overlying erythema, induration, or other signs of local infection.  ?Skin prepped in a sterile fashion.  ?Local anesthesia: Topical Ethyl chloride.  ?With sterile technique and under real time ultrasound guidance:  Joint visualized.  21g 2 inch needle inserted lateral approach. Pictures taken for needle placement. Patient did have injection of 2 cc of 0.5% Marcaine, and 1cc of Kenalog  40 mg/dL. ?Completed without difficulty  ?Pain immediately resolved suggesting accurate placement of the medication.  ?Advised to call if fevers/chills, erythema, induration, drainage, or persistent bleeding.  ?Impression:  Technically successful ultrasound guided injection. ? ?Procedure: Real-time Ultrasound Guided Injection of left acromioclavicular joint ?Device: GE Logiq Q7 ?Ultrasound guided injection is preferred based studies that show increased duration, increased effect, greater accuracy, decreased procedural pain, increased response rate, and decreased cost with ultrasound guided versus blind injection.  ?Verbal informed consent obtained.  ?Time-out conducted.  ?Noted no overlying erythema, induration, or other signs of local infection.  ?Skin prepped in a sterile fashion.  ?Local anesthesia: Topical Ethyl chloride.  ?With sterile technique and under real time ultrasound guidance: With a 25-gauge half inch needle injecting 0.5 cc of 0.5% Marcaine and 0.5 cc of Kenalog 40 mg/mL ?Completed without difficulty  ?Pain immediately resolved suggesting accurate placement of the medication.  ?Advised to call if fevers/chills, erythema, induration, drainage, or persistent bleeding.  ?Impression: Technically successful ultrasound guided injection. ? ? ?  ?Assessment and Plan: ? ?Left shoulder pain ?Chronic problem with mild exacerbation.  Given injection more dull in the subacromial space this time.  In addition to this given a AC injection.  Tolerated the procedure well, discussed icing regimen and home exercises, increase activity slowly.  Candidate for 6 weeks could be  ? ?Nonallopathic problems ? ?Decision today to treat with OMT was based on Physical Exam ? ?After verbal consent patient was treated with HVLA, ME, FPR techniques in cervical, rib, thoracic, lumbar, and sacral  areas ? ?Patient tolerated the procedure well with improvement in symptoms ? ?Patient given exercises, stretches and lifestyle modifications ? ?See medications in patient instructions if given ? ?Patient will follow up in 4-8 weeks ? ?  ? ? ?The above documentation has been reviewed and is accurate and complete Judi Saa, DO ? ? ? ?  ? ? Note: This  dictation was prepared with Dragon dictation along with smaller phrase technology. Any transcriptional errors that result from this process are unintentional.    ?  ?  ? ?

## 2021-11-30 ENCOUNTER — Ambulatory Visit (INDEPENDENT_AMBULATORY_CARE_PROVIDER_SITE_OTHER): Payer: BC Managed Care – PPO | Admitting: Family Medicine

## 2021-11-30 ENCOUNTER — Ambulatory Visit: Payer: Self-pay

## 2021-11-30 VITALS — BP 100/72 | HR 75 | Ht 59.0 in | Wt 157.0 lb

## 2021-11-30 DIAGNOSIS — M25512 Pain in left shoulder: Secondary | ICD-10-CM

## 2021-11-30 DIAGNOSIS — M9904 Segmental and somatic dysfunction of sacral region: Secondary | ICD-10-CM | POA: Diagnosis not present

## 2021-11-30 DIAGNOSIS — M25511 Pain in right shoulder: Secondary | ICD-10-CM | POA: Diagnosis not present

## 2021-11-30 DIAGNOSIS — M9902 Segmental and somatic dysfunction of thoracic region: Secondary | ICD-10-CM | POA: Diagnosis not present

## 2021-11-30 DIAGNOSIS — M9901 Segmental and somatic dysfunction of cervical region: Secondary | ICD-10-CM | POA: Diagnosis not present

## 2021-11-30 DIAGNOSIS — M9903 Segmental and somatic dysfunction of lumbar region: Secondary | ICD-10-CM | POA: Diagnosis not present

## 2021-11-30 DIAGNOSIS — M9908 Segmental and somatic dysfunction of rib cage: Secondary | ICD-10-CM

## 2021-11-30 DIAGNOSIS — M25519 Pain in unspecified shoulder: Secondary | ICD-10-CM | POA: Insufficient documentation

## 2021-11-30 NOTE — Assessment & Plan Note (Signed)
Chronic problem with mild exacerbation.  Given injection more dull in the subacromial space this time.  In addition to this given a AC injection.  Tolerated the procedure well, discussed icing regimen and home exercises, increase activity slowly.  Candidate for 6 weeks could be ?
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
## 2021-11-30 NOTE — Assessment & Plan Note (Signed)
Injection given and tolerated the procedure well, discussed icing regimen and home exercise, which activities to do which ones to avoid, increase activity slowly.  Follow-up again in 6 to 8 weeks ?

## 2021-12-22 ENCOUNTER — Encounter: Payer: Self-pay | Admitting: Family Medicine

## 2021-12-31 NOTE — Progress Notes (Signed)
[name] [address] [place] [address] Phone: [phone] Subjective:   Krista [name], am serving as a scribe for Dr. Antoine Primas.   I'm seeing this patient by the request  of:  Hadley Pen, MD  CC: back and neck pain   [id number]  11/30/2021 Injection given and tolerated the procedure well, discussed icing regimen and home exercise, which activities to do which ones to avoid, increase activity slowly.  Follow-up again in 6 to 8 weeks  Chronic problem with mild exacerbation.  Given injection more dull in the subacromial space this time.  In addition to this given a AC injection.  Tolerated the procedure well, discussed icing regimen and home exercises, increase activity slowly.  Candidate for 6 weeks could be  Update 01/05/2022 Krista [name] is a 41 y.o. female coming in with complaint of back and neck pain. Also f/u for L shoulder pain. Injection given noted as well   Patient states that she is doing better. Has burning between scapula. Pain in shoulder is with flexion and extension.   Medications patient has been prescribed: None  Taking:         Reviewed prior external information including notes and imaging from previsou exam, outside providers and external EMR if available.   As well as notes that were available from care everywhere and other healthcare systems.  Past medical history, social, surgical and family history all reviewed in electronic medical record.  No pertanent information unless stated regarding to the chief complaint.   Past Medical History:  Diagnosis Date   Asthma    exercise induced   DVT (deep venous thrombosis) (HCC)    Obesity     Allergies  Allergen Reactions   Prednisone Hives    Lips and tongue swell     Review of Systems:  No headache, visual changes, nausea, vomiting, diarrhea, constipation, dizziness, abdominal pain, skin rash, fevers, chills, night sweats, weight loss,  swollen lymph nodes, body aches, joint swelling, chest pain, shortness of breath, mood changes. POSITIVE muscle aches  Objective  Blood pressure 102/72, pulse 62, height 4\' 11"  (1.499 m), weight 147 lb (66.7 kg), last menstrual period 09/06/2014, SpO2 99 %, unknown if currently breastfeeding.   General: No apparent distress alert and oriented x3 mood and affect normal, dressed appropriately.  HEENT: Pupils equal, extraocular movements intact  Respiratory: Patient's speak in full sentences and does not appear short of breath  Cardiovascular: No lower extremity edema, non tender, no erythema  Hypermobility noted.  Patient does have some tenderness to palpation minorly over the acromioclavicular joint on the left shoulder.  Patient now has made improvement though with range of motion and a little bit of pain with cross shoulder. Hypermobility of other joints noted.  Patient still though does have poor posture noted.  Has difficulty with even sure retraction of the shoulders.  Osteopathic findings  C2 flexed rotated and side bent left C6 flexed rotated and side bent left T3 extended rotated and side bent right inhaled rib T8 extended rotated and side bent left L2 flexed rotated and side bent right Sacrum right on right  Limited muscular skeletal ultrasound was performed and interpreted by 09/08/2014, M  Limited ultrasound shows very mild hypoechoic changes in the subacromial space.  Significant decrease in hypoechoic changes of the acromioclavicular joint.  Rotator cuff and does have some degenerative changes but no acute tearing appreciated. Impression: Interval improvement     Assessment and  Plan:  AC joint pain Appears to be doing better overall.  Discussed icing regimen and home exercises, which activities. Is to avoid, increase activity slowly.  Follow-up again in 8 to 10 weeks.  Left shoulder pain MRI did show a very small split tear of the bicep tendon but overall seems to be  doing much better.  Ultrasound today did not see significant tearing but does see some scar tissue formation that would be consistent with a healing tear.  Patient will increase activity and follow-up with me again in 8 to 10 weeks  Scapular dyskinesis Continues to have significant difficulty with scapular dyskinesis as well as weakness noted of the left shoulder girdle with poor posture.  Encourage patient to continue to work on Financial controller.  Discussed icing regimen and home exercises.  Responding to manipulation.  Follow-up again in 8 to 10 weeks.    Nonallopathic problems  Decision today to treat with OMT was based on Physical Exam  After verbal consent patient was treated with HVLA, ME, FPR techniques in cervical, rib, thoracic, lumbar, and sacral  areas  Patient tolerated the procedure well with improvement in symptoms  Patient given exercises, stretches and lifestyle modifications  See medications in patient instructions if given  Patient will follow up in 4-8 weeks      The above documentation has been reviewed and is accurate and complete Judi Saa, DO        Note: This dictation was prepared with Dragon dictation along with smaller phrase technology. Any transcriptional errors that result from this process are unintentional.

## 2022-01-06 ENCOUNTER — Ambulatory Visit (INDEPENDENT_AMBULATORY_CARE_PROVIDER_SITE_OTHER): Payer: BC Managed Care – PPO | Admitting: Family Medicine

## 2022-01-06 ENCOUNTER — Ambulatory Visit: Payer: Self-pay

## 2022-01-06 ENCOUNTER — Encounter: Payer: Self-pay | Admitting: Family Medicine

## 2022-01-06 VITALS — BP 102/72 | HR 62 | Ht 59.0 in | Wt 147.0 lb

## 2022-01-06 DIAGNOSIS — M9901 Segmental and somatic dysfunction of cervical region: Secondary | ICD-10-CM

## 2022-01-06 DIAGNOSIS — G2589 Other specified extrapyramidal and movement disorders: Secondary | ICD-10-CM

## 2022-01-06 DIAGNOSIS — M25512 Pain in left shoulder: Secondary | ICD-10-CM

## 2022-01-06 DIAGNOSIS — M9902 Segmental and somatic dysfunction of thoracic region: Secondary | ICD-10-CM

## 2022-01-06 DIAGNOSIS — M9908 Segmental and somatic dysfunction of rib cage: Secondary | ICD-10-CM | POA: Diagnosis not present

## 2022-01-06 DIAGNOSIS — M9904 Segmental and somatic dysfunction of sacral region: Secondary | ICD-10-CM | POA: Diagnosis not present

## 2022-01-06 DIAGNOSIS — M9903 Segmental and somatic dysfunction of lumbar region: Secondary | ICD-10-CM

## 2022-01-06 DIAGNOSIS — M25511 Pain in right shoulder: Secondary | ICD-10-CM

## 2022-01-06 DIAGNOSIS — G8929 Other chronic pain: Secondary | ICD-10-CM | POA: Diagnosis not present

## 2022-01-06 NOTE — Assessment & Plan Note (Signed)
Appears to be doing better overall.  Discussed icing regimen and home exercises, which activities. Is to avoid, increase activity slowly.  Follow-up again in 8 to 10 weeks.

## 2022-01-06 NOTE — Assessment & Plan Note (Signed)
MRI did show a very small split tear of the bicep tendon but overall seems to be doing much better.  Ultrasound today did not see significant tearing but does see some scar tissue formation that would be consistent with a healing tear.  Patient will increase activity and follow-up with me again in 8 to 10 weeks

## 2022-01-06 NOTE — Patient Instructions (Signed)
Good to see you Keep working on posture Consider yoga wheel  See me in 8-10weeks

## 2022-01-06 NOTE — Assessment & Plan Note (Signed)
Continues to have significant difficulty with scapular dyskinesis as well as weakness noted of the left shoulder girdle with poor posture.  Encourage patient to continue to work on Financial controller.  Discussed icing regimen and home exercises.  Responding to manipulation.  Follow-up again in 8 to 10 weeks.

## 2022-03-05 NOTE — Progress Notes (Unsigned)
  [name] [address] [place] [address] Phone: [phone] Subjective:   Bruce Donath, am serving as a scribe for Dr. Antoine Primas.   I'm seeing this patient by the request  of:  Hadley Pen, MD  CC: Back and neck pain follow-up  [id number]  ALISANDRA SON is a 41 y.o. female coming in with complaint of back and neck pain. OMT 01/06/2022. Patient states that her shoulder and back have been doing much better. She does have intermittent burning in thoracic spine.   Medications patient has been prescribed: None  Taking:         Past Medical History:  Diagnosis Date   Asthma    exercise induced   DVT (deep venous thrombosis) (HCC)    Obesity     Allergies  Allergen Reactions   Prednisone Hives    Lips and tongue swell     Review of Systems:  No headache, visual changes, nausea, vomiting, diarrhea, constipation, dizziness, abdominal pain, skin rash, fevers, chills, night sweats, weight loss, swollen lymph nodes, body aches, joint swelling, chest pain, shortness of breath, mood changes. POSITIVE muscle aches  Objective  Blood pressure 112/76, pulse 70, height 4\' 11"  (1.499 m), weight 138 lb (62.6 kg), last menstrual period 09/06/2014, SpO2 98 %, unknown if currently breastfeeding.   General: No apparent distress alert and oriented x3 mood and affect normal, dressed appropriately.  HEENT: Pupils equal, extraocular movements intact  Respiratory: Patient's speak in full sentences and does not appear short of breath  Cardiovascular: No lower extremity edema, non tender, no erythema  Gait MSK:  Back back exam does show some hypermobility noted.  Patient though does have tightness noted in the left shoulder blade.  Patient does have a slipped rib noted.  Scapular dyskinesis still noted.  Osteopathic findings  C2 flexed rotated and side bent right C6 flexed rotated and side bent left T3 extended rotated and side bent  left inhaled rib T8 extended rotated and side bent left inhaled rib L2 flexed rotated and side bent right Sacrum right on right     Assessment and Plan:  Scapular dyskinesis Continued scapular dyskinesis.  Patient continues to have some discomfort and pain.  Laxative previously but not taking them on a regular basis.  Is responding well though to osteopathic manipulation.  Follow-up again in 1 to 2 months    Nonallopathic problems  Decision today to treat with OMT was based on Physical Exam  After verbal consent patient was treated with HVLA, ME, FPR techniques in cervical, rib, thoracic, lumbar, and sacral  areas  Patient tolerated the procedure well with improvement in symptoms  Patient given exercises, stretches and lifestyle modifications  See medications in patient instructions if given  Patient will follow up in 4-8 weeks     The above documentation has been reviewed and is accurate and complete 09/08/2014, DO         Note: This dictation was prepared with Dragon dictation along with smaller phrase technology. Any transcriptional errors that result from this process are unintentional.

## 2022-03-09 ENCOUNTER — Ambulatory Visit (INDEPENDENT_AMBULATORY_CARE_PROVIDER_SITE_OTHER): Payer: BC Managed Care – PPO | Admitting: Family Medicine

## 2022-03-09 VITALS — BP 112/76 | HR 70 | Ht 59.0 in | Wt 138.0 lb

## 2022-03-09 DIAGNOSIS — M9908 Segmental and somatic dysfunction of rib cage: Secondary | ICD-10-CM | POA: Diagnosis not present

## 2022-03-09 DIAGNOSIS — M9904 Segmental and somatic dysfunction of sacral region: Secondary | ICD-10-CM | POA: Diagnosis not present

## 2022-03-09 DIAGNOSIS — M9903 Segmental and somatic dysfunction of lumbar region: Secondary | ICD-10-CM

## 2022-03-09 DIAGNOSIS — G2589 Other specified extrapyramidal and movement disorders: Secondary | ICD-10-CM

## 2022-03-09 DIAGNOSIS — M9901 Segmental and somatic dysfunction of cervical region: Secondary | ICD-10-CM | POA: Diagnosis not present

## 2022-03-09 DIAGNOSIS — M9902 Segmental and somatic dysfunction of thoracic region: Secondary | ICD-10-CM

## 2022-03-09 NOTE — Assessment & Plan Note (Signed)
Continued scapular dyskinesis.  Patient continues to have some discomfort and pain.  Laxative previously but not taking them on a regular basis.  Is responding well though to osteopathic manipulation.  Follow-up again in 1 to 2 months

## 2022-03-09 NOTE — Patient Instructions (Signed)
Good to see you Keep doing exercises See me in 2 months

## 2022-04-13 ENCOUNTER — Other Ambulatory Visit: Payer: Self-pay | Admitting: Family Medicine

## 2022-04-13 DIAGNOSIS — Z1231 Encounter for screening mammogram for malignant neoplasm of breast: Secondary | ICD-10-CM

## 2022-04-16 DIAGNOSIS — Z1231 Encounter for screening mammogram for malignant neoplasm of breast: Secondary | ICD-10-CM

## 2022-04-23 ENCOUNTER — Ambulatory Visit
Admission: RE | Admit: 2022-04-23 | Discharge: 2022-04-23 | Disposition: A | Discharge status: Home / Self Care | Payer: BC Managed Care – PPO | Source: Ambulatory Visit

## 2022-04-23 DIAGNOSIS — Z1231 Encounter for screening mammogram for malignant neoplasm of breast: Secondary | ICD-10-CM

## 2022-05-17 NOTE — Progress Notes (Deleted)
  Krista [name] [place] Phone: [phone] Subjective:    I'm seeing this patient by the request  of:  Krista Broker, MD  CC:   [id number]  Krista [name] is a 41 y.o. female coming in with complaint of back and neck pain. OMT 03/09/2022. Patient states   Medications patient has been prescribed: None  Taking:         Reviewed prior external information including notes and imaging from previsou exam, outside providers and external EMR if available.   As well as notes that were available from care everywhere and other healthcare systems.  Past medical history, social, surgical and family history all reviewed in electronic medical record.  No pertanent information unless stated regarding to the chief complaint.   Past Medical History:  Diagnosis Date   Asthma    exercise induced   DVT (deep venous thrombosis) (HCC)    Obesity     Allergies  Allergen Reactions   Prednisone Hives    Lips and tongue swell     Review of Systems:  No headache, visual changes, nausea, vomiting, diarrhea, constipation, dizziness, abdominal pain, skin rash, fevers, chills, night sweats, weight loss, swollen lymph nodes, body aches, joint swelling, chest pain, shortness of breath, mood changes. POSITIVE muscle aches  Objective  Last menstrual period 09/06/2014, unknown if currently breastfeeding.   General: No apparent distress alert and oriented x3 mood and affect normal, dressed appropriately.  HEENT: Pupils equal, extraocular movements intact  Respiratory: Patient's speak in full sentences and does not appear short of breath  Cardiovascular: No lower extremity edema, non tender, no erythema  Gait MSK:  Back   Osteopathic findings  C2 flexed rotated and side bent right C6 flexed rotated and side bent left T3 extended rotated and side bent right inhaled rib T9 extended rotated and side bent left L2 flexed rotated and  side bent right Sacrum right on right       Assessment and Plan:  No problem-specific Assessment & Plan notes found for this encounter.    Nonallopathic problems  Decision today to treat with OMT was based on Physical Exam  After verbal consent patient was treated with HVLA, ME, FPR techniques in cervical, rib, thoracic, lumbar, and sacral  areas  Patient tolerated the procedure well with improvement in symptoms  Patient given exercises, stretches and lifestyle modifications  See medications in patient instructions if given  Patient will follow up in 4-8 weeks             Note: This dictation was prepared with Dragon dictation along with smaller phrase technology. Any transcriptional errors that result from this process are unintentional.

## 2022-05-17 NOTE — Progress Notes (Unsigned)
[place] Phone: [phone] Subjective:   Fontaine No, am serving as a scribe for Dr. Hulan Saas.  I'm seeing this patient by the request  of:  Myrlene Broker, MD  CC: Back and neck pain follow-up  [id number]  Krista [name] is a 41 y.o. female coming in with complaint of back and neck pain. OMT on 03/09/2022. Patient states that she is having L scapular pain. Patient has good and bad days.   Medications patient has been prescribed: None  Taking:         Reviewed prior external information including notes and imaging from previsou exam, outside providers and external EMR if available.   As well as notes that were available from care everywhere and other healthcare systems.  Past medical history, social, surgical and family history all reviewed in electronic medical record.  No pertanent information unless stated regarding to the chief complaint.   Past Medical History:  Diagnosis Date   Asthma    exercise induced   DVT (deep venous thrombosis) (HCC)    Obesity     Allergies  Allergen Reactions   Prednisone Hives    Lips and tongue swell     Review of Systems:  No headache, visual changes, nausea, vomiting, diarrhea, constipation, dizziness, abdominal pain, skin rash, fevers, chills, night sweats, weight loss, swollen lymph nodes, body aches, joint swelling, chest pain, shortness of breath, mood changes. POSITIVE muscle aches  Objective  Blood pressure 102/72, pulse 67, height 4\' 11"  (1.499 m), weight 128 lb (58.1 kg), last menstrual period 09/06/2014, SpO2 98 %, unknown if currently breastfeeding.   General: No apparent distress alert and oriented x3 mood and affect normal, dressed appropriately.  HEENT: Pupils equal, extraocular movements intact  Respiratory: Patient's speak in full sentences and does not appear short of breath  Cardiovascular: No lower extremity edema, non  tender, no erythema  Patient has lost weight since we have seen her last.  Does have some tightness noted in the back noted.  Seems to be on the left side of the parascapular region.  Tightness on the left side of the paraspinal musculature of the neck as well.  Osteopathic findings  C5 flexed rotated and side bent right C6 flexed rotated and side bent left T3 extended rotated and side bent left inhaled rib T8 extended rotated and side bent left L2 flexed rotated and side bent right Sacrum right on right       Assessment and Plan:  Scapular dyskinesis Patient does have some scapular dyskinesis noted.  I discussed which activities to do surgery.  Discussed icing regimen and home exercises.  Patient did have more tightness noted.  Discussed patient's medications including the methocarbamol and the Zanaflex.  Patient has done well with weight loss over the course of time.  We will continue to work on the posture and ergonomics.  Follow-up again in 6 to 8 weeks.    Nonallopathic problems  Decision today to treat with OMT was based on Physical Exam  After verbal consent patient was treated with HVLA, ME, FPR techniques in cervical, rib, thoracic, lumbar, and sacral  areas  Patient tolerated the procedure well with improvement in symptoms  Patient given exercises, stretches and lifestyle modifications  See medications in patient instructions if given  Patient will follow up in 4-8 weeks     The above documentation has been reviewed and is accurate and complete Olevia Bowens  Tamala Julian, DO         Note: This dictation was prepared with Dragon dictation along with smaller phrase technology. Any transcriptional errors that result from this process are unintentional.

## 2022-05-18 ENCOUNTER — Ambulatory Visit (INDEPENDENT_AMBULATORY_CARE_PROVIDER_SITE_OTHER): Payer: BC Managed Care – PPO | Admitting: Family Medicine

## 2022-05-18 ENCOUNTER — Encounter: Payer: Self-pay | Admitting: Family Medicine

## 2022-05-18 ENCOUNTER — Ambulatory Visit: Payer: BC Managed Care – PPO | Admitting: Family Medicine

## 2022-05-18 VITALS — BP 102/72 | HR 67 | Ht 59.0 in | Wt 128.0 lb

## 2022-05-18 DIAGNOSIS — M9902 Segmental and somatic dysfunction of thoracic region: Secondary | ICD-10-CM | POA: Diagnosis not present

## 2022-05-18 DIAGNOSIS — G2589 Other specified extrapyramidal and movement disorders: Secondary | ICD-10-CM | POA: Diagnosis not present

## 2022-05-18 DIAGNOSIS — M9908 Segmental and somatic dysfunction of rib cage: Secondary | ICD-10-CM | POA: Diagnosis not present

## 2022-05-18 DIAGNOSIS — M9901 Segmental and somatic dysfunction of cervical region: Secondary | ICD-10-CM | POA: Diagnosis not present

## 2022-05-18 DIAGNOSIS — M9904 Segmental and somatic dysfunction of sacral region: Secondary | ICD-10-CM | POA: Diagnosis not present

## 2022-05-18 DIAGNOSIS — M9903 Segmental and somatic dysfunction of lumbar region: Secondary | ICD-10-CM

## 2022-05-18 NOTE — Patient Instructions (Signed)
Good to see you See me again in 6-8 weeks 

## 2022-05-18 NOTE — Assessment & Plan Note (Signed)
Patient does have some scapular dyskinesis noted.  I discussed which activities to do surgery.  Discussed icing regimen and home exercises.  Patient did have more tightness noted.  Discussed patient's medications including the methocarbamol and the Zanaflex.  Patient has done well with weight loss over the course of time.  We will continue to work on the posture and ergonomics.  Follow-up again in 6 to 8 weeks.

## 2022-05-19 ENCOUNTER — Ambulatory Visit: Payer: BC Managed Care – PPO | Admitting: Family Medicine

## 2022-05-24 ENCOUNTER — Encounter: Payer: Self-pay | Admitting: Family Medicine

## 2022-06-30 NOTE — Progress Notes (Signed)
[name] [address] [place] [address] Phone: [phone] Subjective:   Bruce Donath, am serving as a scribe for Dr. Antoine Primas.  I'm seeing this patient by the request  of:  Hadley Pen, MD  CC: Back and neck pain follow-up  [id number]  Krista [name] is a 41 y.o. female coming in with complaint of back and neck pain. OMT 05/18/2022. Patient states that she continues to have pain and tightness in between scapula. No worse than last visit.   Medications patient has been prescribed: None  Taking:         Reviewed prior external information including notes and imaging from previsou exam, outside providers and external EMR if available.   As well as notes that were available from care everywhere and other healthcare systems.  Past medical history, social, surgical and family history all reviewed in electronic medical record.  No pertanent information unless stated regarding to the chief complaint.   Past Medical History:  Diagnosis Date   Asthma    exercise induced   DVT (deep venous thrombosis) (HCC)    Obesity     Allergies  Allergen Reactions   Prednisone Hives    Lips and tongue swell     Review of Systems:  No headache, visual changes, nausea, vomiting, diarrhea, constipation, dizziness, abdominal pain, skin rash, fevers, chills, night sweats, weight loss, swollen lymph nodes, body aches, joint swelling, chest pain, shortness of breath, mood changes. POSITIVE muscle aches  Objective  Blood pressure 114/72, pulse 65, height 4\' 11"  (1.499 m), weight 123 lb (55.8 kg), last menstrual period 09/06/2014, SpO2 99 %, unknown if currently breastfeeding.   General: No apparent distress alert and oriented x3 mood and affect normal, dressed appropriately.  HEENT: Pupils equal, extraocular movements intact  Respiratory: Patient's speak in full sentences and does not appear short of breath  Cardiovascular: No lower  extremity edema, non tender, no erythema  Back exam does have some mild loss of lordosis.  Still tightness mostly in the parascapular region right greater than left.  Patient noted does have an elevated rib on the left side noted today.   Osteopathic findings  C2 flexed rotated and side bent right C6 flexed rotated and side bent left T2 extended rotated and side bent left  inhaled rib T9 extended rotated and side bent left L2 flexed rotated and side bent right Sacrum right on right       Assessment and Plan:  Scapular dyskinesis Patient continues to have some scapular dyskinesis.  Patient has done a remarkable job with the weight loss at the moment.  Discussed with patient about posture and ergonomics.  Discussed with patient at increasing activity slowly.  Follow-up with me again in 6 to 8 weeks for further evaluation and treatment.    Nonallopathic problems  Decision today to treat with OMT was based on Physical Exam  After verbal consent patient was treated with HVLA, ME, FPR techniques in cervical, rib, thoracic, lumbar, and sacral  areas  Patient tolerated the procedure well with improvement in symptoms  Patient given exercises, stretches and lifestyle modifications  See medications in patient instructions if given  Patient will follow up in 4-8 weeks    The above documentation has been reviewed and is accurate and complete 09/08/2014, DO          Note: This dictation was prepared with Dragon dictation along with smaller phrase technology. Any transcriptional errors that  result from this process are unintentional.

## 2022-07-07 ENCOUNTER — Ambulatory Visit (INDEPENDENT_AMBULATORY_CARE_PROVIDER_SITE_OTHER): Payer: BC Managed Care – PPO | Admitting: Family Medicine

## 2022-07-07 VITALS — BP 114/72 | HR 65 | Ht 59.0 in | Wt 123.0 lb

## 2022-07-07 DIAGNOSIS — M9908 Segmental and somatic dysfunction of rib cage: Secondary | ICD-10-CM | POA: Diagnosis not present

## 2022-07-07 DIAGNOSIS — G2589 Other specified extrapyramidal and movement disorders: Secondary | ICD-10-CM | POA: Diagnosis not present

## 2022-07-07 DIAGNOSIS — M9901 Segmental and somatic dysfunction of cervical region: Secondary | ICD-10-CM

## 2022-07-07 DIAGNOSIS — M9904 Segmental and somatic dysfunction of sacral region: Secondary | ICD-10-CM

## 2022-07-07 DIAGNOSIS — M9903 Segmental and somatic dysfunction of lumbar region: Secondary | ICD-10-CM

## 2022-07-07 DIAGNOSIS — M9902 Segmental and somatic dysfunction of thoracic region: Secondary | ICD-10-CM

## 2022-07-07 NOTE — Patient Instructions (Signed)
Great to see you Happy Holidays See me in 6-8 weeks

## 2022-07-07 NOTE — Assessment & Plan Note (Signed)
Patient continues to have some scapular dyskinesis.  Patient has done a remarkable job with the weight loss at the moment.  Discussed with patient about posture and ergonomics.  Discussed with patient at increasing activity slowly.  Follow-up with me again in 6 to 8 weeks for further evaluation and treatment.

## 2022-08-11 NOTE — Progress Notes (Deleted)
  [place] Phone: [phone] Subjective:    I'm seeing this patient by the request  of:  Myrlene Broker, MD  CC:   [id number]  Krista [name] is a 42 y.o. female coming in with complaint of back and neck pain. OMT 07/07/2022. Patient states   Medications patient has been prescribed: Zanaflex  Taking:         Reviewed prior external information including notes and imaging from previsou exam, outside providers and external EMR if available.   As well as notes that were available from care everywhere and other healthcare systems.  Past medical history, social, surgical and family history all reviewed in electronic medical record.  No pertanent information unless stated regarding to the chief complaint.   Past Medical History:  Diagnosis Date   Asthma    exercise induced   DVT (deep venous thrombosis) (HCC)    Obesity     Allergies  Allergen Reactions   Prednisone Hives    Lips and tongue swell     Review of Systems:  No headache, visual changes, nausea, vomiting, diarrhea, constipation, dizziness, abdominal pain, skin rash, fevers, chills, night sweats, weight loss, swollen lymph nodes, body aches, joint swelling, chest pain, shortness of breath, mood changes. POSITIVE muscle aches  Objective  Last menstrual period 09/06/2014, unknown if currently breastfeeding.   General: No apparent distress alert and oriented x3 mood and affect normal, dressed appropriately.  HEENT: Pupils equal, extraocular movements intact  Respiratory: Patient's speak in full sentences and does not appear short of breath  Cardiovascular: No lower extremity edema, non tender, no erythema  Gait MSK:  Back   Osteopathic findings  C2 flexed rotated and side bent right C6 flexed rotated and side bent left T3 extended rotated and side bent right inhaled rib T9 extended rotated and side bent left L2 flexed rotated  and side bent right Sacrum right on right       Assessment and Plan:  No problem-specific Assessment & Plan notes found for this encounter.    Nonallopathic problems  Decision today to treat with OMT was based on Physical Exam  After verbal consent patient was treated with HVLA, ME, FPR techniques in cervical, rib, thoracic, lumbar, and sacral  areas  Patient tolerated the procedure well with improvement in symptoms  Patient given exercises, stretches and lifestyle modifications  See medications in patient instructions if given  Patient will follow up in 4-8 weeks             Note: This dictation was prepared with Dragon dictation along with smaller phrase technology. Any transcriptional errors that result from this process are unintentional.

## 2022-08-18 ENCOUNTER — Ambulatory Visit: Payer: BC Managed Care – PPO | Admitting: Family Medicine

## 2022-09-17 DIAGNOSIS — N821 Other female urinary-genital tract fistulae: Secondary | ICD-10-CM | POA: Diagnosis not present

## 2022-09-17 NOTE — Progress Notes (Signed)
[place] Phone: [phone] Subjective:   Fontaine No, am serving as a scribe for Dr. Hulan Saas.  I'm seeing this patient by the request  of:  Myrlene Broker, MD  CC: Neck and back pain follow-up  [id number]  HEDAYA CHINERY is a 42 y.o. female coming in with complaint of back and neck pain. OMT 07/07/2022. Patient states that she has burning in between scapula. Worse at end of the day.  Discussed posture and ergonomics.  Discussed which activities she is complaining of experiencing  Pain at work  Medications patient has been prescribed: None  Taking:         Reviewed prior external information including notes and imaging from previsou exam, outside providers and external EMR if available.   As well as notes that were available from care everywhere and other healthcare systems.  Past medical history, social, surgical and family history all reviewed in electronic medical record.  No pertanent information unless stated regarding to the chief complaint.   Past Medical History:  Diagnosis Date   Asthma    exercise induced   DVT (deep venous thrombosis) (HCC)    Obesity     Allergies  Allergen Reactions   Prednisone Hives    Lips and tongue swell     Review of Systems:  No headache, visual changes, nausea, vomiting, diarrhea, constipation, dizziness, abdominal pain, skin rash, fevers, chills, night sweats, weight loss, swollen lymph nodes, body aches, joint swelling, chest pain, shortness of breath, mood changes. POSITIVE muscle aches  Objective  Blood pressure 104/72, pulse 65, height 4' 11"$  (1.499 m), weight 133 lb (60.3 kg), last menstrual period 09/06/2014, SpO2 99 %, unknown if currently breastfeeding.   General: No apparent distress alert and oriented x3 mood and affect normal, dressed appropriately.  HEENT: Pupils equal, extraocular movements intact  Respiratory: Patient's  speak in full sentences and does not appear short of breath  Cardiovascular: No lower extremity edema, non tender, no erythema  Cardiovascular.  Tightness noted with scapular dyskinesis left greater than right..  Osteopathic findings  C2 flexed rotated and side bent right C6 flexed rotated and side bent left T4 extended rotated and side bent left inhaled rib T8 extended rotated and side bent left inhaled with L2 flexed rotated and side bent right Sacrum right on right       Assessment and Plan:  Scapular dyskinesis Patient is making strides but continues to have tightness noted.  Discussed icing regimen and home exercises, discussed which activities to do anything significant.  Continues to have some tightness noted and is likely secondary to breast tissue.  Patient is looking to have a breast reduction surgery which will help with pain as well.  Follow-up with me again in 6 weeks time and again in 6 weeks after surgery for further evaluation and treatment    Nonallopathic problems  Decision today to treat with OMT was based on Physical Exam  After verbal consent patient was treated with HVLA, ME, FPR techniques in cervical, rib, thoracic, lumbar, and sacral  areas  Patient tolerated the procedure well with improvement in symptoms  Patient given exercises, stretches and lifestyle modifications  See medications in patient instructions if given  Patient will follow up in 4-8 weeks     The above documentation has been reviewed and is accurate and complete Lyndal Pulley, DO         Note: This dictation  was prepared with Dragon dictation along with smaller phrase technology. Any transcriptional errors that result from this process are unintentional.

## 2022-09-21 ENCOUNTER — Ambulatory Visit (INDEPENDENT_AMBULATORY_CARE_PROVIDER_SITE_OTHER): Payer: 59 | Admitting: Family Medicine

## 2022-09-21 ENCOUNTER — Encounter: Payer: Self-pay | Admitting: Family Medicine

## 2022-09-21 VITALS — BP 104/72 | HR 65 | Ht 59.0 in | Wt 122.0 lb

## 2022-09-21 DIAGNOSIS — M9902 Segmental and somatic dysfunction of thoracic region: Secondary | ICD-10-CM | POA: Diagnosis not present

## 2022-09-21 DIAGNOSIS — M9908 Segmental and somatic dysfunction of rib cage: Secondary | ICD-10-CM

## 2022-09-21 DIAGNOSIS — M9901 Segmental and somatic dysfunction of cervical region: Secondary | ICD-10-CM | POA: Diagnosis not present

## 2022-09-21 DIAGNOSIS — M9904 Segmental and somatic dysfunction of sacral region: Secondary | ICD-10-CM | POA: Diagnosis not present

## 2022-09-21 DIAGNOSIS — M9903 Segmental and somatic dysfunction of lumbar region: Secondary | ICD-10-CM

## 2022-09-21 DIAGNOSIS — G2589 Other specified extrapyramidal and movement disorders: Secondary | ICD-10-CM

## 2022-09-21 NOTE — Assessment & Plan Note (Signed)
Patient is making strides but continues to have tightness noted.  Discussed icing regimen and home exercises, discussed which activities to do anything significant.  Continues to have some tightness noted and is likely secondary to breast tissue.  Patient is looking to have a breast reduction surgery which will help with pain as well.  Follow-up with me again in 6 weeks time and again in 6 weeks after surgery for further evaluation and treatment

## 2022-09-21 NOTE — Patient Instructions (Signed)
Great to see you You have made such significant differences See me 5 weeks before surgery

## 2022-10-13 DIAGNOSIS — N62 Hypertrophy of breast: Secondary | ICD-10-CM | POA: Diagnosis not present

## 2022-10-13 DIAGNOSIS — G8929 Other chronic pain: Secondary | ICD-10-CM | POA: Diagnosis not present

## 2022-10-13 DIAGNOSIS — M549 Dorsalgia, unspecified: Secondary | ICD-10-CM | POA: Diagnosis not present

## 2022-10-13 DIAGNOSIS — M542 Cervicalgia: Secondary | ICD-10-CM | POA: Diagnosis not present

## 2022-10-13 NOTE — H&P (Signed)
  Subjective:    Patient ID: Krista [name] is a 42 y.o. female.   HPI   Returns for follow up discussion breast reduction. Current DDD. Reports several year neck and back pain with burning and pulling sensation. She has tried weight loss as below, OTC pain medication, specialty fitted bras, PT/back adjustments for over 6 month trial without relief. Reports recurrent rashes beneath breast that have persisted despite topical Nystatin use, hygiene measures for over 6 month trial.    Highest weight 243 lb. Underwent sleeve gastrectomy 06/2021. Lowest weight post operative current and stable at this for 6 months.    MMG 04/2022 normal. Denies FH breast or ovarian ca.   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 Lives with spouse and kids ages 86, 38,11,15,18.    Review of Systems  Endocrine: Positive for cold intolerance and heat intolerance.  Musculoskeletal: Positive for back pain.  Skin: Positive for rash.  Psychiatric/Behavioral: Positive for sleep disturbance.    Remainder 12 point review negative   Objective:  Physical Exam Cardiovascular:     Rate and Rhythm: Normal rate. Normal heart sounds Pulmonary:     Effort: Pulmonary effort is normal. Clear to auscultation Lymphadenopathy:     Upper Body:     Right upper body: No axillary adenopathy.     Left upper body: No axillary adenopathy.  Neurological:     Mental Status: She is oriented to person, place, and time.      +shoulder grooving Breasts: grade 3 ptosis bilateral, no masses SN to nipple R 29.5 L 29. 5 cm BW R 18 L 18 cm Nipple to IMF R 10 L 10 cm     Assessment:    Macromastia Chronic neck and back pain S/p sleeve gastrectomy   Plan:    Chronic neck and back pain, intertrigo that has failed conservative measures in setting of macromastia. No other cause of back pain or rashes noted. There is a reasonable likelihood that the symptoms are primarily due to macromastia; breast reduction surgery has reasonable  expectation to improve symptoms.   Reviewed reduction with anchor type scars, OP surgery, drains, post operative visits and limitations, recovery. Diminished sensation nipple and breast skin, risk of nipple loss, wound healing problems, asymmetry, incidental carcinoma, changes with wt gain/loss, aging, unacceptable cosmetic appearance reviewed. Cannot assure her cup size. Reviewed surgery will improve contour lateral chest wall soft tissue rolls but not completely flatten.   Additional risks including but not limited to bleeding, hematoma, seroma, damage to adjacent structures, blood clots in legs or lungs reviewed. Completed ASPS consent.   Drain teaching completed. Rx for   Anticipate 280 g reduction from each breas

## 2022-10-25 NOTE — Progress Notes (Unsigned)
[place] Phone: [phone] Subjective:   Krista [name], am serving as a scribe for Dr. Hulan Saas.  I'm seeing this patient by the request  of:  Myrlene Broker, MD  CC: neck and back pain   [id number]  Krista [name] is a 42 y.o. female coming in with complaint of back and neck pain. OMT 2/123/2024. Breast reduction surgery on 11/02/2022. Patient states that she is experieincg burning in R scapula Tries to stretch to allevaite her pain.   Medications patient has been prescribed: None  Taking:         Reviewed prior external information including notes and imaging from previsou exam, outside providers and external EMR if available.   As well as notes that were available from care everywhere and other healthcare systems.  Past medical history, social, surgical and family history all reviewed in electronic medical record.  [name] pertanent information unless stated regarding to the chief complaint.   Past Medical History:  Diagnosis Date   Asthma    exercise induced   DVT (deep venous thrombosis) (HCC)    Family history of adverse reaction to anesthesia    PONV   Obesity     Allergies  Allergen Reactions   Prednisone Hives    Lips and tongue swell     Review of Systems:  [name] headache, visual changes, nausea, vomiting, diarrhea, constipation, dizziness, abdominal pain, skin rash, fevers, chills, night sweats, weight loss, swollen lymph nodes, body aches, joint swelling, chest pain, shortness of breath, mood changes. POSITIVE muscle aches  Objective  Blood pressure 98/64, pulse 67, height 4\' 11"  (1.499 m), weight 117 lb (53.1 kg), last menstrual period 09/06/2014, SpO2 97 %, unknown if currently breastfeeding.   General: [name] apparent distress alert and oriented x3 mood and affect normal, dressed appropriately.  HEENT: Pupils equal, extraocular movements intact  Respiratory: Patient's speak  in full sentences and does not appear short of breath  Cardiovascular: [name] lower extremity edema, non tender, [name] erythema  Significant increase in tightness on the neck and the right side of the scapular area.  Patient does have large amount of breast tissue that is also contributing to some of the aches and pains are increasing.  Osteopathic findings  C2 flexed rotated and side bent right C5 flexed rotated and side bent left T3 extended rotated and side bent right inhaled rib T9 extended rotated and side bent left L2 flexed rotated and side bent right L3 flexed rotated left side bent left Sacrum right on right       Assessment and Plan:  Scapular dyskinesis Continues to have some difficulty.  Discussed posture and ergonomics, which activities to do and which ones to avoid, increase activity slowly over the course of next several weeks.  Follow-up with me again in 7 to 8 weeks.  Patient is having breast reduction surgery segued actually 6 weeks status post surgery.    Nonallopathic problems  Decision today to treat with OMT was based on Physical Exam  After verbal consent patient was treated with HVLA, ME, FPR techniques in cervical, rib, thoracic, lumbar, and sacral  areas  Patient tolerated the procedure well with improvement in symptoms  Patient given exercises, stretches and lifestyle modifications  See medications in patient instructions if given  Patient will follow up in 4-8 weeks    The above documentation has been reviewed and is accurate and complete Lyndal Pulley, DO  Note: This dictation was prepared with Dragon dictation along with smaller phrase technology. Any transcriptional errors that result from this process are unintentional.

## 2022-10-26 ENCOUNTER — Other Ambulatory Visit: Payer: Self-pay

## 2022-10-26 ENCOUNTER — Encounter (HOSPITAL_BASED_OUTPATIENT_CLINIC_OR_DEPARTMENT_OTHER): Payer: Self-pay | Admitting: Plastic Surgery

## 2022-10-27 ENCOUNTER — Ambulatory Visit (INDEPENDENT_AMBULATORY_CARE_PROVIDER_SITE_OTHER): Payer: 59 | Admitting: Family Medicine

## 2022-10-27 VITALS — BP 98/64 | HR 67 | Ht 59.0 in | Wt 117.0 lb

## 2022-10-27 DIAGNOSIS — M9908 Segmental and somatic dysfunction of rib cage: Secondary | ICD-10-CM | POA: Diagnosis not present

## 2022-10-27 DIAGNOSIS — M9903 Segmental and somatic dysfunction of lumbar region: Secondary | ICD-10-CM

## 2022-10-27 DIAGNOSIS — M9904 Segmental and somatic dysfunction of sacral region: Secondary | ICD-10-CM

## 2022-10-27 DIAGNOSIS — M9902 Segmental and somatic dysfunction of thoracic region: Secondary | ICD-10-CM | POA: Diagnosis not present

## 2022-10-27 DIAGNOSIS — M9901 Segmental and somatic dysfunction of cervical region: Secondary | ICD-10-CM | POA: Diagnosis not present

## 2022-10-27 DIAGNOSIS — G2589 Other specified extrapyramidal and movement disorders: Secondary | ICD-10-CM

## 2022-10-27 NOTE — Patient Instructions (Signed)
Thanks for challenge this morning See me in 7-8 weeks

## 2022-10-27 NOTE — Assessment & Plan Note (Signed)
Continues to have some difficulty.  Discussed posture and ergonomics, which activities to do and which ones to avoid, increase activity slowly over the course of next several weeks.  Follow-up with me again in 7 to 8 weeks.  Patient is having breast reduction surgery segued actually 6 weeks status post surgery.

## 2022-11-01 NOTE — Anesthesia Preprocedure Evaluation (Signed)
Anesthesia Evaluation  Patient identified by MRN, date of birth, ID band Patient awake    Reviewed: Allergy & Precautions, NPO status , Patient's Chart, lab work & pertinent test results  History of Anesthesia Complications (+) PONV and history of anesthetic complications  Airway Mallampati: II  TM Distance: >3 FB Neck ROM: Full    Dental no notable dental hx. (+) Teeth Intact, Dental Advisory Given   Pulmonary asthma    Pulmonary exam normal breath sounds clear to auscultation       Cardiovascular Normal cardiovascular exam Rhythm:Regular Rate:Normal     Neuro/Psych  negative psych ROS   GI/Hepatic Neg liver ROS,GERD  Medicated and Controlled,,  Endo/Other  negative endocrine ROS    Renal/GU negative Renal ROS     Musculoskeletal   Abdominal   Peds  Hematology   Anesthesia Other Findings All: prednisone  Reproductive/Obstetrics                             Anesthesia Physical Anesthesia Plan  ASA: 1  Anesthesia Plan: General   Post-op Pain Management: Toradol IV (intra-op)*, Tylenol PO (pre-op)* and Ketamine IV*   Induction: Intravenous  PONV Risk Score and Plan: 3 and Treatment may vary due to age or medical condition, Midazolam, Ondansetron and Scopolamine patch - Pre-op  Airway Management Planned: Oral ETT  Additional Equipment: None  Intra-op Plan:   Post-operative Plan: Extubation in OR  Informed Consent: I have reviewed the patients History and Physical, chart, labs and discussed the procedure including the risks, benefits and alternatives for the proposed anesthesia with the patient or authorized representative who has indicated his/her understanding and acceptance.     Dental advisory given  Plan Discussed with:   Anesthesia Plan Comments:        Anesthesia Quick Evaluation

## 2022-11-02 ENCOUNTER — Ambulatory Visit (HOSPITAL_BASED_OUTPATIENT_CLINIC_OR_DEPARTMENT_OTHER): Payer: 59 | Admitting: Anesthesiology

## 2022-11-02 ENCOUNTER — Encounter (HOSPITAL_BASED_OUTPATIENT_CLINIC_OR_DEPARTMENT_OTHER): Payer: Self-pay | Admitting: Plastic Surgery

## 2022-11-02 ENCOUNTER — Encounter (HOSPITAL_BASED_OUTPATIENT_CLINIC_OR_DEPARTMENT_OTHER)
Admission: RE | Disposition: A | Discharge status: Home / Self Care | Payer: Self-pay | Source: Home / Self Care | Attending: Plastic Surgery

## 2022-11-02 ENCOUNTER — Other Ambulatory Visit: Payer: Self-pay

## 2022-11-02 ENCOUNTER — Ambulatory Visit (HOSPITAL_BASED_OUTPATIENT_CLINIC_OR_DEPARTMENT_OTHER)
Admission: RE | Admit: 2022-11-02 | Discharge: 2022-11-02 | Disposition: A | Discharge status: Home / Self Care | Payer: 59 | Attending: Plastic Surgery | Admitting: Plastic Surgery

## 2022-11-02 DIAGNOSIS — Z6823 Body mass index (BMI) 23.0-23.9, adult: Secondary | ICD-10-CM | POA: Diagnosis not present

## 2022-11-02 DIAGNOSIS — Z86718 Personal history of other venous thrombosis and embolism: Secondary | ICD-10-CM | POA: Diagnosis not present

## 2022-11-02 DIAGNOSIS — J45909 Unspecified asthma, uncomplicated: Secondary | ICD-10-CM | POA: Diagnosis not present

## 2022-11-02 DIAGNOSIS — N62 Hypertrophy of breast: Secondary | ICD-10-CM

## 2022-11-02 DIAGNOSIS — G8929 Other chronic pain: Secondary | ICD-10-CM | POA: Diagnosis not present

## 2022-11-02 DIAGNOSIS — M542 Cervicalgia: Secondary | ICD-10-CM | POA: Diagnosis not present

## 2022-11-02 DIAGNOSIS — M549 Dorsalgia, unspecified: Secondary | ICD-10-CM | POA: Insufficient documentation

## 2022-11-02 DIAGNOSIS — Z79899 Other long term (current) drug therapy: Secondary | ICD-10-CM | POA: Insufficient documentation

## 2022-11-02 DIAGNOSIS — Z9884 Bariatric surgery status: Secondary | ICD-10-CM | POA: Insufficient documentation

## 2022-11-02 DIAGNOSIS — E669 Obesity, unspecified: Secondary | ICD-10-CM | POA: Insufficient documentation

## 2022-11-02 DIAGNOSIS — K219 Gastro-esophageal reflux disease without esophagitis: Secondary | ICD-10-CM | POA: Diagnosis not present

## 2022-11-02 HISTORY — DX: Other complications of anesthesia, initial encounter: T88.59XA

## 2022-11-02 HISTORY — DX: Family history of other specified conditions: Z84.89

## 2022-11-02 HISTORY — PX: BREAST REDUCTION SURGERY: SHX8

## 2022-11-02 HISTORY — DX: Other specified postprocedural states: R11.2

## 2022-11-02 HISTORY — DX: Other specified postprocedural states: Z98.890

## 2022-11-02 SURGERY — MAMMOPLASTY, REDUCTION
Anesthesia: General | Site: Breast | Laterality: Bilateral

## 2022-11-02 MED ORDER — ACETAMINOPHEN 500 MG PO TABS
1000.0000 mg | ORAL_TABLET | ORAL | Status: AC
  Administered 2022-11-02: 1000 mg via ORAL

## 2022-11-02 MED ORDER — CHLORHEXIDINE GLUCONATE CLOTH 2 % EX PADS: 6.0000 | MEDICATED_PAD | Freq: Once | CUTANEOUS | Status: DC

## 2022-11-02 MED ORDER — ONDANSETRON HCL 4 MG/2ML IJ SOLN: 4.0000 mg | Freq: Once | INTRAMUSCULAR | Status: DC | PRN

## 2022-11-02 MED ORDER — EPHEDRINE 5 MG/ML INJ
INTRAVENOUS | Status: AC
  Filled 2022-11-02: qty 5

## 2022-11-02 MED ORDER — ACETAMINOPHEN 500 MG PO TABS
ORAL_TABLET | ORAL | Status: AC
  Filled 2022-11-02: qty 2

## 2022-11-02 MED ORDER — LIDOCAINE HCL (CARDIAC) PF 100 MG/5ML IV SOSY
PREFILLED_SYRINGE | INTRAVENOUS | Status: DC | PRN
  Administered 2022-11-02: 80 mg via INTRAVENOUS

## 2022-11-02 MED ORDER — PROPOFOL 10 MG/ML IV BOLUS
INTRAVENOUS | Status: DC | PRN
  Administered 2022-11-02: 180 mg via INTRAVENOUS

## 2022-11-02 MED ORDER — OXYCODONE HCL 5 MG/5ML PO SOLN: 5.0000 mg | Freq: Once | ORAL | Status: DC | PRN

## 2022-11-02 MED ORDER — SCOPOLAMINE 1 MG/3DAYS TD PT72
MEDICATED_PATCH | TRANSDERMAL | Status: AC
  Filled 2022-11-02: qty 1

## 2022-11-02 MED ORDER — LIDOCAINE 2% (20 MG/ML) 5 ML SYRINGE
INTRAMUSCULAR | Status: AC
  Filled 2022-11-02: qty 5

## 2022-11-02 MED ORDER — HYDROMORPHONE HCL 1 MG/ML IJ SOLN
INTRAMUSCULAR | Status: DC | PRN
  Administered 2022-11-02: .5 mg via INTRAVENOUS

## 2022-11-02 MED ORDER — EPHEDRINE SULFATE (PRESSORS) 50 MG/ML IJ SOLN
INTRAMUSCULAR | Status: DC | PRN
  Administered 2022-11-02: 10 mg via INTRAVENOUS

## 2022-11-02 MED ORDER — SUGAMMADEX SODIUM 200 MG/2ML IV SOLN
INTRAVENOUS | Status: DC | PRN
  Administered 2022-11-02: 200 mg via INTRAVENOUS

## 2022-11-02 MED ORDER — LACTATED RINGERS IV SOLN: INTRAVENOUS | Status: DC

## 2022-11-02 MED ORDER — GABAPENTIN 300 MG PO CAPS
ORAL_CAPSULE | ORAL | Status: AC
  Filled 2022-11-02: qty 1

## 2022-11-02 MED ORDER — ACETAMINOPHEN 10 MG/ML IV SOLN: 1000.0000 mg | Freq: Once | INTRAVENOUS | Status: DC | PRN

## 2022-11-02 MED ORDER — OXYCODONE HCL 5 MG PO TABS: 5.0000 mg | ORAL_TABLET | Freq: Once | ORAL | Status: DC | PRN

## 2022-11-02 MED ORDER — GABAPENTIN 300 MG PO CAPS
300.0000 mg | ORAL_CAPSULE | ORAL | Status: AC
  Administered 2022-11-02: 300 mg via ORAL

## 2022-11-02 MED ORDER — ROCURONIUM BROMIDE 100 MG/10ML IV SOLN
INTRAVENOUS | Status: DC | PRN
  Administered 2022-11-02: 50 mg via INTRAVENOUS

## 2022-11-02 MED ORDER — SCOPOLAMINE 1 MG/3DAYS TD PT72
1.0000 | MEDICATED_PATCH | TRANSDERMAL | Status: DC
  Administered 2022-11-02: 1.5 mg via TRANSDERMAL

## 2022-11-02 MED ORDER — ONDANSETRON HCL 4 MG/2ML IJ SOLN
INTRAMUSCULAR | Status: DC | PRN
  Administered 2022-11-02: 4 mg via INTRAVENOUS

## 2022-11-02 MED ORDER — CEFAZOLIN SODIUM-DEXTROSE 2-4 GM/100ML-% IV SOLN
INTRAVENOUS | Status: AC
  Filled 2022-11-02: qty 100

## 2022-11-02 MED ORDER — FENTANYL CITRATE (PF) 100 MCG/2ML IJ SOLN
INTRAMUSCULAR | Status: AC
  Filled 2022-11-02: qty 2

## 2022-11-02 MED ORDER — ATROPINE SULFATE 0.4 MG/ML IV SOLN
INTRAVENOUS | Status: AC
  Filled 2022-11-02: qty 1

## 2022-11-02 MED ORDER — PHENYLEPHRINE HCL (PRESSORS) 10 MG/ML IV SOLN
INTRAVENOUS | Status: DC | PRN
  Administered 2022-11-02: 160 ug via INTRAVENOUS
  Administered 2022-11-02: 80 ug via INTRAVENOUS

## 2022-11-02 MED ORDER — CEFAZOLIN SODIUM-DEXTROSE 2-4 GM/100ML-% IV SOLN
2.0000 g | INTRAVENOUS | Status: AC
  Administered 2022-11-02: 2 g via INTRAVENOUS

## 2022-11-02 MED ORDER — HYDROMORPHONE HCL 1 MG/ML IJ SOLN: 0.2500 mg | INTRAMUSCULAR | Status: DC | PRN

## 2022-11-02 MED ORDER — BUPIVACAINE HCL (PF) 0.5 % IJ SOLN
INTRAMUSCULAR | Status: DC | PRN
  Administered 2022-11-02: 30 mL

## 2022-11-02 MED ORDER — AMISULPRIDE (ANTIEMETIC) 5 MG/2ML IV SOLN: 10.0000 mg | Freq: Once | INTRAVENOUS | Status: DC | PRN

## 2022-11-02 MED ORDER — PHENYLEPHRINE 80 MCG/ML (10ML) SYRINGE FOR IV PUSH (FOR BLOOD PRESSURE SUPPORT)
PREFILLED_SYRINGE | INTRAVENOUS | Status: AC
  Filled 2022-11-02: qty 10

## 2022-11-02 MED ORDER — SUCCINYLCHOLINE CHLORIDE 200 MG/10ML IV SOSY
PREFILLED_SYRINGE | INTRAVENOUS | Status: AC
  Filled 2022-11-02: qty 10

## 2022-11-02 MED ORDER — KETAMINE HCL 50 MG/5ML IJ SOSY
PREFILLED_SYRINGE | INTRAMUSCULAR | Status: AC
  Filled 2022-11-02: qty 5

## 2022-11-02 MED ORDER — ONDANSETRON HCL 4 MG/2ML IJ SOLN
INTRAMUSCULAR | Status: AC
  Filled 2022-11-02: qty 2

## 2022-11-02 MED ORDER — MIDAZOLAM HCL 2 MG/2ML IJ SOLN
INTRAMUSCULAR | Status: AC
  Filled 2022-11-02: qty 2

## 2022-11-02 MED ORDER — MIDAZOLAM HCL 5 MG/5ML IJ SOLN
INTRAMUSCULAR | Status: DC | PRN
  Administered 2022-11-02: 2 mg via INTRAVENOUS

## 2022-11-02 MED ORDER — 0.9 % SODIUM CHLORIDE (POUR BTL) OPTIME
TOPICAL | Status: DC | PRN
  Administered 2022-11-02: 120 mL

## 2022-11-02 MED ORDER — ROCURONIUM BROMIDE 10 MG/ML (PF) SYRINGE
PREFILLED_SYRINGE | INTRAVENOUS | Status: AC
  Filled 2022-11-02: qty 10

## 2022-11-02 MED ORDER — KETAMINE HCL 10 MG/ML IJ SOLN
INTRAMUSCULAR | Status: DC | PRN
  Administered 2022-11-02: 25 mg via INTRAVENOUS

## 2022-11-02 MED ORDER — HYDROMORPHONE HCL 1 MG/ML IJ SOLN
INTRAMUSCULAR | Status: AC
  Filled 2022-11-02: qty 1

## 2022-11-02 MED ORDER — FENTANYL CITRATE (PF) 100 MCG/2ML IJ SOLN
INTRAMUSCULAR | Status: DC | PRN
  Administered 2022-11-02 (×3): 50 ug via INTRAVENOUS

## 2022-11-02 SURGICAL SUPPLY — 51 items
ADH SKN CLS APL DERMABOND .7 (GAUZE/BANDAGES/DRESSINGS) ×2
APL PRP STRL LF DISP 70% ISPRP (MISCELLANEOUS) ×2
BINDER BREAST 3XL (GAUZE/BANDAGES/DRESSINGS) IMPLANT
BINDER BREAST LRG (GAUZE/BANDAGES/DRESSINGS) IMPLANT
BINDER BREAST MEDIUM (GAUZE/BANDAGES/DRESSINGS) IMPLANT
BINDER BREAST XLRG (GAUZE/BANDAGES/DRESSINGS) IMPLANT
BINDER BREAST XXLRG (GAUZE/BANDAGES/DRESSINGS) IMPLANT
BLADE SURG 10 STRL SS (BLADE) ×8 IMPLANT
BNDG GAUZE DERMACEA FLUFF 4 (GAUZE/BANDAGES/DRESSINGS) ×4 IMPLANT
BNDG GZE DERMACEA 4 6PLY (GAUZE/BANDAGES/DRESSINGS) ×2
CANISTER SUCT 1200ML W/VALVE (MISCELLANEOUS) ×2 IMPLANT
CHLORAPREP W/TINT 26 (MISCELLANEOUS) ×4 IMPLANT
COVER BACK TABLE 60X90IN (DRAPES) ×2 IMPLANT
COVER MAYO STAND STRL (DRAPES) ×2 IMPLANT
DERMABOND ADVANCED .7 DNX12 (GAUZE/BANDAGES/DRESSINGS) ×4 IMPLANT
DRAIN CHANNEL 15F RND FF W/TCR (WOUND CARE) IMPLANT
DRAIN CHANNEL 19F RND (DRAIN) IMPLANT
DRAPE TOP ARMCOVERS (MISCELLANEOUS) ×2 IMPLANT
DRAPE U-SHAPE 76X120 STRL (DRAPES) ×2 IMPLANT
DRAPE UTILITY XL STRL (DRAPES) ×2 IMPLANT
ELECT COATED BLADE 2.86 ST (ELECTRODE) ×2 IMPLANT
ELECT REM PT RETURN 9FT ADLT (ELECTROSURGICAL) ×1
ELECTRODE REM PT RTRN 9FT ADLT (ELECTROSURGICAL) ×2 IMPLANT
EVACUATOR SILICONE 100CC (DRAIN) IMPLANT
GAUZE PAD ABD 8X10 STRL (GAUZE/BANDAGES/DRESSINGS) ×4 IMPLANT
GLOVE BIO SURGEON STRL SZ 6 (GLOVE) ×4 IMPLANT
GOWN STRL REUS W/ TWL LRG LVL3 (GOWN DISPOSABLE) ×4 IMPLANT
GOWN STRL REUS W/TWL LRG LVL3 (GOWN DISPOSABLE) ×2
MARKER SKIN DUAL TIP RULER LAB (MISCELLANEOUS) IMPLANT
NDL HYPO 25X1 1.5 SAFETY (NEEDLE) ×2 IMPLANT
NEEDLE HYPO 25X1 1.5 SAFETY (NEEDLE) ×1 IMPLANT
NS IRRIG 1000ML POUR BTL (IV SOLUTION) ×2 IMPLANT
PACK BASIN DAY SURGERY FS (CUSTOM PROCEDURE TRAY) ×2 IMPLANT
PENCIL SMOKE EVACUATOR (MISCELLANEOUS) ×2 IMPLANT
PIN SAFETY STERILE (MISCELLANEOUS) ×2 IMPLANT
SHEET MEDIUM DRAPE 40X70 STRL (DRAPES) ×4 IMPLANT
SLEEVE SCD COMPRESS KNEE MED (STOCKING) ×2 IMPLANT
SPONGE T-LAP 18X18 ~~LOC~~+RFID (SPONGE) ×6 IMPLANT
STAPLER VISISTAT 35W (STAPLE) ×2 IMPLANT
SUT ETHILON 2 0 FS 18 (SUTURE) IMPLANT
SUT MNCRL AB 4-0 PS2 18 (SUTURE) IMPLANT
SUT PDS AB 2-0 CT2 27 (SUTURE) IMPLANT
SUT VIC AB 3-0 PS1 18 (SUTURE) ×6
SUT VIC AB 3-0 PS1 18XBRD (SUTURE) IMPLANT
SUT VICRYL 4-0 PS2 18IN ABS (SUTURE) IMPLANT
SYR BULB IRRIG 60ML STRL (SYRINGE) ×2 IMPLANT
SYR CONTROL 10ML LL (SYRINGE) ×2 IMPLANT
TOWEL GREEN STERILE FF (TOWEL DISPOSABLE) ×4 IMPLANT
TUBE CONNECTING 20X1/4 (TUBING) ×2 IMPLANT
UNDERPAD 30X36 HEAVY ABSORB (UNDERPADS AND DIAPERS) ×4 IMPLANT
YANKAUER SUCT BULB TIP NO VENT (SUCTIONS) ×2 IMPLANT

## 2022-11-02 NOTE — Interval H&P Note (Signed)
History and Physical Interval Note:  11/02/2022 10:41 AM  Krista [name]  has presented today for surgery, with the diagnosis of macromastia chronic neck and back pain intertrigo.  The various methods of treatment have been discussed with the patient and family. After consideration of risks, benefits and other options for treatment, the patient has consented to  Procedure(s): MAMMARY REDUCTION  (BREAST) (Bilateral) as a surgical intervention.  The patient's history has been reviewed, patient examined, no change in status, stable for surgery.  I have reviewed the patient's chart and labs.  Questions were answered to the patient's satisfaction.     Arnoldo Hooker Lounell Schumacher

## 2022-11-02 NOTE — Anesthesia Postprocedure Evaluation (Signed)
Anesthesia Post Note  Patient: Krista [name]  Procedure(s) Performed: MAMMARY REDUCTION  (BREAST) (Bilateral: Breast)     Patient location during evaluation: PACU Anesthesia Type: General Level of consciousness: awake and alert Pain management: pain level controlled Vital Signs Assessment: post-procedure vital signs reviewed and stable Respiratory status: spontaneous breathing, nonlabored ventilation, respiratory function stable and patient connected to nasal cannula oxygen Cardiovascular status: blood pressure returned to baseline and stable Postop Assessment: no apparent nausea or vomiting Anesthetic complications: no  No notable events documented.  Last Vitals:  Vitals:   11/02/22 1530 11/02/22 1541  BP: 111/60 112/83  Pulse: (!) 104 89  Resp: 13 16  Temp:  36.8 C  SpO2: 100% 98%    Last Pain:  Vitals:   11/02/22 1541  TempSrc: Temporal  PainSc: [place]

## 2022-11-02 NOTE — Discharge Instructions (Addendum)
  Post Anesthesia Home Care Instructions  Activity: Get plenty of rest for the remainder of the day. A responsible individual must stay with you for 24 hours following the procedure.  For the next 24 hours, DO NOT: -Drive a car -Paediatric nurse -Drink alcoholic beverages -Take any medication unless instructed by your physician -Make any legal decisions or sign important papers.  Meals: Start with liquid foods such as gelatin or soup. Progress to regular foods as tolerated. Avoid greasy, spicy, heavy foods. If nausea and/or vomiting occur, drink only clear liquids until the nausea and/or vomiting subsides. Call your physician if vomiting continues.  Special Instructions/Symptoms: Your throat may feel dry or sore from the anesthesia or the breathing tube placed in your throat during surgery. If this causes discomfort, gargle with warm salt water. The discomfort should disappear within 24 hours.  If you had a scopolamine patch placed behind your ear for the management of post- operative nausea and/or vomiting:  1. The medication in the patch is effective for 72 hours, after which it should be removed.  Wrap patch in a tissue and discard in the trash. Wash hands thoroughly with soap and water. 2. You may remove the patch earlier than 72 hours if you experience unpleasant side effects which may include dry mouth, dizziness or visual disturbances. 3. Avoid touching the patch. Wash your hands with soap and water after contact with the patch.    Tylenol can be taken after 3:37 pm if needed

## 2022-11-02 NOTE — Op Note (Signed)
Operative Note   DATE OF OPERATION: 3.26.24  LOCATION: [facility]-outpatient  SURGICAL DIVISION: Plastic Surgery  PREOPERATIVE DIAGNOSES:  1. Macromastia 2. Chronic neck and back pain  POSTOPERATIVE DIAGNOSES:  same  PROCEDURE:  Bilateral breast reduction  SURGEON: Irene Limbo MD MBA  ASSISTANT: none  ANESTHESIA:  General.   EBL: 40 ml  COMPLICATIONS: None immediate.   INDICATIONS FOR PROCEDURE:  The patient, Krista [name], is a 42 y.o. female born on Jan 29, 1981, is here for treatment chronicneck and back pain in setting macromastia that has failed conservative measures.   FINDINGS: Right reduction 58 g Left reduction 45 g  DESCRIPTION OF PROCEDURE:  The patient was marked standing in the preoperative area to mark sternal notch, chest midline, anterior axillary lines, inframammary folds. The location of new nipple areolar complex was marked at level of on inframammary fold on anterior surface breast by palpation. This was marked symmetric over bilateral breasts. With aid of Wise pattern marker, location of new nipple areolar complex and vertical limbs (6 cm) were marked by displacement of breasts along meridian. The patient was taken to the operating room. SCDs were placed and IV antibiotics were given. The patient's operative site was prepped and draped in a sterile fashion. A time out was performed and all information was confirmed to be correct.     Over left breast, superior medial pedicle marked and nipple areolar complex incised with 42 mm diameter. Pedicle deepithlialized and developed to chest wall. Soft tissue resected over lower pole medial and lateral extent Wise pattern. An inferiorly based dermoglandular pedicle preserved for use as auto augmentation.This was advanced superiorly and secured to chest wall with interrupted 2-0 PDS suture. Medial and lateral flaps developed. Breast tailor tacked closed.    I then directed attention to right breast superior  medial pedicle marked and nipple areolar complex incised with 42 mm diameter. Pedicle deepithlialized and developed to chest wall. Soft tissue resected over lower pole medial and lateral extent Wise pattern.Inferiorly based dermoglandular pedicle preserved for use as auto augmentation. This was advanced superiorly and secured to chest wall with interrupted 2-0 PDS suture. Medial and lateral flaps developed. Breast tailor tacked closed, and patient assessed for symmetry. Breast cavities irrigated and hemostasis obtained. Local anesthetic infiltrated throughout each breast. 15 Fr JP placed in each breast and secured with 2-0 nylon. Closure completed bilateral with 3-0 vicryl to approximate dermis along inframammary fold and vertical limb. NAC inset with 3-0 vicryl in dermis. Skin closure completed with 4-0 monocryl subcuticular throughout.Tissue adhesive applied.    Dry dressing and breast binder applied.The patient was allowed to wake from anesthesia, extubated and taken to the recovery room in satisfactory condition.   SPECIMENS: right and left breast reduction  DRAINS: 15 Fr JP in right and left breast  Irene Limbo, MD [facility]  Office/ physician access line after hours [phone]

## 2022-11-02 NOTE — Transfer of Care (Signed)
Immediate Anesthesia Transfer of Care Note  Patient: Krista [name]  Procedure(s) Performed: MAMMARY REDUCTION  (BREAST) (Bilateral: Breast)  Patient Location: PACU  Anesthesia Type:General  Level of Consciousness: awake, alert , oriented, drowsy, and patient cooperative  Airway & Oxygen Therapy: Patient Spontanous Breathing and Patient connected to face mask oxygen  Post-op Assessment: Report given to RN and Post -op Vital signs reviewed and stable  Post vital signs: Reviewed and stable  Last Vitals:  Vitals Value Taken Time  BP    Temp 36.8 C 11/02/22 1446  Pulse 102 11/02/22 1446  Resp 16 11/02/22 1446  SpO2 100 % 11/02/22 1446    Last Pain:  Vitals:   11/02/22 0934  TempSrc: Oral  PainSc: 0-No pain         Complications: No notable events documented.

## 2022-11-02 NOTE — Anesthesia Procedure Notes (Signed)
Procedure Name: Intubation Date/Time: 11/02/2022 11:29 AM  Performed by: Willa Frater, CRNAPre-anesthesia Checklist: Patient identified, Emergency Drugs available, Suction available and Patient being monitored Patient Re-evaluated:Patient Re-evaluated prior to induction Oxygen Delivery Method: Circle system utilized Preoxygenation: Pre-oxygenation with 100% oxygen Induction Type: IV induction Ventilation: Mask ventilation without difficulty Laryngoscope Size: Mac and 3 Grade View: Grade I Tube type: Oral Tube size: 7.0 mm Number of attempts: 1 Airway Equipment and Method: Stylet and Oral airway Placement Confirmation: ETT inserted through vocal cords under direct vision, positive ETCO2 and breath sounds checked- equal and bilateral Secured at: 19 cm Tube secured with: Tape Dental Injury: Teeth and Oropharynx as per pre-operative assessment

## 2022-11-03 ENCOUNTER — Encounter (HOSPITAL_BASED_OUTPATIENT_CLINIC_OR_DEPARTMENT_OTHER): Payer: Self-pay | Admitting: Plastic Surgery

## 2022-11-04 LAB — SURGICAL PATHOLOGY

## 2022-11-10 DIAGNOSIS — Z9889 Other specified postprocedural states: Secondary | ICD-10-CM | POA: Diagnosis not present

## 2022-11-11 ENCOUNTER — Other Ambulatory Visit: Payer: Self-pay

## 2022-11-11 ENCOUNTER — Encounter (HOSPITAL_BASED_OUTPATIENT_CLINIC_OR_DEPARTMENT_OTHER): Payer: Self-pay | Admitting: *Deleted

## 2022-11-11 ENCOUNTER — Emergency Department (HOSPITAL_BASED_OUTPATIENT_CLINIC_OR_DEPARTMENT_OTHER)
Admission: EM | Admit: 2022-11-11 | Discharge: 2022-11-11 | Disposition: A | Discharge status: Home / Self Care | Payer: 59 | Source: Home / Self Care | Attending: Emergency Medicine | Admitting: Emergency Medicine

## 2022-11-11 DIAGNOSIS — R809 Proteinuria, unspecified: Secondary | ICD-10-CM | POA: Diagnosis not present

## 2022-11-11 DIAGNOSIS — J45909 Unspecified asthma, uncomplicated: Secondary | ICD-10-CM | POA: Insufficient documentation

## 2022-11-11 DIAGNOSIS — T886XXA Anaphylactic reaction due to adverse effect of correct drug or medicament properly administered, initial encounter: Secondary | ICD-10-CM | POA: Diagnosis not present

## 2022-11-11 DIAGNOSIS — T7840XA Allergy, unspecified, initial encounter: Secondary | ICD-10-CM | POA: Insufficient documentation

## 2022-11-11 DIAGNOSIS — I951 Orthostatic hypotension: Secondary | ICD-10-CM | POA: Diagnosis not present

## 2022-11-11 DIAGNOSIS — R000 Tachycardia, unspecified: Secondary | ICD-10-CM | POA: Diagnosis not present

## 2022-11-11 DIAGNOSIS — L509 Urticaria, unspecified: Secondary | ICD-10-CM | POA: Diagnosis not present

## 2022-11-11 DIAGNOSIS — I959 Hypotension, unspecified: Secondary | ICD-10-CM | POA: Diagnosis not present

## 2022-11-11 DIAGNOSIS — T782XXA Anaphylactic shock, unspecified, initial encounter: Secondary | ICD-10-CM | POA: Insufficient documentation

## 2022-11-11 DIAGNOSIS — T50905A Adverse effect of unspecified drugs, medicaments and biological substances, initial encounter: Secondary | ICD-10-CM | POA: Diagnosis not present

## 2022-11-11 MED ORDER — DIPHENHYDRAMINE HCL 50 MG/ML IJ SOLN
50.0000 mg | Freq: Once | INTRAMUSCULAR | Status: AC
  Administered 2022-11-11: 50 mg via INTRAVENOUS
  Filled 2022-11-11: qty 1

## 2022-11-11 MED ORDER — DIPHENHYDRAMINE HCL 50 MG/ML IJ SOLN
25.0000 mg | Freq: Once | INTRAMUSCULAR | Status: AC
  Administered 2022-11-11: 25 mg via INTRAVENOUS
  Filled 2022-11-11: qty 1

## 2022-11-11 MED ORDER — SODIUM CHLORIDE 0.9 % IV SOLN: INTRAVENOUS | Status: DC

## 2022-11-11 MED ORDER — EPINEPHRINE 0.3 MG/0.3ML IJ SOAJ
0.3000 mg | Freq: Once | INTRAMUSCULAR | Status: AC
  Administered 2022-11-11: 0.3 mg via INTRAMUSCULAR
  Filled 2022-11-11: qty 0.3

## 2022-11-11 MED ORDER — SODIUM CHLORIDE 0.9 % IV BOLUS
1000.0000 mL | Freq: Once | INTRAVENOUS | Status: AC
  Administered 2022-11-11: 1000 mL via INTRAVENOUS

## 2022-11-11 MED ORDER — EPINEPHRINE 0.3 MG/0.3ML IJ SOAJ: 0.3000 mg | INTRAMUSCULAR | 0 refills | Status: DC | PRN

## 2022-11-11 MED ORDER — FAMOTIDINE IN NACL 20-0.9 MG/50ML-% IV SOLN
20.0000 mg | Freq: Once | INTRAVENOUS | Status: AC
  Administered 2022-11-11: 20 mg via INTRAVENOUS
  Filled 2022-11-11: qty 50

## 2022-11-11 NOTE — ED Triage Notes (Signed)
Pt began having an allergic reaction yesterday.  She states that she began having itching and large hives yesterday and her MD called her in a dose pack of steroids yesterday which she began taking.  Her allergic symptoms have gotten worse per tp and she was seen at [facility] where they diagnosed her with urticaria, anaphylactic shock, hypotension ((87/63 at [facility]), proteinuria, orthostatic hypotension.  Pt was advised to come to ED.  VSS in triage

## 2022-11-11 NOTE — Discharge Instructions (Signed)
Your history, exam, evaluation are consistent with anaphylactic reaction causing the rash, lightheadedness, and soft blood pressures.  These symptoms improved after fluids and EpiPen today.  We agreed together to hold on steroids as you already received a steroid shot and I suspect the steroids may have contributed given your history of steroid allergy.  You may use over-the-counter Pepcid and Benadryl to help with itching.  Please follow-up with your primary doctor and follow-up with them for further allergy testing.  If any symptoms change or worsen acutely, please turn to the nearest emergency department.

## 2022-11-11 NOTE — ED Notes (Signed)
Noted hives on pt. Body / red and raised her arms...chest ...stomach. Face...forehead...hands..legs Right and left.

## 2022-11-11 NOTE — ED Provider Notes (Signed)
[place] [facility] Provider Note   CSN: [id number] Arrival date & time: 11/11/22  1458     History  Chief Complaint  Patient presents with   Allergic Reaction    Krista [name] is a 42 y.o. female.  The history is provided by the patient and medical records. No language interpreter was used.  Allergic Reaction Presenting symptoms: itching, rash and swelling   Presenting symptoms: no wheezing   Presenting symptoms comment:  Near syncope Severity:  Moderate Prior allergic episodes:  No prior episodes Context: medications   Relieved by:  Nothing Worsened by:  Nothing Ineffective treatments:  Antihistamines and steroids      Home Medications Prior to Admission medications   Medication Sig Start Date End Date Taking? Authorizing Provider  ALBUTEROL IN Inhale into the lungs.    [provider]  buPROPion (WELLBUTRIN XL) 150 MG 24 hr tablet Take 150 mg by mouth every morning. 12/04/19   [provider]  estradiol (VIVELLE-DOT) 0.05 MG/24HR patch Change patch twice weekly 09/19/19   [provider]  omeprazole (PRILOSEC OTC) 20 MG tablet Take 20 mg by mouth daily.    [provider]      Allergies    Prednisone    Review of Systems   Review of Systems  Constitutional:  Negative for chills, fatigue and fever.  HENT:  Negative for congestion.   Eyes:  Negative for visual disturbance.  Respiratory:  Negative for cough, chest tightness, shortness of breath and wheezing.   Cardiovascular:  Negative for chest pain and palpitations.  Gastrointestinal:  Negative for abdominal pain, constipation, diarrhea, nausea and vomiting.  Musculoskeletal:  Negative for back pain, neck pain and neck stiffness.  Skin:  Positive for itching and rash.  Neurological:  Positive for light-headedness. Negative for dizziness, weakness and headaches.  Psychiatric/Behavioral:  Negative for agitation and confusion.   All other  systems reviewed and are negative.   Physical Exam Updated Vital Signs BP 104/75 (BP Location: Right Arm)   Pulse (!) 123   Temp 98.4 F (36.9 C) (Oral)   Resp 18   Wt 53.1 kg   LMP 09/06/2014   SpO2 100%   BMI 23.63 kg/m  Physical Exam Vitals and nursing note reviewed.  Constitutional:      General: She is not in acute distress.    Appearance: She is well-developed. She is not ill-appearing, toxic-appearing or diaphoretic.  HENT:     Head: Normocephalic and atraumatic.     Nose: No congestion or rhinorrhea.     Mouth/Throat:     Mouth: Mucous membranes are moist.     Pharynx: No oropharyngeal exudate or posterior oropharyngeal erythema.  Eyes:     Extraocular Movements: Extraocular movements intact.     Conjunctiva/sclera: Conjunctivae normal.     Pupils: Pupils are equal, round, and reactive to light.  Cardiovascular:     Rate and Rhythm: Regular rhythm. Tachycardia present.     Pulses: Normal pulses.     Heart sounds: No murmur heard. Pulmonary:     Effort: Pulmonary effort is normal. No respiratory distress.     Breath sounds: Normal breath sounds. No wheezing, rhonchi or rales.  Chest:     Chest wall: No tenderness.  Abdominal:     General: Abdomen is flat.     Palpations: Abdomen is soft.     Tenderness: There is no abdominal tenderness. There is no right CVA tenderness, left CVA tenderness, guarding  or rebound.  Musculoskeletal:        General: No swelling or tenderness.     Cervical back: Neck supple. No tenderness.     Left lower leg: No edema.  Skin:    General: Skin is warm and dry.     Capillary Refill: Capillary refill takes less than 2 seconds.     Findings: Erythema and rash present.  Neurological:     General: No focal deficit present.     Mental Status: She is alert.     Sensory: No sensory deficit.     Motor: No weakness.  Psychiatric:        Mood and Affect: Mood normal.    ED Results / Procedures / Treatments   Labs (all labs ordered  are listed, but only abnormal results are displayed) Labs Reviewed - No data to display  EKG None  Radiology No results found.  Procedures Procedures    CRITICAL CARE Performed by: Gwenyth Allegra Leaira Fullam Total critical care time: 35 minutes Critical care time was exclusive of separately billable procedures and treating other patients. Critical care was necessary to treat or prevent imminent or life-threatening deterioration. Critical care was time spent personally by me on the following activities: development of treatment plan with patient and/or surrogate as well as nursing, discussions with consultants, evaluation of patient's response to treatment, examination of patient, obtaining history from patient or surrogate, ordering and performing treatments and interventions, ordering and review of laboratory studies, ordering and review of radiographic studies, pulse oximetry and re-evaluation of patient's condition.  Medications Ordered in ED Medications  sodium chloride 0.9 % bolus 1,000 mL (0 mLs Intravenous Stopped 11/11/22 1705)    And  0.9 %  sodium chloride infusion (0 mLs Intravenous Stopped 11/11/22 2255)  diphenhydrAMINE (BENADRYL) injection 50 mg (50 mg Intravenous Given 11/11/22 1550)  EPINEPHrine (EPI-PEN) injection 0.3 mg (0.3 mg Intramuscular Given 11/11/22 1542)  famotidine (PEPCID) IVPB 20 mg premix (0 mg Intravenous Stopped 11/11/22 1643)  diphenhydrAMINE (BENADRYL) injection 25 mg (25 mg Intravenous Given 11/11/22 2001)    ED Course/ Medical Decision Making/ A&P                             Medical Decision Making Risk Prescription drug management.    Krista [name] is a 42 y.o. female with a past medical history significant for previous DVT, asthma, previous hysterectomy, cholecystectomy, and breast reduction surgery just over a week ago who presents from urgent care for anaphylaxis with rash and hypotension.  According to patient, after surgery he had showed a very small  area of allergic appearing reaction in 1 spot and was given a topical cream.  She reports after the cream was used she started have more of a reaction.  She then saw her doctor yesterday who started a Medrol Dosepak for her which she took today.  That significant exacerbated the symptoms with now diffuse urticarial rash, itching, and near syncope/lightheadedness.  She denies chest pain or palpitations or shortness of breath.  She says that when she was a child she had a prednisone allergy and due to the time since that reaction they wanted to try the steroids.  She thinks that the steroids have been worsening her reaction.  Patient went to urgent care and she was near syncopal and had a blood pressure in the 80s.  She was given a Decadron shot and reports her symptoms still have not improved.  She said that Benadryl helped earlier this morning from the itching but she is concerned about her reaction.  Otherwise she denies any nausea, vomiting, wheezing, shortness of breath, sensation of throat closing, or significant pains.  On my exam, lungs were clear with no wheezing.  Chest nontender.  Abdomen nontender.  Diffuse urticarial rash seen with no rash in the mouth.  Patient also has some swelling in her extremities slightly.  No focal unilateral swelling to suggest DVT at this time.  Patient denies any swallowing difficulty.  Denies any nausea.  Patient otherwise well-appearing.  Had a shared decision-making conversation with patient.  Given her anaphylactic reaction with multiple systems including hypotension, we agreed that we need to do more than just some antihistamines.  As she already received Decadron today and seems to have a poor reaction with steroids, we will hold on more steroids.  Instead, we agreed to do antihistamines, fluids for the soft pressures and orthostatics, and an EpiPen shot to help with the reaction.  If she proves to bili after several hours and is well-appearing without worsen  reaction, dissipate discharge with instructions to follow-up with an allergist and prescription for EpiPen.  Given her lack of focal pains or chest pain or respiratory symptoms at low space and thromboembolic disease and patient agrees together to hold on lab workup and more extensive imaging workup given her lack of other symptoms after the reaction.  Anticipate reassessment after workup.  4:59 PM I reassessed patient after the antihistamines and EpiPen and fluids and she is starting to feel better.  Itching has improved and rash appears to be settling down.  Still no other symptoms at his nausea, vomiting, chest tightness, shortness of breath, wheezing, or lightheadedness now.  Patient's heart rate is around 100 now.  It is less.  Will continue to monitor.  6:47 PM Patient examined and continues to look well.  Has some mild itching but no worsening of her rash and blood pressure still is over 100.  She is finishing some fluids and we will p.o. challenge.  If she continues to look well, anticipate discharge home for outpatient follow-up.  7:56 PM Patient having some more itching.  She is now been observed for nearly 5 hours.  If itching improves with another half dose of Benadryl, will get her ready for discharge home to follow-up with PCP for allergy testing and with prescription for EpiPen.   10:19 PM Patient has improvement in symptoms on reassessment.  She is still feeling well.  Given her well appearance, improvement in rash, improvement in itching, and rehydration I feel she is safe for discharge home.  Patient agrees with plan of care.  Will discharge for outpatient follow-up.  Will give prescription for EpiPen and she knows to take the antihistamines.  She had no questions or concerns and was discharged in good condition.         Final Clinical Impression(s) / ED Diagnoses Final diagnoses:  Anaphylaxis, initial encounter  Allergic reaction, initial encounter    Rx / DC  Orders ED Discharge Orders          Ordered    EPINEPHrine 0.3 mg/0.3 mL IJ SOAJ injection  As needed        11/11/22 2217            Clinical Impression: 1. Anaphylaxis, initial encounter   2. Allergic reaction, initial encounter     Disposition: Discharge  Condition: Good  I have discussed the results, Dx and  Tx plan with the pt(& family if present). He/she/they expressed understanding and agree(s) with the plan. Discharge instructions discussed at great length. Strict return precautions discussed and pt &/or family have verbalized understanding of the instructions. No further questions at time of discharge.    New Prescriptions   EPINEPHRINE 0.3 MG/0.3 ML IJ SOAJ INJECTION    Inject 0.3 mg into the muscle as needed for anaphylaxis.    Follow Up: Myrlene Broker, MD [address], [address] [place] [address] [phone]     [facility] Emergency Department at [facility] [address] [id number] [place] [phone]        Willo Yoon, Gwenyth Allegra, MD 11/11/22 [phone]

## 2022-11-12 ENCOUNTER — Encounter (HOSPITAL_BASED_OUTPATIENT_CLINIC_OR_DEPARTMENT_OTHER): Payer: Self-pay

## 2022-11-12 ENCOUNTER — Inpatient Hospital Stay (HOSPITAL_BASED_OUTPATIENT_CLINIC_OR_DEPARTMENT_OTHER)
Admission: EM | Admit: 2022-11-12 | Discharge: 2022-11-13 | DRG: 916 | Disposition: A | Discharge status: Other Acute Inpatient Hospital | Payer: 59 | Attending: Internal Medicine | Admitting: Internal Medicine

## 2022-11-12 DIAGNOSIS — Z885 Allergy status to narcotic agent status: Secondary | ICD-10-CM

## 2022-11-12 DIAGNOSIS — Z888 Allergy status to other drugs, medicaments and biological substances status: Secondary | ICD-10-CM

## 2022-11-12 DIAGNOSIS — R000 Tachycardia, unspecified: Secondary | ICD-10-CM | POA: Diagnosis not present

## 2022-11-12 DIAGNOSIS — Z90710 Acquired absence of both cervix and uterus: Secondary | ICD-10-CM

## 2022-11-12 DIAGNOSIS — K589 Irritable bowel syndrome without diarrhea: Secondary | ICD-10-CM | POA: Diagnosis present

## 2022-11-12 DIAGNOSIS — Z9884 Bariatric surgery status: Secondary | ICD-10-CM

## 2022-11-12 DIAGNOSIS — Z9049 Acquired absence of other specified parts of digestive tract: Secondary | ICD-10-CM

## 2022-11-12 DIAGNOSIS — T782XXA Anaphylactic shock, unspecified, initial encounter: Secondary | ICD-10-CM | POA: Diagnosis present

## 2022-11-12 DIAGNOSIS — J45909 Unspecified asthma, uncomplicated: Secondary | ICD-10-CM | POA: Diagnosis present

## 2022-11-12 DIAGNOSIS — F419 Anxiety disorder, unspecified: Secondary | ICD-10-CM | POA: Diagnosis present

## 2022-11-12 DIAGNOSIS — Z8249 Family history of ischemic heart disease and other diseases of the circulatory system: Secondary | ICD-10-CM

## 2022-11-12 DIAGNOSIS — T50905A Adverse effect of unspecified drugs, medicaments and biological substances, initial encounter: Secondary | ICD-10-CM | POA: Diagnosis present

## 2022-11-12 DIAGNOSIS — K219 Gastro-esophageal reflux disease without esophagitis: Secondary | ICD-10-CM | POA: Diagnosis present

## 2022-11-12 DIAGNOSIS — Z86718 Personal history of other venous thrombosis and embolism: Secondary | ICD-10-CM

## 2022-11-12 DIAGNOSIS — I951 Orthostatic hypotension: Secondary | ICD-10-CM | POA: Diagnosis present

## 2022-11-12 DIAGNOSIS — T886XXA Anaphylactic reaction due to adverse effect of correct drug or medicament properly administered, initial encounter: Principal | ICD-10-CM | POA: Diagnosis present

## 2022-11-12 DIAGNOSIS — T7840XA Allergy, unspecified, initial encounter: Secondary | ICD-10-CM | POA: Diagnosis not present

## 2022-11-12 DIAGNOSIS — T887XXA Unspecified adverse effect of drug or medicament, initial encounter: Secondary | ICD-10-CM

## 2022-11-12 DIAGNOSIS — E8809 Other disorders of plasma-protein metabolism, not elsewhere classified: Secondary | ICD-10-CM | POA: Diagnosis present

## 2022-11-12 DIAGNOSIS — Y92009 Unspecified place in unspecified non-institutional (private) residence as the place of occurrence of the external cause: Secondary | ICD-10-CM

## 2022-11-12 DIAGNOSIS — Z79899 Other long term (current) drug therapy: Secondary | ICD-10-CM

## 2022-11-12 DIAGNOSIS — Z833 Family history of diabetes mellitus: Secondary | ICD-10-CM

## 2022-11-12 DIAGNOSIS — F32A Depression, unspecified: Secondary | ICD-10-CM | POA: Diagnosis present

## 2022-11-12 DIAGNOSIS — T380X5A Adverse effect of glucocorticoids and synthetic analogues, initial encounter: Secondary | ICD-10-CM | POA: Diagnosis present

## 2022-11-12 LAB — CBC WITH DIFFERENTIAL/PLATELET
Abs Immature Granulocytes: 0.01 10*3/uL (ref 0.00–0.07)
Basophils Absolute: 0 10*3/uL (ref 0.0–0.1)
Basophils Relative: 0 %
Eosinophils Absolute: 0 10*3/uL (ref 0.0–0.5)
Eosinophils Relative: 0 %
HCT: 44.5 % (ref 36.0–46.0)
Hemoglobin: 15.2 g/dL — ABNORMAL HIGH (ref 12.0–15.0)
Immature Granulocytes: 0 %
Lymphocytes Relative: 23 %
Lymphs Abs: 2.1 10*3/uL (ref 0.7–4.0)
MCH: 31 pg (ref 26.0–34.0)
MCHC: 34.2 g/dL (ref 30.0–36.0)
MCV: 90.6 fL (ref 80.0–100.0)
Monocytes Absolute: 0.1 10*3/uL (ref 0.1–1.0)
Monocytes Relative: 1 %
Neutro Abs: 7 10*3/uL (ref 1.7–7.7)
Neutrophils Relative %: 76 %
Platelets: 553 10*3/uL — ABNORMAL HIGH (ref 150–400)
RBC: 4.91 MIL/uL (ref 3.87–5.11)
RDW: 12.9 % (ref 11.5–15.5)
WBC: 9.2 10*3/uL (ref 4.0–10.5)
nRBC: 0 % (ref 0.0–0.2)

## 2022-11-12 LAB — COMPREHENSIVE METABOLIC PANEL
ALT: 15 U/L (ref 0–44)
AST: 26 U/L (ref 15–41)
Albumin: 2.9 g/dL — ABNORMAL LOW (ref 3.5–5.0)
Alkaline Phosphatase: 52 U/L (ref 38–126)
Anion gap: 7 (ref 5–15)
BUN: 20 mg/dL (ref 6–20)
CO2: 23 mmol/L (ref 22–32)
Calcium: 8.2 mg/dL — ABNORMAL LOW (ref 8.9–10.3)
Chloride: 100 mmol/L (ref 98–111)
Creatinine, Ser: 0.68 mg/dL (ref 0.44–1.00)
GFR, Estimated: >60 mL/min (ref >60)
Glucose, Bld: 120 mg/dL — ABNORMAL HIGH (ref 70–99)
Potassium: 3.7 mmol/L (ref 3.5–5.1)
Sodium: 130 mmol/L — ABNORMAL LOW (ref 135–145)
Total Bilirubin: 1.2 mg/dL (ref 0.3–1.2)
Total Protein: 6.4 g/dL — ABNORMAL LOW (ref 6.5–8.1)

## 2022-11-12 LAB — CBC
HCT: 46.1 % — ABNORMAL HIGH (ref 36.0–46.0)
Hemoglobin: 14.9 g/dL (ref 12.0–15.0)
MCH: 30.5 pg (ref 26.0–34.0)
MCHC: 32.3 g/dL (ref 30.0–36.0)
MCV: 94.3 fL (ref 80.0–100.0)
Platelets: 457 10*3/uL — ABNORMAL HIGH (ref 150–400)
RBC: 4.89 MIL/uL (ref 3.87–5.11)
RDW: 13.1 % (ref 11.5–15.5)
WBC: 8.8 10*3/uL (ref 4.0–10.5)
nRBC: 0 % (ref 0.0–0.2)

## 2022-11-12 LAB — MRSA NEXT GEN BY PCR, NASAL: MRSA by PCR Next Gen: NOT DETECTED

## 2022-11-12 LAB — CREATININE, SERUM
Creatinine, Ser: 0.72 mg/dL (ref 0.44–1.00)
GFR, Estimated: >60 mL/min (ref >60)

## 2022-11-12 MED ORDER — ONDANSETRON HCL 4 MG PO TABS: 4.0000 mg | ORAL_TABLET | Freq: Four times a day (QID) | ORAL | Status: DC | PRN

## 2022-11-12 MED ORDER — LACTATED RINGERS IV BOLUS
1000.0000 mL | Freq: Once | INTRAVENOUS | Status: AC
  Administered 2022-11-12: 1000 mL via INTRAVENOUS

## 2022-11-12 MED ORDER — ENOXAPARIN SODIUM 40 MG/0.4ML IJ SOSY
40.0000 mg | PREFILLED_SYRINGE | INTRAMUSCULAR | Status: DC
  Administered 2022-11-12: 40 mg via SUBCUTANEOUS
  Filled 2022-11-12: qty 0.4

## 2022-11-12 MED ORDER — EPINEPHRINE 0.3 MG/0.3ML IJ SOAJ
0.3000 mg | Freq: Once | INTRAMUSCULAR | Status: AC
  Administered 2022-11-12: 0.3 mg via INTRAMUSCULAR
  Filled 2022-11-12: qty 0.3

## 2022-11-12 MED ORDER — DIPHENHYDRAMINE HCL 50 MG/ML IJ SOLN
50.0000 mg | Freq: Four times a day (QID) | INTRAMUSCULAR | Status: DC | PRN
  Administered 2022-11-12 – 2022-11-13 (×4): 50 mg via INTRAVENOUS
  Filled 2022-11-12 (×4): qty 1

## 2022-11-12 MED ORDER — FAMOTIDINE IN NACL 20-0.9 MG/50ML-% IV SOLN
20.0000 mg | Freq: Two times a day (BID) | INTRAVENOUS | Status: DC
  Administered 2022-11-12 – 2022-11-13 (×2): 20 mg via INTRAVENOUS
  Filled 2022-11-12 (×2): qty 50

## 2022-11-12 MED ORDER — ORAL CARE MOUTH RINSE: 15.0000 mL | OROMUCOSAL | Status: DC | PRN

## 2022-11-12 MED ORDER — DIPHENHYDRAMINE HCL 25 MG PO CAPS: 50.0000 mg | ORAL_CAPSULE | Freq: Four times a day (QID) | ORAL | Status: DC | PRN

## 2022-11-12 MED ORDER — ONDANSETRON HCL 4 MG/2ML IJ SOLN: 4.0000 mg | Freq: Four times a day (QID) | INTRAMUSCULAR | Status: DC | PRN

## 2022-11-12 MED ORDER — DEXTROSE-NACL 5-0.9 % IV SOLN: INTRAVENOUS | Status: DC

## 2022-11-12 MED ORDER — FAMOTIDINE IN NACL 20-0.9 MG/50ML-% IV SOLN
20.0000 mg | Freq: Once | INTRAVENOUS | Status: AC
  Administered 2022-11-12: 20 mg via INTRAVENOUS
  Filled 2022-11-12: qty 50

## 2022-11-12 MED ORDER — DIPHENHYDRAMINE HCL 50 MG/ML IJ SOLN
50.0000 mg | Freq: Once | INTRAMUSCULAR | Status: AC
  Administered 2022-11-12: 50 mg via INTRAVENOUS
  Filled 2022-11-12: qty 1

## 2022-11-12 NOTE — ED Provider Notes (Cosign Needed Addendum)
[place] [facility] Provider Note   CSN: [id number] Arrival date & time: 11/12/22  1414   History Allergic reaction Chief Complaint  Patient presents with   Allergic Reaction    Krista [name] is a 42 y.o. female.  42 y.o female with a PMH of Allergic reaction presents to the ED with a complaint of recurrent allergic reaction.  Patient reports she had breast reduction surgery on November 02, 2022, states that yesterday she had an allergic reaction to a steroid that she was placed on, at urgent care she received a shot of Decadron, reports that she broke out into hives, felt worse had some hypotension along with feeling lightheaded.  On today's visit, she arrives as her blood pressure continues to wax and wane over the last couple days, rash has now returned and has continued to spread to her legs.  She reports this is pruritic in nature, not really improving.  She did take some Benadryl around 11 AM reports there has not been any improvement in symptoms.  She does not have any systemic signs such as diarrhea, abdominal pain, shortness of breath, chest pain.  The history is provided by the patient.  Allergic Reaction Presenting symptoms: rash        Home Medications Prior to Admission medications   Medication Sig Start Date End Date Taking? Authorizing Provider  albuterol (VENTOLIN HFA) 108 (90 Base) MCG/ACT inhaler Inhale 2 puffs into the lungs as needed for wheezing (exercise-induced bronchospasm prevention). 11/21/18  Yes [provider]  buPROPion (WELLBUTRIN XL) 300 MG 24 hr tablet Take 300 mg by mouth daily. 08/25/22  Yes [provider]  EPINEPHrine 0.3 mg/0.3 mL IJ SOAJ injection Inject 0.3 mg into the muscle as needed for anaphylaxis. 11/11/22  Yes Tegeler, Canary Brimhristopher J, MD  omeprazole (PRILOSEC) 40 MG capsule Take 40 mg by mouth daily. 05/24/22  Yes [provider]      Allergies    Decadron [dexamethasone], Medrol  [methylprednisolone], and Prednisone    Review of Systems   Review of Systems  Constitutional:  Negative for chills and fever.  HENT:  Negative for sore throat.   Respiratory:  Negative for shortness of breath.   Cardiovascular:  Negative for chest pain.  Gastrointestinal:  Negative for abdominal pain, diarrhea, nausea and vomiting.  Genitourinary:  Negative for flank pain.  Musculoskeletal:  Negative for back pain.  Skin:  Positive for rash.  All other systems reviewed and are negative.   Physical Exam Updated Vital Signs BP (!) 103/56   Pulse (!) 114   Temp 97.9 F (36.6 C)   Resp (!) 21   LMP 09/06/2014   SpO2 100%  Physical Exam Vitals and nursing note reviewed.  Constitutional:      Appearance: Normal appearance.  HENT:     Head: Normocephalic and atraumatic.     Mouth/Throat:     Mouth: Mucous membranes are moist.  Eyes:     Pupils: Pupils are equal, round, and reactive to light.  Cardiovascular:     Rate and Rhythm: Tachycardia present.  Pulmonary:     Effort: Pulmonary effort is normal.     Breath sounds: No wheezing or rales.  Abdominal:     General: Abdomen is flat.     Palpations: Abdomen is soft.     Tenderness: There is no abdominal tenderness.  Musculoskeletal:     Cervical back: Normal range of motion and neck supple.  Skin:  Findings: Rash present.  Neurological:     Mental Status: She is alert and oriented to person, place, and time.            ED Results / Procedures / Treatments   Labs (all labs ordered are listed, but only abnormal results are displayed) Labs Reviewed  CBC WITH DIFFERENTIAL/PLATELET - Abnormal; Notable for the following components:      Result Value   Hemoglobin 15.2 (*)    Platelets 553 (*)    All other components within normal limits  COMPREHENSIVE METABOLIC PANEL - Abnormal; Notable for the following components:   Sodium 130 (*)    Glucose, Bld 120 (*)    Calcium 8.2 (*)    Total Protein 6.4 (*)     Albumin 2.9 (*)    All other components within normal limits    EKG None  Radiology No results found.  Procedures .Critical Care  Performed by: Claude Manges, PA-C Authorized by: Claude Manges, PA-C   Critical care provider statement:    Critical care time (minutes):  35   Critical care start time:  11/12/2022 3:00 PM   Critical care end time:  11/12/2022 3:35 PM   Critical care was necessary to treat or prevent imminent or life-threatening deterioration of the following conditions:  Shock   Critical care was time spent personally by me on the following activities:  Evaluation of patient's response to treatment   Care discussed with: admitting provider       Medications Ordered in ED Medications  famotidine (PEPCID) IVPB 20 mg premix (0 mg Intravenous Stopped 11/12/22 1545)  diphenhydrAMINE (BENADRYL) injection 50 mg (50 mg Intravenous Given 11/12/22 1439)  lactated ringers bolus 1,000 mL (0 mLs Intravenous Stopped 11/12/22 1546)  EPINEPHrine (EPI-PEN) injection 0.3 mg (0.3 mg Intramuscular Given 11/12/22 1447)    ED Course/ Medical Decision Making/ A&P                             Medical Decision Making Amount and/or Complexity of Data Reviewed Labs: ordered.  Risk Prescription drug management. Decision regarding hospitalization.   This patient presents to the ED for concern of allergic reaction, this involves a number of treatment options, and is a complaint that carries with it a high risk of complications and morbidity.  The differential diagnosis includes anaphylaxis, allergic reaction.    Co morbidities: Discussed in HPI   Brief History:  See HPI.   EMR reviewed including pt PMHx, past surgical history and past visits to ER.   See HPI for more details   Lab Tests:  I ordered and independently interpreted labs.  The pertinent results include:    Labs notable for CBC with no leukocytosis, hemoglobin is slightly elevated.   Imaging Studies:  No imaging  studies ordered for this patient  Cardiac Monitoring:  The patient was maintained on a cardiac monitor.  I personally viewed and interpreted the cardiac monitored which showed an underlying rhythm of: Sinus tachycardia EKG non-ischemic   Medicines ordered:  I ordered medication including Epi  for hypotension and rash  Reevaluation of the patient after these medicines showed that the patient improved I have reviewed the patients home medicines and have made adjustments as needed  Critical Interventions:  In the setting of hypotension along with rash patient was given epinephrine IM.  Reevaluation:  After the interventions noted above I re-evaluated patient and found that they have :stayed the same  Social Determinants of Health:  The patient's social determinants of health were a factor in the care of this patient  Problem List / ED Course:  Patient presents to the ED with a chief complaint of rash that has been ongoing for the past 48 hours.  Patient reports that she had surgery on 26 March, developed an allergic reaction to this, she reports she started using cream however she saw her doctor 2 days ago who gave her a prescription for Medrol Dosepak, she did take this today, this exacerbated her rash, evaluated urgent care and given a shot of Decadron, reports her symptoms have now worsened the rash has spread through her upper and lower extremities including her abdomen.  She was evaluated here yesterday in the emergency department, given epinephrine due to hypotension along with rash, today she returns as her rash is now worse and she continues to feel lightheaded and hypotensive.  Patient does have a prior history of allergic reaction to steroids.  She does not have any systemic signs today such as wheezing, diarrhea, abdominal pain, nausea or vomiting, but was hypotensive on arrival to the emergency department.  She was provided with Pepcid, Benadryl, bolus to help with blood  pressure.  Seeing as this is patient's second visit in the past 48 hours for hypotension along with rash, I do feel that she meets criteria for admission. Spoke to hospitalist service, appreciate their assistance who will admit patient for further management.  He remains hemodynamically stable with a systolic pressure in the 100s.  Continues to not voice any shortness of breath, chest pain, or other symptoms.  Dispostion:  After consideration of the diagnostic results and the patients response to treatment, I feel that the patent would benefit from admission.    Portions of this note were generated with Scientist, clinical (histocompatibility and immunogenetics)Dragon dictation software. Dictation errors may occur despite best attempts at proofreading.   Final Clinical Impression(s) / ED Diagnoses Final diagnoses:  Allergic reaction, initial encounter    Rx / DC Orders ED Discharge Orders     None         Claude MangesSoto, Rayyan Orsborn, PA-C 11/12/22 1602    Claude MangesSoto, Kenon Delashmit, PA-C 11/12/22 1612    Melene PlanFloyd, Dan, DO 11/13/22 [phone]

## 2022-11-12 NOTE — ED Triage Notes (Signed)
Pt allergic to steroid, had been started on a steroid pack on 4/3, went to urgent care and was given IM injection of steroids. Seen here yesterday for same. Large generalized hives, hypotension when standing, lightheadedness. Denies trouble breathing/swallowing.

## 2022-11-12 NOTE — Progress Notes (Signed)
TR[facility] Admits notified of patient's arrival to unit.

## 2022-11-12 NOTE — Progress Notes (Signed)
  Transition of Care [facility]) Screening Note   Patient Details  Name: Krista [name] Date of Birth: 1980-11-04   Transition of Care [facility]) CM/SW Contact:    Lavenia Atlas, RN Phone Number: 11/12/2022, 6:45 PM    Transition of Care Department [facility]) has reviewed patient and no TOC needs have been identified at this time. We will continue to monitor patient advancement through interdisciplinary progression rounds. If new patient transition needs arise, please place a TOC consult.

## 2022-11-12 NOTE — ED Notes (Signed)
Pt attempted to stand for orthostatic vitals stated she feels too dizzy and laid back down immediately after standing .

## 2022-11-12 NOTE — H&P (Signed)
History and Physical    Patient: Krista [name] JXB:[id number] DOB: 1981/02/05 DOA: 11/12/2022 DOS: the patient was seen and examined on 11/12/2022 PCP: Hadley Penobbins, Robert A, MD  Patient coming from: {Point_of_Origin:26777}  Chief Complaint:  Chief Complaint  Patient presents with   Allergic Reaction   HPI: Krista BrooklynHannah M Ohern is a 42 y.o. female with medical history significant of ***  Review of Systems: {ROS_Text:26778} Past Medical History:  Diagnosis Date   Asthma    exercise induced   Complication of anesthesia    DVT (deep venous thrombosis)    Family history of adverse reaction to anesthesia    PONV   Obesity    PONV (postoperative nausea and vomiting)    Past Surgical History:  Procedure Laterality Date   ABDOMINAL HYSTERECTOMY     BREAST REDUCTION SURGERY Bilateral 11/02/2022   Procedure: MAMMARY REDUCTION  (BREAST);  Surgeon: Glenna Fellowshimmappa, Brinda, MD;  Location: [facility];  Service: Plastics;  Laterality: Bilateral;   CESAREAN SECTION     CESAREAN SECTION N/A 05/18/2015   Procedure: CESAREAN SECTION;  Surgeon: Harold HedgeJames Tomblin, MD;  Location: [facility];  Service: Obstetrics;  Laterality: N/A;  Repeat edc 06/13/15 allergies to codeine, prednisone, DHA supplements   CHOLECYSTECTOMY     KNEE SURGERY     Social History:  reports that she has never smoked. She has never used smokeless tobacco. She reports that she does not drink alcohol and does not use drugs.  Allergies  Allergen Reactions   Decadron [Dexamethasone] Anaphylaxis   Medrol [Methylprednisolone] Anaphylaxis   Prednisone Hives, Itching, Swelling and Other (See Comments)    Lips and tongue swell      Family History  Problem Relation Age of Onset   Hypertension Mother    Diabetes Mother    Hypertension Father    Chromosomal disorder Son        duplication chromosome 5    Prior to Admission medications   Medication Sig Start Date End Date Taking? Authorizing Provider  albuterol (VENTOLIN HFA) 108  (90 Base) MCG/ACT inhaler Inhale 2 puffs into the lungs as needed for wheezing (exercise-induced bronchospasm prevention). 11/21/18  Yes [provider]  buPROPion (WELLBUTRIN XL) 300 MG 24 hr tablet Take 300 mg by mouth daily. 08/25/22  Yes [provider]  EPINEPHrine 0.3 mg/0.3 mL IJ SOAJ injection Inject 0.3 mg into the muscle as needed for anaphylaxis. 11/11/22  Yes Tegeler, Canary Brimhristopher J, MD  omeprazole (PRILOSEC) 40 MG capsule Take 40 mg by mouth daily. 05/24/22  Yes [provider]    Physical Exam: Vitals:   11/12/22 1630 11/12/22 1749 11/12/22 1751 11/12/22 1800  BP: 104/63  (!) 105/56 (!) 96/53  Pulse: (!) 131  (!) 134 (!) 122  Resp: (!) 21 14 17  (!) 22  Temp:   98.4 F (36.9 C)   TempSrc:   Oral   SpO2: 100%  97% 99%  Weight:   56.4 kg   Height:   4\' 11"  (1.499 m)    *** Data Reviewed: {Tip this will not be part of the note when signed- Document your independent interpretation of telemetry tracing, EKG, lab, Radiology test or any other diagnostic tests. Add any new diagnostic test ordered today. (Optional):26781} {Results:26384}  Assessment and Plan: No notes have been filed under this hospital service. Service: Hospitalist     Advance Care Planning:   Code Status: Full Code ***  Consults: ***  Family Communication: ***  Severity of Illness: {Observation/Inpatient:21159}  Author: Lonia BloodGARBA,LAWAL,  MD 11/12/2022 6:41 PM  For on call review [email or web address].

## 2022-11-13 ENCOUNTER — Inpatient Hospital Stay (HOSPITAL_COMMUNITY): Payer: 59

## 2022-11-13 ENCOUNTER — Other Ambulatory Visit: Payer: Self-pay

## 2022-11-13 DIAGNOSIS — F32A Depression, unspecified: Secondary | ICD-10-CM | POA: Diagnosis not present

## 2022-11-13 DIAGNOSIS — I10 Essential (primary) hypertension: Secondary | ICD-10-CM | POA: Diagnosis not present

## 2022-11-13 DIAGNOSIS — Z885 Allergy status to narcotic agent status: Secondary | ICD-10-CM | POA: Diagnosis not present

## 2022-11-13 DIAGNOSIS — T50905D Adverse effect of unspecified drugs, medicaments and biological substances, subsequent encounter: Secondary | ICD-10-CM | POA: Diagnosis not present

## 2022-11-13 DIAGNOSIS — T782XXD Anaphylactic shock, unspecified, subsequent encounter: Secondary | ICD-10-CM

## 2022-11-13 DIAGNOSIS — F419 Anxiety disorder, unspecified: Secondary | ICD-10-CM | POA: Diagnosis not present

## 2022-11-13 DIAGNOSIS — T380X5A Adverse effect of glucocorticoids and synthetic analogues, initial encounter: Secondary | ICD-10-CM | POA: Diagnosis not present

## 2022-11-13 DIAGNOSIS — E8809 Other disorders of plasma-protein metabolism, not elsewhere classified: Secondary | ICD-10-CM | POA: Diagnosis not present

## 2022-11-13 DIAGNOSIS — R000 Tachycardia, unspecified: Secondary | ICD-10-CM | POA: Diagnosis not present

## 2022-11-13 DIAGNOSIS — I951 Orthostatic hypotension: Secondary | ICD-10-CM | POA: Diagnosis not present

## 2022-11-13 DIAGNOSIS — T782XXA Anaphylactic shock, unspecified, initial encounter: Secondary | ICD-10-CM | POA: Diagnosis not present

## 2022-11-13 DIAGNOSIS — Z90710 Acquired absence of both cervix and uterus: Secondary | ICD-10-CM | POA: Diagnosis not present

## 2022-11-13 DIAGNOSIS — T886XXA Anaphylactic reaction due to adverse effect of correct drug or medicament properly administered, initial encounter: Secondary | ICD-10-CM | POA: Diagnosis not present

## 2022-11-13 DIAGNOSIS — J45909 Unspecified asthma, uncomplicated: Secondary | ICD-10-CM | POA: Diagnosis not present

## 2022-11-13 DIAGNOSIS — Z9049 Acquired absence of other specified parts of digestive tract: Secondary | ICD-10-CM | POA: Diagnosis not present

## 2022-11-13 DIAGNOSIS — Y92009 Unspecified place in unspecified non-institutional (private) residence as the place of occurrence of the external cause: Secondary | ICD-10-CM | POA: Diagnosis not present

## 2022-11-13 DIAGNOSIS — K589 Irritable bowel syndrome without diarrhea: Secondary | ICD-10-CM | POA: Diagnosis not present

## 2022-11-13 DIAGNOSIS — K219 Gastro-esophageal reflux disease without esophagitis: Secondary | ICD-10-CM | POA: Diagnosis not present

## 2022-11-13 DIAGNOSIS — L509 Urticaria, unspecified: Secondary | ICD-10-CM | POA: Diagnosis not present

## 2022-11-13 DIAGNOSIS — Z9884 Bariatric surgery status: Secondary | ICD-10-CM | POA: Diagnosis not present

## 2022-11-13 DIAGNOSIS — Z888 Allergy status to other drugs, medicaments and biological substances status: Secondary | ICD-10-CM | POA: Diagnosis not present

## 2022-11-13 DIAGNOSIS — Z79899 Other long term (current) drug therapy: Secondary | ICD-10-CM | POA: Diagnosis not present

## 2022-11-13 DIAGNOSIS — Z86718 Personal history of other venous thrombosis and embolism: Secondary | ICD-10-CM | POA: Diagnosis not present

## 2022-11-13 DIAGNOSIS — T50905A Adverse effect of unspecified drugs, medicaments and biological substances, initial encounter: Secondary | ICD-10-CM | POA: Diagnosis not present

## 2022-11-13 DIAGNOSIS — Z8249 Family history of ischemic heart disease and other diseases of the circulatory system: Secondary | ICD-10-CM | POA: Diagnosis not present

## 2022-11-13 DIAGNOSIS — Z833 Family history of diabetes mellitus: Secondary | ICD-10-CM | POA: Diagnosis not present

## 2022-11-13 LAB — COMPREHENSIVE METABOLIC PANEL
ALT: 12 U/L (ref 0–44)
AST: 22 U/L (ref 15–41)
Albumin: <1.5 g/dL — ABNORMAL LOW (ref 3.5–5.0)
Alkaline Phosphatase: 46 U/L (ref 38–126)
Anion gap: 8 (ref 5–15)
BUN: 21 mg/dL — ABNORMAL HIGH (ref 6–20)
CO2: 21 mmol/L — ABNORMAL LOW (ref 22–32)
Calcium: 7.6 mg/dL — ABNORMAL LOW (ref 8.9–10.3)
Chloride: 107 mmol/L (ref 98–111)
Creatinine, Ser: <0.3 mg/dL — ABNORMAL LOW (ref 0.44–1.00)
Glucose, Bld: 125 mg/dL — ABNORMAL HIGH (ref 70–99)
Potassium: 4.9 mmol/L (ref 3.5–5.1)
Sodium: 136 mmol/L (ref 135–145)
Total Bilirubin: 0.4 mg/dL (ref 0.3–1.2)
Total Protein: 4.9 g/dL — ABNORMAL LOW (ref 6.5–8.1)

## 2022-11-13 LAB — ECHOCARDIOGRAM COMPLETE
AR max vel: 2.1 cm2
AV Area VTI: 1.95 cm2
AV Area mean vel: 2.04 cm2
AV Mean grad: 5 mmHg
AV Peak grad: 8.8 mmHg
Ao pk vel: 1.48 m/s
Area-P 1/2: 4.99 cm2
Height: 59 in
S' Lateral: 2.3 cm
Weight: 1989.43 oz

## 2022-11-13 LAB — CK TOTAL AND CKMB (NOT AT ARMC)
CK, MB: 2.7 ng/mL (ref 0.5–5.0)
Total CK: 13 U/L — ABNORMAL LOW (ref 38–234)

## 2022-11-13 LAB — CBC
HCT: 42.5 % (ref 36.0–46.0)
Hemoglobin: 14.2 g/dL (ref 12.0–15.0)
MCH: 30.6 pg (ref 26.0–34.0)
MCHC: 33.4 g/dL (ref 30.0–36.0)
MCV: 91.6 fL (ref 80.0–100.0)
Platelets: 474 10*3/uL — ABNORMAL HIGH (ref 150–400)
RBC: 4.64 MIL/uL (ref 3.87–5.11)
RDW: 13 % (ref 11.5–15.5)
WBC: 9.9 10*3/uL (ref 4.0–10.5)
nRBC: 0 % (ref 0.0–0.2)

## 2022-11-13 LAB — LACTIC ACID, PLASMA: Lactic Acid, Venous: 1 mmol/L (ref 0.5–1.9)

## 2022-11-13 LAB — HIV ANTIBODY (ROUTINE TESTING W REFLEX): HIV Screen 4th Generation wRfx: NONREACTIVE

## 2022-11-13 MED ORDER — DIPHENHYDRAMINE HCL 25 MG PO CAPS
25.0000 mg | ORAL_CAPSULE | Freq: Four times a day (QID) | ORAL | Status: DC
  Administered 2022-11-13 (×2): 25 mg via ORAL
  Filled 2022-11-13 (×2): qty 1

## 2022-11-13 MED ORDER — EPINEPHRINE 0.3 MG/0.3ML IJ SOAJ
0.3000 mg | INTRAMUSCULAR | Status: DC | PRN
  Filled 2022-11-13 (×2): qty 0.3

## 2022-11-13 MED ORDER — FAMOTIDINE IN NACL 20-0.9 MG/50ML-% IV SOLN: 20.0000 mg | Freq: Two times a day (BID) | INTRAVENOUS | Status: DC

## 2022-11-13 MED ORDER — ENOXAPARIN SODIUM 40 MG/0.4ML IJ SOSY: 40.0000 mg | PREFILLED_SYRINGE | INTRAMUSCULAR | Status: DC

## 2022-11-13 MED ORDER — CALAMINE EX LOTN: 1.0000 | TOPICAL_LOTION | CUTANEOUS | Status: DC | PRN

## 2022-11-13 MED ORDER — PANTOPRAZOLE SODIUM 40 MG PO TBEC
40.0000 mg | DELAYED_RELEASE_TABLET | Freq: Every day | ORAL | Status: DC
  Administered 2022-11-13: 40 mg via ORAL
  Filled 2022-11-13: qty 1

## 2022-11-13 MED ORDER — DIPHENHYDRAMINE HCL 25 MG PO CAPS: 25.0000 mg | ORAL_CAPSULE | Freq: Four times a day (QID) | ORAL | Status: DC

## 2022-11-13 MED ORDER — CALAMINE EX LOTN
1.0000 | TOPICAL_LOTION | CUTANEOUS | Status: DC | PRN
  Administered 2022-11-13: 1 via TOPICAL
  Filled 2022-11-13: qty 177

## 2022-11-13 MED ORDER — SODIUM CHLORIDE 0.9% FLUSH
10.0000 mL | Freq: Two times a day (BID) | INTRAVENOUS | Status: DC
  Administered 2022-11-13: 10 mL

## 2022-11-13 MED ORDER — SODIUM CHLORIDE 0.9 % IV SOLN: INTRAVENOUS | Status: DC

## 2022-11-13 MED ORDER — ALBUTEROL SULFATE (2.5 MG/3ML) 0.083% IN NEBU: 2.5000 mg | INHALATION_SOLUTION | RESPIRATORY_TRACT | Status: DC | PRN

## 2022-11-13 MED ORDER — BUPROPION HCL ER (XL) 300 MG PO TB24
300.0000 mg | ORAL_TABLET | Freq: Every day | ORAL | Status: DC
  Administered 2022-11-13: 300 mg via ORAL
  Filled 2022-11-13: qty 1

## 2022-11-13 MED ORDER — ACETAMINOPHEN 325 MG PO TABS: 650.0000 mg | ORAL_TABLET | Freq: Four times a day (QID) | ORAL | Status: DC | PRN

## 2022-11-13 MED ORDER — SODIUM CHLORIDE 0.9 % IV SOLN: 125.0000 mL | INTRAVENOUS | Status: DC

## 2022-11-13 MED ORDER — LACTATED RINGERS IV BOLUS: 1000.0000 mL | Freq: Once | INTRAVENOUS | Status: DC

## 2022-11-13 MED ORDER — EPINEPHRINE (ANAPHYLAXIS) 1 MG/ML IJ SOLN: 0.3000 mg | INTRAMUSCULAR | Status: DC | PRN

## 2022-11-13 MED ORDER — SODIUM CHLORIDE 0.9 % IV BOLUS
1000.0000 mL | Freq: Once | INTRAVENOUS | Status: AC
  Administered 2022-11-13: 1000 mL via INTRAVENOUS

## 2022-11-13 MED ORDER — LACTATED RINGERS IV SOLN: INTRAVENOUS | Status: DC

## 2022-11-13 MED ORDER — EPINEPHRINE PF 1 MG/ML IJ SOLN: 0.5000 mg | INTRAMUSCULAR | Status: DC | PRN

## 2022-11-13 MED ORDER — CHLORHEXIDINE GLUCONATE CLOTH 2 % EX PADS: 6.0000 | MEDICATED_PAD | Freq: Every day | CUTANEOUS | Status: DC

## 2022-11-13 MED ORDER — SODIUM CHLORIDE 0.9% FLUSH: 10.0000 mL | INTRAVENOUS | Status: DC | PRN

## 2022-11-13 MED ORDER — DIPHENHYDRAMINE HCL 50 MG/ML IJ SOLN: 50.0000 mg | Freq: Four times a day (QID) | INTRAMUSCULAR | Status: DC | PRN

## 2022-11-13 MED ORDER — EPINEPHRINE PF 1 MG/ML IJ SOLN
0.5000 mg | INTRAMUSCULAR | Status: DC | PRN
  Administered 2022-11-13 (×6): 0.5 mg via INTRAMUSCULAR
  Filled 2022-11-13 (×20): qty 1

## 2022-11-13 NOTE — Progress Notes (Signed)
Nurse notified by tech that patients IV had become dislodged. Upon entering the room patient and husband stated that new hives have appeared on the patients face and neck within the last 20-30 minutes. Lips have also become more swollen. The patient states she does not have an increase in difficulty breathing and that other than the itching she is not being bothered by the new hives. Provider notified of loss of IV site and new hives, will give prn epi now once received from pharmacy and await further instructions.

## 2022-11-13 NOTE — Progress Notes (Addendum)
Addendum  [facility] is on the way to transfer patient from [facility] to [facility].  Reassessed patient at bedside.  Feels better than before.  No new complaints.  Most recent BP 111/55.  Telemetry personally reviewed: Since this morning was initially in sinus rhythm then sinus tachycardia is up to 120.  Has received multiple doses of as needed epi since this morning (6 doses) including a dose at 2:12 PM.  Discussed in detail with patient, spouse, patient's sister and mother at bedside.  Updated care and answered all questions.  Patient stated that she had not used steroids since high school until few days ago.  Completed EMTALA form.  Discussed with patient's RN at bedside.  Marcellus Scott, MD,  FACP, FHM, [facility], [facility], [facility]   [facility] & Physician Advisor [place]     To contact the attending provider between 7A-7P or the covering provider during after hours 7P-7A, please log into the web site [email or web address] and access using universal [place] password for that web site. If you do not have the password, please call the hospital operator.

## 2022-11-13 NOTE — Progress Notes (Signed)
   Bedside review - CCM  HR 130s SBP 106 and Doast 40, MAP > 65 -  BAeelinse BP in office last few months reviewed and they are most recently in 98/64, 104/72, 112/76 and 100/72 -   BAesline BP is all similra SBP but current diast is lower though overall BP adeuage  Has had multiple frequent epi injectisn - no change to HR or BP or rash but she is not worse either  RN says initial fluid bolus improved BP  RAsh still + and itchby but not worse  Echo in progress PICC line complete  R  Object No airway compromise Rash + Look stable   A Anaphylasix -  TAchy and soft BP and rash - responded to fluids ,. Ha shad mulple epis IM injections but flat response  Plan  - back off epi injections -0 can give HR > 140 or rash worse or SBP drop < 95 or airway compromise  - await tryptase - fluid bolus - ok for transfer to [facility]  20 min additional critical car etime   SIGNATURE    Dr. Kalman Shan, M.D., F.C.C.P,  Pulmonary and Critical Care Medicine Staff Physician, [facility] Center Director - Interstitial Lung Disease  Program  Medical Director - Ger[facility] Spore Long ICU Pulmonary Fibrosis [facility] at [facility] [place], [place], [address]   Pager: [phone], If no answer  -> Check AMION or Try [phone] Telephone (clinical office): [phone] Telephone (research): [phone]  2:56 PM 11/13/2022

## 2022-11-13 NOTE — Progress Notes (Signed)
    D/w RN  Patient has had 0.5mg  IM x 3 in last hour --> patient itchiness is better and HR had improved 110s and BP imprved 104/47 . No resp distress -> PICC line progress. Fluid bolus at 125/cch in progress. -> RN to update after PICC line Per Dr Waymon Amato  Accepted at [facility]     SIGNATURE    Dr. Kalman Shan, M.D., F.C.C.P,  Pulmonary and Critical Care Medicine Staff Physician, [facility] Center Director - Interstitial Lung Disease  Program  Medical Director - Gerri Spore Long ICU Pulmonary Fibrosis [facility] at [facility] [place], [place], [address]   Pager: [phone], If no answer  -> Check AMION or Try [phone] Telephone (clinical office): [phone] Telephone (research): [phone]  1:18 PM 11/13/2022

## 2022-11-13 NOTE — Progress Notes (Signed)
Peripherally Inserted Central Catheter Placement  The IV Nurse has discussed with the patient and/or persons authorized to consent for the patient, the purpose of this procedure and the potential benefits and risks involved with this procedure.  The benefits include less needle sticks, lab draws from the catheter, and the patient may be discharged home with the catheter. Risks include, but not limited to, infection, bleeding, blood clot (thrombus formation), and puncture of an artery; nerve damage and irregular heartbeat and possibility to perform a PICC exchange if needed/ordered by physician.  Alternatives to this procedure were also discussed.  Bard Power PICC patient education guide, fact sheet on infection prevention and patient information card has been provided to patient /or left at bedside.    PICC Placement Documentation  PICC Double Lumen 11/13/22 Right Basilic 33 cm 0 cm (Active)  Indication for Insertion or Continuance of Line Limited venous access - need for IV therapy >5 days (PICC only) 11/13/22 1320  Exposed Catheter (cm) 0 cm 11/13/22 1320  Site Assessment Clean, Dry, Intact 11/13/22 1320  Lumen #1 Status Saline locked;Flushed;Blood return noted 11/13/22 1320  Lumen #2 Status Saline locked;Flushed;Blood return noted 11/13/22 1320  Dressing Type Transparent;Securing device 11/13/22 1320  Dressing Status Antimicrobial disc in place;Clean, Dry, Intact 11/13/22 1320  Safety Lock Not Applicable 11/13/22 1320  Line Care Connections checked and tightened 11/13/22 1320  Line Adjustment (NICU/IV Team Only) No 11/13/22 1320  Dressing Intervention New dressing 11/13/22 1320  Dressing Change Due 11/20/22 11/13/22 1320       Krista [name] Chenice 11/13/2022, 1:21 PM

## 2022-11-13 NOTE — Discharge Summary (Signed)
Physician Discharge Summary  Krista [name] VZD:[id number] DOB: February 28, 1981  PCP: Hadley Pen, MD  Admitted from: Home Discharged to: [facility]  Admit date: 11/12/2022 Discharge date: 11/13/2022  Recommendations for Outpatient Follow-up:    Follow-up Information     Hadley Pen, MD. Schedule an appointment as soon as possible for a visit.   Specialty: Family Medicine Contact information: [address] [place] [address] [phone]         Dr. Justin Mend Follow up.   Why: Patient bring transferred to [facility] stepdown unit for further evaluation and management including dermatology/allergist consultation and may need ICU level care.        Glenna Fellows, MD. Schedule an appointment as soon as possible for a visit.   Specialty: Plastic Surgery Contact information: [address] [place] [address] [place] [address] [phone]                  Home Health: N/A    Equipment/Devices: N/A    Discharge Condition: Critical and guarded   Code Status: Full Code Diet recommendation:     Discharge Diagnoses:  Principal Problem:   Anaphylaxis Active Problems:   Drug reaction   Brief Summary: 42 year old married female with medical history significant for DVT, anxiety, IBS, OAB, morbid obesity s/p gastric sleeve, hysterectomy, urogenital fistula, recent bilateral breast reduction surgery on 3/26 presented to the ED on 11/12/2022 with progressive pleuritic diffuse skin rash after recently receiving steroids.  Started on Medrol dose pack by plastic surgeon on 4/2 which she started taking on 4/3 for a skin rash on the lower abdomen felt to be due to surgical glue allergy, rash progressively got worse, diffuse with whelps.  [facility] where she was diagnosed with urticaria, anaphylactic shock, hypotension with BP of 87/63, proteinuria and orthostatic hypotension.   She was given Decadron IM without improvement in symptoms and presented to the ED on 11/11/2022.  Treated with EpiPen, antihistaminics, IV fluids, Benadryl, improved and was discharged home.  She presented again to the ED on 4/5 with large generalized hives, hypotension when standing, lightheadedness.  Admitted for severe allergic reaction, suspected due to steroids.  Admitted to stepdown, treating with as needed IM EpiPen, scheduled IV Pepcid, scheduled oral Benadryl and as needed IV Benadryl, aggressive IV fluid resuscitation.  Patient had recurrence of urticaria, some hemodynamic compromise as evidenced by soft blood pressures with SBP's in the 90s and MAP in the low 60s.  PCCM consulted.  Continuing aggressive management with frequent doses of IM EpiPen.  Agree with PCCM regarding transfer to higher level of care/[facility] for evaluation by allergist/dermatologist for expert input and stabilization.     Assessment & Plan:   Anaphylaxis: Patient reports history of lip swelling to prednisone while she was in high school.  Rest of history as noted above.  Allergic reaction likely precipitated by steroids, Decadron/Medrol/prednisone now listed as allergies in [facility].  Admitted to stepdown, treating with as needed IM EpiPen, scheduled IV Pepcid, scheduled oral Benadryl and as needed IV Benadryl, aggressive IV fluid resuscitation.  Patient had recurrence of urticaria mid morning today, some hemodynamic compromise as evidenced by soft blood pressures with SBP's in the 90s and MAP in the low 60s.  PCCM consulted.  Continuing aggressive management with IV fluid bolus, continued maintenance IV fluids, frequent doses of IM EpiPen.  Multiple labs requested and pending (ANA, anti-DNA antibody double stranded,  CCP, antiscleroderma antibody, ANCA, glomerular basement membrane antibodies, G6 phosphate dehydrogenase, CK, CK-MB, C1 esterase inhibitor, C3 and C4 complement).  PCCM consulted, input appreciated.  Agree with  PCCM regarding transfer to higher level of care/[facility] for evaluation by allergist/dermatologist for expert input and stabilization. No respiratory/airway symptoms or compromise.  Strongly counseled patient and spouse at bedside that she is allergic to all steroids likely and should inform providers going forward so that it is not given to her.  Also recommended outpatient consultation with allergist.  It is possible that since she got IM steroids, hence the reaction is lasting longer than usual.  Patient also made n.p.o. except meds if she further deteriorates and needs intubation etc.  Right upper extremity double-lumen PICC line placed today.  [facility].  Spoke with coordinator Ms. Amy.  Eventually able to speak with both Dr. Lavell LusterAsh Palwai, PCCM and Dr. Gelene MinkFrederick, IM Hospitalist, with all 3 on the conference call.  Discussed in detail regarding the patient situation as noted above, ongoing relative hemodynamic instability and risk for decline.  They have [facility] with option to upgrade to ICU if she worsens.  I discussed in detail with patient, PICC line nurse and patient's ICU nurse via conference call, updated [facility] and patient is agreeable.  Patient's RN will update spouse who was not in the room at that time.   S/p bilateral breast reduction surgery on 3/26 by Dr. Leta Baptisthimmappa: Surgical sites look without acute findings or complications.  Alerted Dr. Leta Baptisthimmappa of the plastic surgery team by sending an in basket message.   IBS/?  GERD: On PPI at home, continue.   Asthma: No clinical bronchospasm.   Anxiety/depression: Resumed home dose of Wellbutrin.   Severe hypoalbuminemia: Unclear etiology.  Outpatient follow-up and evaluation.   Body mass index is 25.11 kg/m.       Consultants:   PCCM/Dr. Marchelle Gearingamaswamy   Procedures:   Right upper arm PICC  line 4/6   Discharge Instructions  Discharge Instructions     Call MD for:   Complete by: As directed    Recurrent skin rash, itching.   Call MD for:  difficulty breathing, headache or visual disturbances   Complete by: As directed    Call MD for:  extreme fatigue   Complete by: As directed    Call MD for:  hives   Complete by: As directed    Call MD for:  persistant dizziness or light-headedness   Complete by: As directed    Call MD for:  persistant nausea and vomiting   Complete by: As directed    Call MD for:  severe uncontrolled pain   Complete by: As directed    Call MD for:  temperature >100.4   Complete by: As directed    Discharge instructions   Complete by: As directed    NPO except meds with sips of water and ice chips.   Increase activity slowly   Complete by: As directed         Medication List     STOP taking these medications    albuterol 108 (90 Base) MCG/ACT inhaler Commonly known as: VENTOLIN HFA Replaced by: albuterol (2.5 MG/3ML) 0.083% nebulizer solution   EPINEPHrine 0.3 mg/0.3 mL Soaj injection Commonly known as: EPI-PEN       TAKE these medications    albuterol (2.5 MG/3ML) 0.083% nebulizer solution Commonly  known as: PROVENTIL Take 3 mLs (2.5 mg total) by nebulization every 4 (four) hours as needed for wheezing or shortness of breath. Replaces: albuterol 108 (90 Base) MCG/ACT inhaler   buPROPion 300 MG 24 hr tablet Commonly known as: WELLBUTRIN XL Take 300 mg by mouth daily.   calamine lotion Apply 1 Application topically as needed for itching.   diphenhydrAMINE 25 mg capsule Commonly known as: BENADRYL Take 1 capsule (25 mg total) by mouth every 6 (six) hours.   diphenhydrAMINE 50 MG/ML injection Commonly known as: BENADRYL Inject 1 mL (50 mg total) into the vein every 6 (six) hours as needed for itching or allergies.   enoxaparin 40 MG/0.4ML injection Commonly known as: LOVENOX Inject 0.4 mLs (40 mg total) into the skin  daily.   EPINEPHrine 1 MG/ML Soln Commonly known as: ADRENALIN Inject 0.5 mLs (0.5 mg total) into the muscle every 10 (ten) minutes as needed (Every 5-15 min for anaphylaxis per rash and bp and shortness of breath and resp status).   famotidine 20-0.9 MG/50ML-% Commonly known as: PEPCID Inject 50 mLs (20 mg total) into the vein every 12 (twelve) hours.   omeprazole 40 MG capsule Commonly known as: PRILOSEC Take 40 mg by mouth daily.   sodium chloride 0.9 % infusion Inject 125 mLs into the vein continuous.       Allergies  Allergen Reactions   Decadron [Dexamethasone] Anaphylaxis   Medrol [Methylprednisolone] Anaphylaxis   Prednisone Hives, Itching, Swelling and Other (See Comments)    Lips and tongue swell     Chlorhexidine       Procedures/Studies: [place] EKG SITE RITE  Result Date: 11/13/2022 If Site Rite image not attached, placement could not be confirmed due to current cardiac rhythm.     Subjective: I had seen the patient earlier this morning when she had pruritus, her urticaria had improved and she had no respiratory symptoms.  I was called to bedside in the midmorning due to recurrence of rash on her neck, lip swelling.  I consulted PCCM and personally revisited patient at bedside.  Discharge Exam:  Vitals:   11/13/22 1230 11/13/22 1300 11/13/22 1320 11/13/22 1330  BP: (!) 101/42 (!) 104/47 (!) 103/39 (!) 105/37  Pulse: (!) 122 (!) 124 (!) 130 (!) 122  Resp: 20 16 (!) 21 (!) 21  Temp:      TempSrc:      SpO2: 100% 100% 100% 100%  Weight:      Height:        General exam: Pleasant young female, moderately built and nourished lying comfortably supine in bed without distress.  Oral mucosa moist. Respiratory system: Clear to auscultation. Respiratory effort normal. Cardiovascular system: S1 & S2 heard, RRR. No JVD, murmurs, rubs, gallops or clicks. No pedal edema. Gastrointestinal system: Abdomen is nondistended, soft and nontender. No organomegaly or masses  felt. Normal bowel sounds heard. Central nervous system: Alert and oriented. No focal neurological deficits. Extremities: Symmetric 5 x 5 power. Skin: Urticarial rash that were noted on patient's smart phone photos had significantly improved this morning compared to prior pictures in patient's smart phone and per EDP note on admission when evaluated this morning.  She had large pruritic rashes mostly on her lower extremities/thighs with central clearing and margin of redness without elevation.  Rashes on her trunk and face had subsided.  No lip or tongue swelling.  Below are pictures from admission on 4/5.  However on reevaluation midmorning, had new rashes on her face, neck and  there was increased swelling on her right thigh rash.  No lip swelling or tongue swelling noted. Psychiatry: Judgement and insight appear normal. Mood & affect appropriate.            The results of significant diagnostics from this hospitalization (including imaging, microbiology, ancillary and laboratory) are listed below for reference.     Microbiology: Recent Results (from the past 240 hour(s))  MRSA Next Gen by PCR, Nasal     Status: None   Collection Time: 11/12/22  5:48 PM   Specimen: Nasal Mucosa; Nasal Swab  Result Value Ref Range Status   MRSA by PCR Next Gen NOT DETECTED NOT DETECTED Final    Comment: (NOTE) The GeneXpert MRSA Assay (FDA approved for NASAL specimens only), is one component of a comprehensive MRSA colonization surveillance program. It is not intended to diagnose MRSA infection nor to guide or monitor treatment for MRSA infections. Test performance is not FDA approved in patients less than 17 years old. Performed at [facility], [address] [place], [place] [address]      Labs: CBC: Recent Labs  Lab 11/12/22 1431 11/12/22 1928 11/13/22 0313  WBC 9.2 8.8 9.9  NEUTROABS 7.0  --   --   HGB 15.2* 14.9 14.2  HCT 44.5 46.1* 42.5  MCV 90.6 94.3 91.6   PLT 553* 457* 474*    Basic Metabolic Panel: Recent Labs  Lab 11/12/22 1431 11/12/22 1928 11/13/22 0313  NA 130*  --  136  K 3.7  --  4.9  CL 100  --  107  CO2 23  --  21*  GLUCOSE 120*  --  125*  BUN 20  --  21*  CREATININE 0.68 0.72 <0.30*  CALCIUM 8.2*  --  7.6*    Liver Function Tests: Recent Labs  Lab 11/12/22 1431 11/13/22 0313  AST 26 22  ALT 15 12  ALKPHOS 52 46  BILITOT 1.2 0.4  PROT 6.4* 4.9*  ALBUMIN 2.9* <1.5*    I have discussed in detail with patient's spouse multiple times including at bedside twice this morning.  Updated care and answered all questions I had alerted him that we were looking to transfer her to [facility] when he was aware.  Time coordinating discharge: 45 minutes  SIGNED:  Marcellus Scott, MD,  FACP, FHM, [facility], [facility], [facility]   [facility] Hospitalist & Physician Advisor [place]     To contact the attending provider between 7A-7P or the covering provider during after hours 7P-7A, please log into the web site [email or web address] and access using universal [place] password for that web site. If you do not have the password, please call the hospital operator.

## 2022-11-13 NOTE — Consult Note (Signed)
NAME:  Krista [name], MRN:  [id number], DOB:  October 30, 1980, LOS: 0 ADMISSION DATE:  11/12/2022, CONSULTATION DATE:  11/13/22 REFERRING MD:  Dr Bennie Pierini, CHIEF COMPLAINT:  Rash    History of Present Illness:-Patient review of the record and prior hospitalist   42 year old who is also status post sleeve gastrectomy female underwent memory reduction on 11/02/2022 for chronic macromastia neck and back pain.  Then on she went for a postop check 11/10/2022.  During this time she noticed an incidental small pinpoint rash around her umbilicus.  She applied some Benadryl cream.  She did not bring it anyone's attention.  Later at home she started developing rash in her lower extremities.  Dr. Leta Baptist apparently prescribed her some prednisone.  [She now recalls back in her teen years she took some steroids and had rash] within a few hours she broke out into more rash.  She took Benadryl for this.  Apparently after that she went to an urgent care and was given Decadron which again in a matter of hour or so made the rash worse.  She then presented to the ER at [facility] on 4//24 because of itching and hives and orthostatic hypotension.  She was dizzy.  Concern was steroid related additional allergic reaction/anaphylaxis.  In the ER she was continued to have up-and-down rash although she did have transient improvements with EpiPen and fluids.  She was finally admitted.  On 11/13/2022 nursing reported continued tachycardia and then later mid morning worsening rash including involvement of the lips but no swollen tongue no swollen uvula no stridor no shortness of breath no wheezing no diaphoresis.  Blood pressure soft but adequate and CCM consulted. Past Medical History:    Last Weight  Most recent update: 11/12/2022  6:07 PM    Weight  56.4 kg (124 lb 5.4 oz)              has a past medical history of Asthma, Complication of anesthesia, DVT (deep venous thrombosis), Family history of adverse reaction to anesthesia,  Obesity, and PONV (postoperative nausea and vomiting).   reports that she has never smoked. She has never used smokeless tobacco.  Past Surgical History:  Procedure Laterality Date   ABDOMINAL HYSTERECTOMY     BREAST REDUCTION SURGERY Bilateral 11/02/2022   Procedure: MAMMARY REDUCTION  (BREAST);  Surgeon: Glenna Fellows, MD;  Location: [facility];  Service: Plastics;  Laterality: Bilateral;   CESAREAN SECTION     CESAREAN SECTION N/A 05/18/2015   Procedure: CESAREAN SECTION;  Surgeon: Harold Hedge, MD;  Location: [facility];  Service: Obstetrics;  Laterality: N/A;  Repeat edc 06/13/15 allergies to codeine, prednisone, DHA supplements   CHOLECYSTECTOMY     KNEE SURGERY      Allergies  Allergen Reactions   Decadron [Dexamethasone] Anaphylaxis   Medrol [Methylprednisolone] Anaphylaxis   Prednisone Hives, Itching, Swelling and Other (See Comments)    Lips and tongue swell     Chlorhexidine     Immunization History  Administered Date(s) Administered   PFIZER Comirnaty(Gray Top)Covid-19 Tri-Sucrose Vaccine 04/03/2020, 04/25/2020, 10/19/2020   Tdap 03/13/2015    Family History  Problem Relation Age of Onset   Hypertension Mother    Diabetes Mother    Hypertension Father    Chromosomal disorder Son        duplication chromosome 5     Current Facility-Administered Medications:    acetaminophen (TYLENOL) tablet 650 mg, 650 mg, Oral, Q6H PRN, Hongalgi, Maximino Greenland, MD   buPROPion (  WELLBUTRIN XL) 24 hr tablet 300 mg, 300 mg, Oral, Daily, Hongalgi, Anand D, MD, 300 mg at 11/13/22 0856   calamine lotion 1 Application, 1 Application, Topical, PRN, Anthoney Harada, NP, 1 Application at 11/13/22 0235   diphenhydrAMINE (BENADRYL) capsule 25 mg, 25 mg, Oral, Q6H, Hongalgi, Anand D, MD, 25 mg at 11/13/22 0857   diphenhydrAMINE (BENADRYL) injection 50 mg, 50 mg, Intravenous, Q6H PRN, Mikeal Hawthorne, Mohammad L, MD, 50 mg at 11/13/22 0726   enoxaparin (LOVENOX) injection 40 mg, 40 mg,  Subcutaneous, Q24H, Garba, Mohammad L, MD, 40 mg at 11/12/22 2206   EPINEPHrine (EPI-PEN) injection 0.3 mg, 0.3 mg, Intramuscular, PRN, Hongalgi, Anand D, MD   famotidine (PEPCID) IVPB 20 mg premix, 20 mg, Intravenous, Q12H, Mikeal Hawthorne, Mohammad L, MD, Stopped at 11/13/22 0929   lactated ringers bolus 1,000 mL, 1,000 mL, Intravenous, Once, Kalman Shan, MD   lactated ringers infusion, , Intravenous, Continuous, Hongalgi, Anand D, MD, Stopped at 11/13/22 1031   ondansetron (ZOFRAN) tablet 4 mg, 4 mg, Oral, Q6H PRN **OR** ondansetron (ZOFRAN) injection 4 mg, 4 mg, Intravenous, Q6H PRN, Mikeal Hawthorne, Mohammad L, MD   Oral care mouth rinse, 15 mL, Mouth Rinse, PRN, Gonfa, Taye T, MD   pantoprazole (PROTONIX) EC tablet 40 mg, 40 mg, Oral, Daily, Hongalgi, Anand D, MD, 40 mg at 11/13/22 0856     Significant Hospital Events:  11/12/2022 - admit /6/24 CCM consult  Interim History / Subjective:   11/13/2022 - seen in [place], ICU bed 1229  Objective   Blood pressure (!) 93/50, pulse (!) 124, temperature 98.3 F (36.8 C), temperature source Oral, resp. rate 18, height 4\' 11"  (1.499 m), weight 56.4 kg, last menstrual period 09/06/2014, SpO2 100 %, unknown if currently breastfeeding.        Intake/Output Summary (Last 24 hours) at 11/13/2022 1112 Last data filed at 11/13/2022 1045 Gross per 24 hour  Intake 2732.52 ml  Output --  Net 2732.52 ml   Filed Weights   11/12/22 1751  Weight: 56.4 kg    Examination: General: Pleasant thin lady.  Lying in bed.  Teary-eyed HENT: Pupils equal and reactive to light conjunctiva normal.  Mild rash around the lower lip on the right side.  No swollen tongue Lungs: Clear to auscultation bilaterally no stridor. Cardiovascular: Tachycardic blood pressure 95 systolic with adequate MAP Abdomen: Soft nontender Extremities: Calamine lotion but threw that he can see subcutaneous rash urticarial type Neuro: Alert and oriented x 3 speech normal GU: Not  examined  Resolved Hospital Problem list   x  Assessment & Plan:    Anaphylaxis is highly likely when any ONE of the following three criteria is fulfilled:  1. Acute onset of an illness (minutes to several hours) with involvement of the skin, mucosal tissue, or both (eg, generalized hives, pruritus or flushing, swollen lips-tongue-uvula)  AND AT LEAST ONE OF THE FOLLOWING: A. Respiratory compromise (eg, dyspnea, wheeze-bronchospasm, stridor, hypoxemia) B. Reduced BP* or associated symptoms of end-organ dysfunction (eg, hypotonia, collapse, syncope, incontinence)  2. TWO OR MORE OF THE FOLLOWING that occur rapidly after exposure to a LIKELY allergen for that patient (minutes to several hours): A. Involvement of the skin mucosal tissue (eg, generalized hives, itch-flush, swollen lips-tongue-uvula) B. Respiratory compromise (eg, dyspnea, wheeze-bronchospasm, stridor, hypoxemia) C. Reduced BP* or associated symptoms (eg, hypotonia, collapse, syncope, incontinence) D. Persistent gastrointestinal symptoms (eg, crampy abdominal pain, vomiting)  3. Reduced BP* after exposure to a KNOWN allergen for that patient (minutes to several hours): A. Infants  and children - Low systolic BP (age-specific)* or greater than 30% decrease in systolic BP B. Adults - Systolic BP of less than 90 mmHg or greater than 30% decrease from that person's baseline     ANAPHYLAXIS  -She meets absence anaphylaxis criteria.  Unclear primary agent which was possibly something.  Something at home.  But clear secondary agent appears to be the steroids in fact different types of steroids including prednisone and Decadron.  Plan  - Place 2 peripheral IVs and also get PICC line -Fluid bolus with normal saline [avoid lactated Ringer per up-to-date] -Epinephrine IM injections 0.5 mg every 5-15-minute as needed with worsening rash or even change in blood pressure.  -Need to be aggressive with epinephrine as needed  injections -Hold off on steroids  -Currently contraindicated -Do other supportive care -Consider allergy consultation through external health systems -Hoping to avoid airway and blood pressure compromise -N.p.o. except meds and clears6   Best practice (daily eval):  Per [facility]    ATTESTATION & SIGNATURE   The patient Krista [name] is critically ill with multiple organ systems failure and requires high complexity decision making for assessment and support, frequent evaluation and titration of therapies, application of advanced monitoring technologies and extensive interpretation of multiple databases.   Critical Care Time devoted to patient care services described in this note is  45  Minutes. This time reflects time of care of this signee Dr Kalman Shan. This critical care time does not reflect procedure time, or teaching time or supervisory time of PA/NP/Med student/Med Resident etc but could involve care discussion time      SIGNATURE    Dr. Kalman Shan, M.D., F.C.C.P,  Pulmonary and Critical Care Medicine Staff Physician, [facility] Director - Interstitial Lung Disease  Program  Pulmonary Fibrosis [facility] at [facility] [place], [place], [address]  NPI Number:  NPI #[id number]  Pager: [phone], If no answer  -> Check AMION or Try [phone] Telephone (clinical office): [phone] Telephone (research): [phone]  11:12 AM 11/13/2022   11/13/2022 11:12 AM    LABS    PULMONARY No results for input(s): "PHART", "PCO2ART", "PO2ART", "HCO3", "TCO2", "O2SAT" in the last 168 hours.  Invalid input(s): "PCO2", "PO2"  CBC Recent Labs  Lab 11/12/22 1431 11/12/22 1928 11/13/22 0313  HGB 15.2* 14.9 14.2  HCT 44.5 46.1* 42.5  WBC 9.2 8.8 9.9  PLT 553* 457* 474*    COAGULATION No results for input(s): "INR" in the last 168 hours.  CARDIAC  No results for input(s): "TROPONINI" in the last 168 hours. No  results for input(s): "PROBNP" in the last 168 hours.  CHEMISTRY Recent Labs  Lab 11/12/22 1431 11/12/22 1928 11/13/22 0313  NA 130*  --  136  K 3.7  --  4.9  CL 100  --  107  CO2 23  --  21*  GLUCOSE 120*  --  125*  BUN 20  --  21*  CREATININE 0.68 0.72 <0.30*  CALCIUM 8.2*  --  7.6*   CrCl cannot be calculated (This lab value cannot be used to calculate CrCl because it is not a number: <0.30).   LIVER Recent Labs  Lab 11/12/22 1431 11/13/22 0313  AST 26 22  ALT 15 12  ALKPHOS 52 46  BILITOT 1.2 0.4  PROT 6.4* 4.9*  ALBUMIN 2.9* <1.5*     INFECTIOUS No results for input(s): "LATICACIDVEN", "PROCALCITON" in the last 168  hours.   ENDOCRINE CBG (last 3)  No results for input(s): "GLUCAP" in the last 72 hours.       IMAGING x48h  - image(s) personally visualized  -   highlighted in bold No results found.

## 2022-11-13 NOTE — Progress Notes (Signed)
PROGRESS NOTE   Krista [name]  TKW:[id number]    DOB: Aug 28, 1980    DOA: 11/12/2022  PCP: Hadley Pen, MD   I have briefly reviewed patients previous medical records in [facility].  Chief Complaint  Patient presents with   Allergic Reaction    Brief Narrative:  42 year old married female with medical history significant for DVT, anxiety, IBS, OAB, morbid obesity s/p gastric sleeve, hysterectomy, urogenital fistula, recent bilateral breast reduction surgery on 3/26 presented to the ED on 11/12/2022 with progressive pleuritic diffuse skin rash after recently receiving steroids.  Started on Medrol dose pack by plastic surgeon on 4/2 which she started taking on 4/3 for a skin rash on the lower abdomen felt to be due to surgical glue allergy, rash progressively got worse, diffuse with whelps.  [facility] where she was diagnosed with urticaria, anaphylactic shock, hypotension with BP of 87/63, proteinuria and orthostatic hypotension.  She was given Decadron IM without improvement in symptoms and presented to the ED on 11/11/2022.  Treated with EpiPen, antihistaminics, IV fluids, Benadryl, improved and was discharged home.  She presented again to the ED on 4/5 with large generalized hives, hypotension when standing, lightheadedness.  Admitted for severe allergic reaction, suspected due to steroids.  Admitted to stepdown.  Slowly improving.   Assessment & Plan:  Principal Problem:   Drug reaction   Severe allergic skin reaction/?  Anaphylaxis: Patient reports history of lip swelling to prednisone while she was in high school.  Rest of history as noted above.  Allergic reaction likely precipitated by steroids, Decadron/Medrol/prednisone now listed as allergies and CHL.  Treating with IV Pepcid twice daily, as needed IV Benadryl, topical calamine lotion.  Slowly improving.  No respiratory/airway symptoms or compromise.  Ongoing soft blood pressures and tachycardia, continue IV  fluids.  Strongly counseled patient and spouse at bedside that she is allergic to all steroids likely and should inform providers going forward so that it is not given to her.  Also recommended outpatient consultation with allergist.  Due to ongoing significant pruritus, will add scheduled Benadryl 25 Mg p.o. every 6 hours in addition to as needed IV Benadryl.  Ordered as needed EpiPen for anaphylactic reaction.  It is possible that since she got IM steroids, the reaction is lasting longer than usual.  S/p bilateral breast reduction surgery on 3/26 by Dr. Leta Baptist: Surgical sites look without acute findings or complications.  Will alert Dr. Leta Baptist or the plastic surgery team.  IBS/?  GERD: On PPI at home, continue.  Asthma: No clinical bronchospasm.  Anxiety/depression: Resumed home dose of Wellbutrin.  Severe hypoalbuminemia: Unclear etiology.  Outpatient follow-up and evaluation.  Body mass index is 25.11 kg/m.   DVT prophylaxis: enoxaparin (LOVENOX) injection 40 mg Start: 11/12/22 2200     Code Status: Full Code:  ACP Documents: None present. Family Communication: Spouse at bedside Disposition:  Status is: Observation The patient will require care spanning > 2 midnights and should be moved to inpatient because: Still with extensive pruritic skin rashes, better than on admission, ongoing soft blood pressures/symptomatic and tachycardic.  On IV meds and IV fluids.     Consultants:   None  Procedures:     Antimicrobials:      Subjective:  Reports that she may be feels 10 to 20% better than on admission.  The wheels have subsided but the general rashes look bigger, especially on her lower extremities.  Lesions on her face and trunk seem  to have improved.  Denies lip or tongue swelling.  Denies difficulty breathing.  Per nursing, dizzy on standing.  Objective:   Vitals:   11/13/22 0600 11/13/22 0626 11/13/22 0744 11/13/22 0800  BP:  (!) 97/50    Pulse: (!) 103 (!)  107 (!) 119 (!) 124  Resp: (!) 22 (!) 21 (!) 30 18  Temp:   98.3 F (36.8 C)   TempSrc:   Oral   SpO2: 100% 100% 100% 100%  Weight:      Height:        General exam: Pleasant young female, moderately built and nourished lying comfortably supine in bed without distress.  Oral mucosa moist. Respiratory system: Clear to auscultation. Respiratory effort normal. Cardiovascular system: S1 & S2 heard, RRR. No JVD, murmurs, rubs, gallops or clicks. No pedal edema. Gastrointestinal system: Abdomen is nondistended, soft and nontender. No organomegaly or masses felt. Normal bowel sounds heard. Central nervous system: Alert and oriented. No focal neurological deficits. Extremities: Symmetric 5 x 5 power. Skin: Wheals that were noted on patient's smart phone photos have resolved.  She now has large pruritic rashes mostly on her lower extremities/thighs with central clearing and margin of redness without elevation.  Rashes on her trunk and face have subsided.  No lip or tongue swelling.  Below are pictures from admission on 4/5. Psychiatry: Judgement and insight appear normal. Mood & affect appropriate.            Data Reviewed:   I have personally reviewed following labs and imaging studies   CBC: Recent Labs  Lab 11/12/22 1431 11/12/22 1928 11/13/22 0313  WBC 9.2 8.8 9.9  NEUTROABS 7.0  --   --   HGB 15.2* 14.9 14.2  HCT 44.5 46.1* 42.5  MCV 90.6 94.3 91.6  PLT 553* 457* 474*    Basic Metabolic Panel: Recent Labs  Lab 11/12/22 1431 11/12/22 1928 11/13/22 0313  NA 130*  --  136  K 3.7  --  4.9  CL 100  --  107  CO2 23  --  21*  GLUCOSE 120*  --  125*  BUN 20  --  21*  CREATININE 0.68 0.72 <0.30*  CALCIUM 8.2*  --  7.6*    Liver Function Tests: Recent Labs  Lab 11/12/22 1431 11/13/22 0313  AST 26 22  ALT 15 12  ALKPHOS 52 46  BILITOT 1.2 0.4  PROT 6.4* 4.9*  ALBUMIN 2.9* <1.5*    CBG: No results for input(s): "GLUCAP" in the last 168  hours.  Microbiology Studies:   Recent Results (from the past 240 hour(s))  MRSA Next Gen by PCR, Nasal     Status: None   Collection Time: 11/12/22  5:48 PM   Specimen: Nasal Mucosa; Nasal Swab  Result Value Ref Range Status   MRSA by PCR Next Gen NOT DETECTED NOT DETECTED Final    Comment: (NOTE) The GeneXpert MRSA Assay (FDA approved for NASAL specimens only), is one component of a comprehensive MRSA colonization surveillance program. It is not intended to diagnose MRSA infection nor to guide or monitor treatment for MRSA infections. Test performance is not FDA approved in patients less than 43 years old. Performed at [facility], [address] [place], [place] [address]     Radiology Studies:  No results found.  Scheduled Meds:    buPROPion  300 mg Oral Daily   Chlorhexidine Gluconate Cloth  6 each Topical Daily   diphenhydrAMINE  25 mg Oral  Q6H   enoxaparin (LOVENOX) injection  40 mg Subcutaneous Q24H   pantoprazole  40 mg Oral Daily    Continuous Infusions:    famotidine (PEPCID) IV Stopped (11/12/22 2236)   lactated ringers       LOS: 0 days     Marcellus ScottAnand Julissa Browning, MD,  FACP, [facility], [facility] RehabilitationFHM, [facility] CenterCMPC, [facility] IncCHCQM-PHYADV   [facility] Physician Advisor [place]     To contact the attending provider between 7A-7P or the covering provider during after hours 7P-7A, please log into the web site [email or web address] and access using universal Marcus password for that web site. If you do not have the password, please call the hospital operator.  11/13/2022, 8:28 AM

## 2022-11-14 LAB — C4 COMPLEMENT: Complement C4, Body Fluid: 19 mg/dL (ref 12–38)

## 2022-11-14 LAB — C3 COMPLEMENT: C3 Complement: 85 mg/dL (ref 82–167)

## 2022-11-15 LAB — GLUCOSE 6 PHOSPHATE DEHYDROGENASE
G6PDH: 9.5 U/g{Hb} (ref 4.7–14.6)
Hemoglobin: 13.9 g/dL (ref 11.1–15.9)

## 2022-11-15 LAB — CYCLIC CITRUL PEPTIDE ANTIBODY, IGG/IGA: CCP Antibodies IgG/IgA: 4 units (ref 0–19)

## 2022-11-15 LAB — C1 ESTERASE INHIBITOR: C1INH SerPl-mCnc: 28 mg/dL (ref 21–39)

## 2022-11-16 DIAGNOSIS — T782XXA Anaphylactic shock, unspecified, initial encounter: Secondary | ICD-10-CM | POA: Diagnosis not present

## 2022-11-16 LAB — ANCA TITERS
Atypical P-ANCA titer: <1:20 {titer}
C-ANCA: <1:20 {titer}
P-ANCA: <1:20 {titer}

## 2022-11-16 LAB — TRYPTASE: Tryptase: 34 ug/L — ABNORMAL HIGH (ref 2.2–13.2)

## 2022-11-17 LAB — ANTI-SCLERODERMA ANTIBODY: Scleroderma (Scl-70) (ENA) Antibody, IgG: <0.2 AI (ref 0.0–0.9)

## 2022-11-17 LAB — ANTI-DNA ANTIBODY, DOUBLE-STRANDED: ds DNA Ab: <1 IU/mL (ref 0–9)

## 2022-11-17 LAB — ANA: Anti Nuclear Antibody (ANA): NEGATIVE

## 2022-11-18 LAB — GLOMERULAR BASEMENT MEMBRANE ANTIBODIES: GBM Ab: <0.2 units (ref 0.0–0.9)

## 2022-12-01 DIAGNOSIS — L509 Urticaria, unspecified: Secondary | ICD-10-CM | POA: Diagnosis not present

## 2022-12-09 ENCOUNTER — Ambulatory Visit (INDEPENDENT_AMBULATORY_CARE_PROVIDER_SITE_OTHER): Payer: 59 | Admitting: Allergy

## 2022-12-09 ENCOUNTER — Encounter: Payer: Self-pay | Admitting: Allergy

## 2022-12-09 ENCOUNTER — Other Ambulatory Visit: Payer: Self-pay

## 2022-12-09 VITALS — BP 108/70 | HR 81 | Temp 98.3°F | Resp 20 | Ht <= 58 in | Wt 115.6 lb

## 2022-12-09 DIAGNOSIS — Z889 Allergy status to unspecified drugs, medicaments and biological substances status: Secondary | ICD-10-CM | POA: Diagnosis not present

## 2022-12-09 DIAGNOSIS — L299 Pruritus, unspecified: Secondary | ICD-10-CM | POA: Diagnosis not present

## 2022-12-09 DIAGNOSIS — T782XXD Anaphylactic shock, unspecified, subsequent encounter: Secondary | ICD-10-CM

## 2022-12-09 DIAGNOSIS — L509 Urticaria, unspecified: Secondary | ICD-10-CM | POA: Diagnosis not present

## 2022-12-09 MED ORDER — FAMOTIDINE 20 MG PO TABS: 20.0000 mg | ORAL_TABLET | Freq: Two times a day (BID) | ORAL | 5 refills | Status: DC

## 2022-12-09 MED ORDER — LEVOCETIRIZINE DIHYDROCHLORIDE 5 MG PO TABS: 5.0000 mg | ORAL_TABLET | Freq: Two times a day (BID) | ORAL | 5 refills | Status: DC

## 2022-12-09 MED ORDER — MONTELUKAST SODIUM 10 MG PO TABS: 10.0000 mg | ORAL_TABLET | Freq: Every day | ORAL | 5 refills | Status: DC

## 2022-12-09 NOTE — Progress Notes (Signed)
New Patient Note  RE: Krista [name] MRN: [id number] DOB: 1980-09-26 Date of Office Visit: 12/09/2022  Primary care provider: Hadley Pen, MD  Chief Complaint: hives  History of present illness: Krista [name] is a 42 y.o. female presenting today for evaluation of hives.   She has a breast reduction in March.  After this she developed hives about a week later.  She states the day the hives started she had gone to have her drains taken out.  She has history of a c-section with vertical scar and reports having intermittent episodes where the scar can get red and itchy.  She states this particular day the after getting her drains taken out the C-section scar was very itchy.  She states she used a steroid cream like clobetosol on the c-section area and the area started to get worse and worse.  The itching and redness started to spread out over the lower abdomen.  She developed hives and redness on the adbomen that kept spreading.   She called her surgeon as the rash continued to spread and she was prescribed a medrol dosepak.    She does report having a history of allergy to prednisone.  She states in highschool when she had taking prednisone she developed lip and tongue swelling.   She started the Medrol Dosepak the next morning and took only 2 tabs and she started then to feel bad and felt "foggy' and then she developed hives all over the body.  She went to UC and states she almost passed out in the lobby.  She states her BP 81/63 at UC.  She was given OJ and had her BS checked.  She also had orthostatic BPs checked.  It was decided to give her a steroid injection (decadron) and was advised to go to the ED.   She was driven by family member to the ED.  After receiving the steroid injection she states she felt weak and "foggy".  In the ED she states she was covered in large hives and she received epi IM and was observed for a period of time and then was discharged.  The next day she had to go  back to the ED as the hives were wrosening again and she felt dizzy and still feeling bad.  She was admitted to the hospital and then transferred to [facility] for anaphylaxis.  During the admission she received epinephrine, antihistamines (claritin bid), IV fluids, and Benadryl. She was able to discharged with AI evaluation.  Since her discharge she states she has been taking Claritin and Pepcid twice a day.  She is still having some hives on her lower extremities.  Review of systems: Review of Systems  Constitutional:        See HPI  HENT: Negative.    Eyes: Negative.   Respiratory: Negative.    Cardiovascular: Negative.   Gastrointestinal: Negative.   Musculoskeletal: Negative.   Skin:  Positive for rash.       See HPI  Allergic/Immunologic: Negative.   Neurological: Negative.     All other systems negative unless noted above in HPI  Past medical history: Past Medical History:  Diagnosis Date   Asthma    exercise induced   Complication of anesthesia    DVT (deep venous thrombosis) (HCC)    Family history of adverse reaction to anesthesia    PONV   Obesity    PONV (postoperative nausea and vomiting)    Urticaria  Past surgical history: Past Surgical History:  Procedure Laterality Date   ABDOMINAL HYSTERECTOMY     BREAST REDUCTION SURGERY Bilateral 11/02/2022   Procedure: MAMMARY REDUCTION  (BREAST);  Surgeon: Glenna Fellows, MD;  Location: [facility];  Service: Plastics;  Laterality: Bilateral;   CESAREAN SECTION     CESAREAN SECTION N/A 05/18/2015   Procedure: CESAREAN SECTION;  Surgeon: Harold Hedge, MD;  Location: [facility];  Service: Obstetrics;  Laterality: N/A;  Repeat edc 06/13/15 allergies to codeine, prednisone, DHA supplements   CHOLECYSTECTOMY     GASTRIC BYPASS  06/15/2021   sleeve   KNEE SURGERY      Family history:  Family History  Problem Relation Age of Onset   Hypertension Mother    Diabetes Mother    Hypertension Father     Chromosomal disorder Son        duplication chromosome 5    Social history: Lives in a home without carpeting with electric heating and central cooling.  Dog in the home.  There is no concern for water damage, mildew or roaches in the home.  She is a Scientist, physiological.  She does not report a smoking history.   Medication List: Current Outpatient Medications  Medication Sig Dispense Refill   buPROPion (WELLBUTRIN XL) 300 MG 24 hr tablet Take 300 mg by mouth daily.     diphenhydrAMINE (BENADRYL) 25 mg capsule Take 1 capsule (25 mg total) by mouth every 6 (six) hours.     EPINEPHrine (EPIPEN 2-PAK) 0.3 mg/0.3 mL IJ SOAJ injection Inject 0.3 mg into the muscle as needed for anaphylaxis.     famotidine (PEPCID) 20-0.9 MG/50ML-% Inject 50 mLs (20 mg total) into the vein every 12 (twelve) hours.     omeprazole (PRILOSEC) 40 MG capsule Take 40 mg by mouth daily.     albuterol (PROVENTIL) (2.5 MG/3ML) 0.083% nebulizer solution Take 3 mLs (2.5 mg total) by nebulization every 4 (four) hours as needed for wheezing or shortness of breath. (Patient not taking: Reported on 12/09/2022)     calamine lotion Apply 1 Application topically as needed for itching. (Patient not taking: Reported on 12/09/2022)     famotidine (PEPCID) 20 MG tablet Take 1 tablet (20 mg total) by mouth 2 (two) times daily. 60 tablet 5   levocetirizine (XYZAL) 5 MG tablet Take 1 tablet (5 mg total) by mouth 2 (two) times daily. 30 tablet 5   montelukast (SINGULAIR) 10 MG tablet Take 1 tablet (10 mg total) by mouth at bedtime. 30 tablet 5   No current facility-administered medications for this visit.    Known medication allergies: Allergies  Allergen Reactions   Decadron [Dexamethasone] Anaphylaxis   Medrol [Methylprednisolone] Anaphylaxis   Prednisone Hives, Itching, Swelling and Other (See Comments)    Lips and tongue swell     Chlorhexidine      Physical examination: Blood pressure 108/70, pulse 81, temperature 98.3 F (36.8 C),  temperature source Axillary, resp. rate 20, height 4' 9.28" (1.455 m), weight 115 lb 9.6 oz (52.4 kg), last menstrual period 09/06/2014, SpO2 100 %, unknown if currently breastfeeding.  General: Alert, interactive, in no acute distress. HEENT: PERRLA, TMs pearly gray, turbinates non-edematous without discharge, post-pharynx non erythematous. Neck: Supple without lymphadenopathy. Lungs: Clear to auscultation without wheezing, rhonchi or rales. {no increased work of breathing. CV: Normal S1, S2 without murmurs. Abdomen: Nondistended, nontender. Skin: Scattered erythematous urticarial type lesions primarily located posterior thigh bilaterally , nonvesicular. Extremities:  No clubbing, cyanosis or edema. Neuro:  Grossly intact.  Diagnositics/Labs: Labs:  From 11/13/22 Sodium 136 - 145 mmol/L 138  Potassium 3.5 - 5.1 mmol/L 3.9  Comment: NO VISIBLE HEMOLYSIS  Chloride 98 - 107 mmol/L 105  CO2 21 - 31 mmol/L 28  Anion Gap 6 - 14 mmol/L 5 Low   Glucose, Random 70 - 99 mg/dL 85  Blood Urea Nitrogen (BUN) 7 - 25 mg/dL 16  Creatinine 1.61 - 0.96 mg/dL 0.45 Low   eGFR >40 JW/JXB/1.47W2 >90  Comment: GFR estimated by CKD-EPI equations(NKF 2021).  "Recommend confirmation of Cr-based eGFR by using Cys-based eGFR and other filtration markers (if applicable) in complex cases and clinical decision-making, as needed."  Calcium 8.6 - 10.3 mg/dL 7.0 Low   BUN/Creatinine Ratio 10.0 - 20.0 40.0 High    WBC 4.40 - 11.00 10*3/uL 5.70  RBC 4.10 - 5.10 10*6/uL 3.03 Low   Hemoglobin 12.3 - 15.3 g/dL 9.2 Low   Hematocrit 95.6 - 44.6 % 27.7 Low   Mean Corpuscular Volume (MCV) 80.0 - 96.0 fL 91.3  Mean Corpuscular Hemoglobin (MCH) 27.5 - 33.2 pg 30.3  Mean Corpuscular Hemoglobin Conc (MCHC) 33.0 - 37.0 g/dL 21.3  Red Cell Distribution Width (RDW) 12.3 - 17.0 % 13.7  Platelet Count (PLT) 150 - 450 10*3/uL 278  Mean Platelet Volume (MPV) 6.8 - 10.2 fL 6.0 Low    From 11/15/22 WBC 4.40 -  11.00 10*3/uL 5.90  RBC 4.10 - 5.10 10*6/uL 3.30 Low   Hemoglobin 12.3 - 15.3 g/dL 08.6 Low   Hematocrit 57.8 - 44.6 % 30.9 Low   Mean Corpuscular Volume (MCV) 80.0 - 96.0 fL 93.8  Mean Corpuscular Hemoglobin (MCH) 27.5 - 33.2 pg 31.2  Mean Corpuscular Hemoglobin Conc (MCHC) 33.0 - 37.0 g/dL 46.9  Red Cell Distribution Width (RDW) 12.3 - 17.0 % 13.6  Platelet Count (PLT) 150 - 450 10*3/uL 317  Mean Platelet Volume (MPV) 6.8 - 10.2 fL 6.3 Low    Component     Latest Ref Rng 11/13/2022  Cytoplasmic (C-ANCA)     Neg:<1:20 titer <1:20   P-ANCA     Neg:<1:20 titer <1:20   Atypical P-ANCA titer     Neg:<1:20 titer <1:20   CK Total     38 - 234 U/L 13 (L)   CK, MB     0.5 - 5.0 ng/mL 2.7   Hemoglobin     11.1 - 15.9 g/dL 62.9   B2WUX     4.7 - 14.6 U/g Hb 9.5   Tryptase     2.2 - 13.2 ug/L 34.0 (H)   Anti Nuclear Antibody (ANA)     Negative  Negative   ds DNA Ab     0 - 9 IU/mL <1   CCP Antibodies IgG/IgA     0 - 19 units 4   Scleroderma (Scl-70) (ENA) Antibody, IgG     0.0 - 0.9 AI <0.2   GBM Ab     0.0 - 0.9 units <0.2   Lactic Acid, Venous     0.5 - 1.9 mmol/L 1.0   C1INH SerPl-mCnc     21 - 39 mg/dL 28   C3 Complement     82 - 167 mg/dL 85   Complement C4, Body Fluid     12 - 38 mg/dL 19      Assessment and plan: Anaphylaxis Steroid medication allergy Urticaria with pruritus  - for continued hive control recommend the following high-dose antihistamine regimen:    - Xyzal 5mg  1 tab twice  a day.   This is an H1 blocker to replace Claritin.     - Pepcid 40mg  daily (20mg  twice a day).  This is an H2 blocker.     - Singulair 10mg  daily at bedtime.  This is an antileukotriene.  - Xolair will be considered if high-dose antihistamine regimen is not effective in controlling hives.  Will discuss this in more detail if needed in future - continue avoidance of steroid medications  - have access to self-injectable epinephrine (Epipen or AuviQ) 0.3mg  at all times -  follow emergency action plan in case of allergic reaction  - patch testing is a possible test option that does include several types of topical steroids.   Patch testing with the TRUE test patch panels.  Patches are best placed on a Monday with return to office on Wednesday and Friday of same week for readings.  Once patches are in place to do not get them wet.  You can take antihistamines while patches are in place.  - I am going to look through the literature to see what other steroid testing options may be recommended.  - you have had reaction symptoms related to group 1 class and group 3 class of steroids.  Thus it may be possible that you tolerate steroids in the group 2 class.  This is what would need to be tested prior to possible use.   - would not do any testing until at least 6-8 weeks on from initial reaction  Follow-up in 3-4 months or sooner if needed  I appreciate the opportunity to take part in Berania's care. Please do not hesitate to contact me with questions.  Sincerely,   Margo Aye, MD Allergy/Immunology [facility]

## 2022-12-09 NOTE — Patient Instructions (Addendum)
-   for continued hive control recommend the following high-dose antihistamine regimen:    - Xyzal 5mg  1 tab twice a day.   This is an H1 blocker to replace Claritin.     - Pepcid 40mg  daily (20mg  twice a day).  This is an H2 blocker.     - Singulair 10mg  daily at bedtime.  This is an antileukotriene.  - Xolair will be considered if high-dose antihistamine regimen is not effective in controlling hives.  Will discuss this in more detail if needed in future - continue avoidance of steroid medications  - have access to self-injectable epinephrine (Epipen or AuviQ) 0.3mg  at all times - follow emergency action plan in case of allergic reaction  - patch testing is a possible test option that does include several types of topical steroids.   Patch testing with the TRUE test patch panels.  Patches are best placed on a Monday with return to office on Wednesday and Friday of same week for readings.  Once patches are in place to do not get them wet.  You can take antihistamines while patches are in place.  - I am going to look through the literature to see what other steroid testing options may be recommended.  - you have had reaction symptoms related to group 1 class and group 3 class of steroids.  Thus it may be possible that you tolerate steroids in the group 2 class.  This is what would need to be tested prior to possible use.   - would not do any testing until at least 6-8 weeks on from initial reaction  True Test looks for the following sensitivities:       Follow-up in 3-4 months or sooner if needed

## 2022-12-10 ENCOUNTER — Encounter: Payer: Self-pay | Admitting: Allergy

## 2022-12-10 ENCOUNTER — Telehealth: Payer: Self-pay

## 2022-12-10 LAB — CBC WITH DIFFERENTIAL
Immature Granulocytes: 0 %
Neutrophils: 42 %
RBC: 3.97 x10E6/uL (ref 3.77–5.28)
RDW: 13.1 % (ref 11.7–15.4)
WBC: 5.7 10*3/uL (ref 3.4–10.8)

## 2022-12-10 LAB — ALLERGENS W/TOTAL IGE AREA 2

## 2022-12-10 LAB — ALPHA-GAL PANEL

## 2022-12-10 LAB — COMPREHENSIVE METABOLIC PANEL
Globulin, Total: 2.7 g/dL (ref 1.5–4.5)
Glucose: 86 mg/dL (ref 70–99)

## 2022-12-10 LAB — TSH: TSH: 1.25 u[IU]/mL (ref 0.450–4.500)

## 2022-12-10 MED ORDER — FAMOTIDINE 20 MG PO TABS: 20.0000 mg | ORAL_TABLET | Freq: Two times a day (BID) | ORAL | 5 refills | Status: DC

## 2022-12-10 MED ORDER — MONTELUKAST SODIUM 10 MG PO TABS: 10.0000 mg | ORAL_TABLET | Freq: Every day | ORAL | 5 refills | Status: DC

## 2022-12-10 MED ORDER — LEVOCETIRIZINE DIHYDROCHLORIDE 5 MG PO TABS: 5.0000 mg | ORAL_TABLET | Freq: Two times a day (BID) | ORAL | 5 refills | Status: DC

## 2022-12-10 NOTE — Telephone Encounter (Signed)
PA request received via CMM for Xyzal Allergy 24HR 5MG  tablets  PA has been submitted to [employer] and is pending determination  Key: [id number]

## 2022-12-11 LAB — COMPREHENSIVE METABOLIC PANEL
ALT: 11 IU/L (ref 0–32)
Albumin/Globulin Ratio: 1.6 (ref 1.2–2.2)
Albumin: 4.2 g/dL (ref 3.9–4.9)
BUN/Creatinine Ratio: 25 — ABNORMAL HIGH (ref 9–23)

## 2022-12-11 LAB — CBC WITH DIFFERENTIAL
Eos: 3 %
Hematocrit: 38 % (ref 34.0–46.6)
Hemoglobin: 12.3 g/dL (ref 11.1–15.9)
Lymphocytes Absolute: 2.8 10*3/uL (ref 0.7–3.1)

## 2022-12-11 LAB — ALLERGENS W/TOTAL IGE AREA 2

## 2022-12-11 LAB — ALPHA-GAL PANEL

## 2022-12-11 LAB — THYROID ANTIBODIES: Thyroglobulin Antibody: <1 IU/mL (ref 0.0–0.9)

## 2022-12-12 LAB — COMPREHENSIVE METABOLIC PANEL
BUN: 17 mg/dL (ref 6–24)
Bilirubin Total: 0.3 mg/dL (ref 0.0–1.2)
CO2: 18 mmol/L — ABNORMAL LOW (ref 20–29)
Calcium: 9.4 mg/dL (ref 8.7–10.2)
Creatinine, Ser: 0.68 mg/dL (ref 0.57–1.00)

## 2022-12-12 LAB — CBC WITH DIFFERENTIAL
EOS (ABSOLUTE): 0.2 10*3/uL (ref 0.0–0.4)
MCV: 96 fL (ref 79–97)
Monocytes: 6 %
Neutrophils Absolute: 2.4 10*3/uL (ref 1.4–7.0)

## 2022-12-12 LAB — ALLERGENS W/TOTAL IGE AREA 2

## 2022-12-12 LAB — THYROID ANTIBODIES: Thyroperoxidase Ab SerPl-aCnc: <9 IU/mL (ref 0–34)

## 2022-12-12 LAB — TRYPTASE: Tryptase: 6.3 ug/L (ref 2.2–13.2)

## 2022-12-13 ENCOUNTER — Other Ambulatory Visit (HOSPITAL_COMMUNITY): Payer: Self-pay

## 2022-12-13 NOTE — Telephone Encounter (Signed)
Patient Advocate Encounter  Received a fax from [employer] regarding Prior Authorization for Xyzal Allergy 24HR 5MG  tablets.   Authorization has been DENIED due to

## 2022-12-14 LAB — ALLERGENS W/TOTAL IGE AREA 2
Cedar, Mountain IgE: <0.1 kU/L
Pecan, Hickory IgE: 0.84 kU/L — AB
White Mulberry IgE: <0.1 kU/L

## 2022-12-14 LAB — COMPREHENSIVE METABOLIC PANEL
Chloride: 105 mmol/L (ref 96–106)
Potassium: 4.7 mmol/L (ref 3.5–5.2)
Sodium: 144 mmol/L (ref 134–144)
eGFR: 111 mL/min/{1.73_m2} (ref >59)

## 2022-12-14 LAB — CBC WITH DIFFERENTIAL
Immature Grans (Abs): 0 10*3/uL (ref 0.0–0.1)
MCH: 31 pg (ref 26.6–33.0)

## 2022-12-14 NOTE — Telephone Encounter (Signed)
Called and informed patient of the denial and informed her of Dr. Randell Patient note. Patient expressed that she picked up the Xyzal with a zero dollar co pay as she has [employer] insurance.

## 2022-12-14 NOTE — Telephone Encounter (Signed)
Forwarding message below to provider for next step

## 2022-12-14 NOTE — Telephone Encounter (Signed)
Called and left a voicemail asking for patient to return call to inform.  

## 2022-12-16 DIAGNOSIS — Z7182 Exercise counseling: Secondary | ICD-10-CM | POA: Diagnosis not present

## 2022-12-16 DIAGNOSIS — Z713 Dietary counseling and surveillance: Secondary | ICD-10-CM | POA: Diagnosis not present

## 2022-12-16 DIAGNOSIS — Z903 Acquired absence of stomach [part of]: Secondary | ICD-10-CM | POA: Diagnosis not present

## 2022-12-16 NOTE — Progress Notes (Signed)
[name] [address] [place] [address] Phone: [phone] Subjective:   Krista [name], am serving as a scribe for Dr. Antoine [name].  I'm seeing this patient by the request  of:  Krista Pen, MD  CC: Back and neck pain follow-up  [id number]  Krista [name] is a 42 y.o. female coming in with complaint of back and neck pain. OMT 10/27/2022. Patient states that she is doing better post surgery.  Patient did have a anaphylactic episode and was in the hospital for 5 days.  Patient states that the bed and seem to exacerbate some of her discomfort and pain.  Medications patient has been prescribed: None  Taking:         Reviewed prior external information including notes and imaging from previsou exam, outside providers and external EMR if available.   As well as notes that were available from care everywhere and other healthcare systems.  Past medical history, social, surgical and family history all reviewed in electronic medical record.  No pertanent information unless stated regarding to the chief complaint.   Past Medical History:  Diagnosis Date   Asthma    exercise induced   Complication of anesthesia    DVT (deep venous thrombosis) (HCC)    Family history of adverse reaction to anesthesia    PONV   Obesity    PONV (postoperative nausea and vomiting)    Urticaria     Allergies  Allergen Reactions   Decadron [Dexamethasone] Anaphylaxis   Medrol [Methylprednisolone] Anaphylaxis   Prednisone Hives, Itching, Swelling and Other (See Comments)    Lips and tongue swell     Chlorhexidine      Review of Systems:  No headache, visual changes, nausea, vomiting, diarrhea, constipation, dizziness, abdominal pain, skin rash, fevers, chills, night sweats, weight loss, swollen lymph nodes, body aches, joint swelling, chest pain, shortness of breath, mood changes. POSITIVE muscle aches  Objective  Blood pressure 108/72,  pulse 70, height 4\' 9"  (1.448 m), weight 119 lb (54 kg), last menstrual period 09/06/2014, SpO2 98 %, unknown if currently breastfeeding.   General: No apparent distress alert and oriented x3 mood and affect normal, dressed appropriately.  HEENT: Pupils equal, extraocular movements intact  Respiratory: Patient's speak in full sentences and does not appear short of breath  Cardiovascular: No lower extremity edema, non tender, no erythema  Patient does have some limited sidebending of the neck bilaterally.  Still scapular dyskinesis noted right greater than left.  Tightness with FABER test right greater than left.  Osteopathic findings  C6 flexed rotated and side bent right T3 extended rotated and side bent right inhaled rib L2 flexed rotated and side bent right L5 flexed rotated and side bent left Sacrum right on right       Assessment and Plan:  Scapular dyskinesis Scapular dyskinesis noted.  Discussed posture and ergonomics, discussed which activities to do and which ones to avoid, increase activity slowly over the course of next several weeks.  Patient did have the breast reduction surgery and I do think that this is going to help more long-term.  Discussed increasing activity where tolerated and work on Financial controller.  Follow-up again in 6 to 8 weeks    Nonallopathic problems  Decision today to treat with OMT was based on Physical Exam  After verbal consent patient was treated with HVLA, ME, FPR techniques in cervical, rib, thoracic, lumbar, and sacral  areas  Patient tolerated the procedure well  with improvement in symptoms  Patient given exercises, stretches and lifestyle modifications  See medications in patient instructions if given  Patient will follow up in 4-8 weeks             Note: This dictation was prepared with Dragon dictation along with smaller phrase technology. Any transcriptional errors that result from this process are unintentional.

## 2022-12-21 ENCOUNTER — Encounter: Payer: Self-pay | Admitting: Allergy

## 2022-12-21 LAB — CHRONIC URTICARIA: cu index: 2.1 (ref <10)

## 2022-12-21 LAB — ALPHA-GAL PANEL
Allergen Lamb IgE: <0.1 kU/L
O215-IgE Alpha-Gal: <0.1 kU/L

## 2022-12-21 LAB — CBC WITH DIFFERENTIAL
Basophils Absolute: 0 10*3/uL (ref 0.0–0.2)
Basos: 0 %
Lymphs: 49 %
MCHC: 32.4 g/dL (ref 31.5–35.7)
Monocytes Absolute: 0.4 10*3/uL (ref 0.1–0.9)

## 2022-12-21 LAB — ALLERGENS W/TOTAL IGE AREA 2
Cladosporium Herbarum IgE: <0.1 kU/L
Cottonwood IgE: <0.1 kU/L
Elm, American IgE: 0.1 kU/L — AB
Mouse Urine IgE: <0.1 kU/L

## 2022-12-21 LAB — COMPREHENSIVE METABOLIC PANEL
AST: 19 IU/L (ref 0–40)
Alkaline Phosphatase: 71 IU/L (ref 44–121)
Total Protein: 6.9 g/dL (ref 6.0–8.5)

## 2022-12-22 ENCOUNTER — Encounter: Payer: Self-pay | Admitting: Family Medicine

## 2022-12-22 ENCOUNTER — Ambulatory Visit (INDEPENDENT_AMBULATORY_CARE_PROVIDER_SITE_OTHER): Payer: 59 | Admitting: Allergy

## 2022-12-22 ENCOUNTER — Other Ambulatory Visit: Payer: Self-pay

## 2022-12-22 ENCOUNTER — Ambulatory Visit (INDEPENDENT_AMBULATORY_CARE_PROVIDER_SITE_OTHER): Payer: 59 | Admitting: Family Medicine

## 2022-12-22 ENCOUNTER — Encounter: Payer: Self-pay | Admitting: Allergy

## 2022-12-22 VITALS — BP 108/72 | HR 70 | Ht <= 58 in | Wt 119.0 lb

## 2022-12-22 VITALS — BP 104/70 | HR 63 | Temp 98.7°F | Resp 16 | Wt 119.4 lb

## 2022-12-22 DIAGNOSIS — M9903 Segmental and somatic dysfunction of lumbar region: Secondary | ICD-10-CM

## 2022-12-22 DIAGNOSIS — J3089 Other allergic rhinitis: Secondary | ICD-10-CM | POA: Diagnosis not present

## 2022-12-22 DIAGNOSIS — M9902 Segmental and somatic dysfunction of thoracic region: Secondary | ICD-10-CM

## 2022-12-22 DIAGNOSIS — T782XXD Anaphylactic shock, unspecified, subsequent encounter: Secondary | ICD-10-CM | POA: Diagnosis not present

## 2022-12-22 DIAGNOSIS — M9904 Segmental and somatic dysfunction of sacral region: Secondary | ICD-10-CM

## 2022-12-22 DIAGNOSIS — L501 Idiopathic urticaria: Secondary | ICD-10-CM | POA: Diagnosis not present

## 2022-12-22 DIAGNOSIS — M9908 Segmental and somatic dysfunction of rib cage: Secondary | ICD-10-CM

## 2022-12-22 DIAGNOSIS — L509 Urticaria, unspecified: Secondary | ICD-10-CM

## 2022-12-22 DIAGNOSIS — M9901 Segmental and somatic dysfunction of cervical region: Secondary | ICD-10-CM

## 2022-12-22 DIAGNOSIS — G2589 Other specified extrapyramidal and movement disorders: Secondary | ICD-10-CM

## 2022-12-22 MED ORDER — EUCRISA 2 % EX OINT: 1.0000 | TOPICAL_OINTMENT | Freq: Two times a day (BID) | CUTANEOUS | 1 refills | Status: AC | PRN

## 2022-12-22 MED ORDER — FEXOFENADINE HCL 180 MG PO TABS: ORAL_TABLET | ORAL | 3 refills | Status: DC

## 2022-12-22 NOTE — Assessment & Plan Note (Signed)
Scapular dyskinesis noted.  Discussed posture and ergonomics, discussed which activities to do and which ones to avoid, increase activity slowly over the course of next several weeks.  Patient did have the breast reduction surgery and I do think that this is going to help more long-term.  Discussed increasing activity where tolerated and work on Financial controller.  Follow-up again in 6 to 8 weeks

## 2022-12-22 NOTE — Patient Instructions (Addendum)
Hives: Start allegra (fexofenadine) 360mg  which is 2 tablets twice a day. OR you can try to take Xyzal (levocetirizine) 10mg  (2 tablets) twice a day.  If symptoms are not controlled or causes drowsiness let us know. Continue Pepcid (famotidine) 20mg  twice a day.  Continue Singulair (montelukast) 10mg  daily at night. Read about Xolair injections - 2 injections every 4 weeks. Tammy will be in touch with you regarding this.  Avoid the following potential triggers: alcohol, tight clothing, NSAIDs, hot showers and getting overheated. See below for proper skin care.  May use topical benadryl cream prn itching. Use Eucrisa (crisaborole) 2% ointment twice a day on mild rash flares on the face and body. This is a non-steroid ointment.   Environmental allergies See below for environmental control measures.   Follow up in 1 months or sooner if needed with Dr. Delorse Lek.   Skin care recommendations  Bath time: Always use lukewarm water. AVOID very hot or cold water. Keep bathing time to 5-10 minutes. Do NOT use bubble bath. Use a mild soap and use just enough to wash the dirty areas. Do NOT scrub skin vigorously.  After bathing, pat dry your skin with a towel. Do NOT rub or scrub the skin.  Moisturizers and prescriptions:  ALWAYS apply moisturizers immediately after bathing (within 3 minutes). This helps to lock-in moisture. Use the moisturizer several times a day over the whole body. Good summer moisturizers include: Aveeno, CeraVe, Cetaphil. Good winter moisturizers include: Aquaphor, Vaseline, Cerave, Cetaphil, Eucerin, Vanicream. When using moisturizers along with medications, the moisturizer should be applied about one hour after applying the medication to prevent diluting effect of the medication or moisturize around where you applied the medications. When not using medications, the moisturizer can be continued twice daily as maintenance.  Laundry and clothing: Avoid laundry products with  added color or perfumes. Use unscented hypo-allergenic laundry products such as Tide free, Cheer free & gentle, and All free and clear.  If the skin still seems dry or sensitive, you can try double-rinsing the clothes. Avoid tight or scratchy clothing such as wool. Do not use fabric softeners or dyer sheets.   Pet Allergen Avoidance: Contrary to popular opinion, there are no "hypoallergenic" breeds of dogs or cats. That is because people are not allergic to an animal's hair, but to an allergen found in the animal's saliva, dander (dead skin flakes) or urine. Pet allergy symptoms typically occur within minutes. For some people, symptoms can build up and become most severe 8 to 12 hours after contact with the animal. People with severe allergies can experience reactions in public places if dander has been transported on the pet owners' clothing. Keeping an animal outdoors is only a partial solution, since homes with pets in the yard still have higher concentrations of animal allergens. Before getting a pet, ask your allergist to determine if you are allergic to animals. If your pet is already considered part of your family, try to minimize contact and keep the pet out of the bedroom and other rooms where you spend a great deal of time. As with dust mites, vacuum carpets often or replace carpet with a hardwood floor, tile or linoleum. High-efficiency particulate air (HEPA) cleaners can reduce allergen levels over time. While dander and saliva are the source of cat and dog allergens, urine is the source of allergens from rabbits, hamsters, mice and [place] pigs; so ask a non-allergic family member to clean the animal's cage. If you have a pet allergy, talk to  your allergist about the potential for allergy immunotherapy (allergy shots). This strategy can often provide long-term relief.  Reducing Pollen Exposure Pollen seasons: trees (spring), grass (summer) and ragweed/weeds (fall). Keep windows closed in  your home and car to lower pollen exposure.  Install air conditioning in the bedroom and throughout the house if possible.  Avoid going out in dry windy days - especially early morning. Pollen counts are highest between 5 - 10 AM and on dry, hot and windy days.  Save outside activities for late afternoon or after a heavy rain, when pollen levels are lower.  Avoid mowing of grass if you have grass pollen allergy. Be aware that pollen can also be transported indoors on people and pets.  Dry your clothes in an automatic dryer rather than hanging them outside where they might collect pollen.  Rinse hair and eyes before bedtime.

## 2022-12-22 NOTE — Assessment & Plan Note (Signed)
2024 bloodwork positive to dog, grass, trees and ragweed. See below for environmental control measures.

## 2022-12-22 NOTE — Assessment & Plan Note (Signed)
Flared again despite taking Xyzal 5mg  BID, famotidine 20mg  BID and Singulair 10mg  daily. Initially had benefit when switched from Claritin. Allergic to steroids. Start allegra (fexofenadine) 360mg  which is 2 tablets twice a day. OR take Xyzal (levocetirizine) 10mg  (2 tablets) twice a day.  If symptoms are not controlled or causes drowsiness let us know. Continue Pepcid (famotidine) 20mg  twice a day.  Continue Singulair (montelukast) 10mg  daily at night. Read about Xolair injections - 300mg  every 4 weeks. Start PA process.  Avoid the following potential triggers: alcohol, tight clothing, NSAIDs, hot showers and getting overheated. See below for proper skin care.  May use topical benadryl cream prn itching. Use Eucrisa (crisaborole) 2% ointment twice a day on mild rash flares on the face and body. This is a non-steroid ointment.

## 2022-12-22 NOTE — Patient Instructions (Signed)
Great to see you alive See me in 2-3 months

## 2022-12-22 NOTE — Assessment & Plan Note (Addendum)
Most likely allergic to steroids. Continue to avoid for now in all forms - oral, IM, IV, topical.

## 2022-12-22 NOTE — Progress Notes (Signed)
Follow Up Note  RE: Krista [name] MRN: [id number] DOB: Dec 10, 1980 Date of Office Visit: 12/22/2022  Referring provider: Hadley Pen, MD Primary care provider: Hadley Pen, MD  Chief Complaint: Urticaria (Good for two to three days with medication change but the hives are returning all over.)  History of Present Illness: I had the pleasure of seeing Krista [name] for a follow up visit at the [facility] on 12/22/2022. She is a 42 y.o. female, who is being followed for urticaria, angioedema and allergic reaction. Her previous allergy office visit was on 12/09/2022 with Dr. Delorse Lek. Today is a new complaint visit of hive outbreak .  Hives Currently taking Xyzal 5mg  1 tablet BID, famotidine 20mg  BID, Singulair 10mg  daily. She had no itching/hives for about 4 days with the above change however the past 2 days she has been itchy all day and breaking out in hives. No swelling with this.   Patient has not tried Careers adviser for this.  Using topical benadryl cream.   Assessment and Plan: Krista [name] is a 42 y.o. female with: Urticaria Flared again despite taking Xyzal 5mg  BID, famotidine 20mg  BID and Singulair 10mg  daily. Initially had benefit when switched from Claritin. Allergic to steroids. Start allegra (fexofenadine) 360mg  which is 2 tablets twice a day. OR take Xyzal (levocetirizine) 10mg  (2 tablets) twice a day.  If symptoms are not controlled or causes drowsiness let us know. Continue Pepcid (famotidine) 20mg  twice a day.  Continue Singulair (montelukast) 10mg  daily at night. Read about Xolair injections - 300mg  every 4 weeks. Start PA process.  Avoid the following potential triggers: alcohol, tight clothing, NSAIDs, hot showers and getting overheated. See below for proper skin care.  May use topical benadryl cream prn itching. Use Eucrisa (crisaborole) 2% ointment twice a day on mild rash flares on the face and body. This is a non-steroid ointment.    Anaphylaxis Most likely allergic to steroids. Continue to avoid for now in all forms - oral, IM, IV, topical.   Other allergic rhinitis 2024 bloodwork positive to dog, grass, trees and ragweed. See below for environmental control measures.   Return in about 4 weeks (around 01/19/2023).  Meds ordered this encounter  Medications   fexofenadine (ALLEGRA) 180 MG tablet    Sig: Take 2 tablets twice a day for hives.    Dispense:  120 tablet    Refill:  3   Crisaborole (EUCRISA) 2 % OINT    Sig: Apply 1 Application topically 2 (two) times daily as needed (mild rash).    Dispense:  60 g    Refill:  1   Lab Orders  No laboratory test(s) ordered today    Diagnostics: None.   Medication List:  Current Outpatient Medications  Medication Sig Dispense Refill   albuterol (PROVENTIL) (2.5 MG/3ML) 0.083% nebulizer solution Take 3 mLs (2.5 mg total) by nebulization every 4 (four) hours as needed for wheezing or shortness of breath.     buPROPion (WELLBUTRIN XL) 300 MG 24 hr tablet Take 300 mg by mouth daily.     Crisaborole (EUCRISA) 2 % OINT Apply 1 Application topically 2 (two) times daily as needed (mild rash). 60 g 1   EPINEPHrine (EPIPEN 2-PAK) 0.3 mg/0.3 mL IJ SOAJ injection Inject 0.3 mg into the muscle as needed for anaphylaxis.     famotidine (PEPCID) 20 MG tablet Take 1 tablet (20 mg total) by mouth 2 (two) times daily. 60 tablet 5   famotidine (PEPCID)  20-0.9 MG/50ML-% Inject 50 mLs (20 mg total) into the vein every 12 (twelve) hours.     fexofenadine (ALLEGRA) 180 MG tablet Take 2 tablets twice a day for hives. 120 tablet 3   levocetirizine (XYZAL) 5 MG tablet Take 1 tablet (5 mg total) by mouth 2 (two) times daily. 30 tablet 5   montelukast (SINGULAIR) 10 MG tablet Take 1 tablet (10 mg total) by mouth at bedtime. 30 tablet 5   omeprazole (PRILOSEC) 40 MG capsule Take 40 mg by mouth daily.     No current facility-administered medications for this visit.    Allergies: Allergies  Allergen Reactions   Decadron [Dexamethasone] Anaphylaxis   Medrol [Methylprednisolone] Anaphylaxis   Prednisone Hives, Itching, Swelling and Other (See Comments)    Lips and tongue swell     Chlorhexidine    I reviewed her past medical history, social history, family history, and environmental history and no significant changes have been reported from her previous visit.  Review of Systems  Constitutional:  Negative for appetite change, chills, fever and unexpected weight change.  HENT:  Negative for congestion and rhinorrhea.   Eyes:  Negative for itching.  Respiratory:  Negative for cough, chest tightness, shortness of breath and wheezing.   Cardiovascular:  Negative for chest pain.  Gastrointestinal:  Negative for abdominal pain.  Genitourinary:  Negative for difficulty urinating.  Skin:  Positive for rash.  Neurological:  Negative for headaches.    Objective: BP 104/70   Pulse 63   Temp 98.7 F (37.1 C) (Temporal)   Resp 16   Wt 119 lb 6.4 oz (54.2 kg)   LMP 09/06/2014   SpO2 100%   BMI 25.84 kg/m  Body mass index is 25.84 kg/m. Physical Exam Vitals and nursing note reviewed.  Constitutional:      Appearance: Normal appearance. She is well-developed.  HENT:     Head: Normocephalic and atraumatic.     Right Ear: Tympanic membrane and external ear normal.     Left Ear: Tympanic membrane and external ear normal.     Nose: Nose normal.     Mouth/Throat:     Mouth: Mucous membranes are moist.     Pharynx: Oropharynx is clear.  Eyes:     Conjunctiva/sclera: Conjunctivae normal.  Cardiovascular:     Rate and Rhythm: Normal rate and regular rhythm.     Heart sounds: Normal heart sounds. No murmur heard.    No friction rub. No gallop.  Pulmonary:     Effort: Pulmonary effort is normal.     Breath sounds: Normal breath sounds. No wheezing, rhonchi or rales.  Musculoskeletal:     Cervical back: Neck supple.  Skin:    General: Skin is  warm.     Findings: Rash present.     Comments: Erythematous hue on anterior chest. Large urticarial patch on left medial upper thigh.  Neurological:     Mental Status: She is alert and oriented to person, place, and time.  Psychiatric:        Behavior: Behavior normal.    Previous notes and tests were reviewed. The plan was reviewed with the patient/family, and all questions/concerned were addressed.  It was my pleasure to see Krista [name] today and participate in her care. Please feel free to contact me with any questions or concerns.  Sincerely,  Wyline Mood, DO Allergy & Immunology  [facility] office: [phone] [facility] office: [phone]

## 2022-12-24 ENCOUNTER — Telehealth: Payer: Self-pay

## 2022-12-24 NOTE — Telephone Encounter (Signed)
PA request received via CMM for Eucrisa 2% ointment  PA submitted to [employer] and is pending determination  Key: [id number]

## 2022-12-27 NOTE — Telephone Encounter (Signed)
PA has been APPROVED from 12/25/2022-12/24/2023

## 2022-12-28 ENCOUNTER — Encounter: Payer: Self-pay | Admitting: Allergy

## 2022-12-30 ENCOUNTER — Encounter: Payer: Self-pay | Admitting: Allergy

## 2022-12-30 ENCOUNTER — Telehealth: Payer: Self-pay | Admitting: *Deleted

## 2022-12-30 NOTE — Telephone Encounter (Signed)
Called patient and advised approval, copay card to [facility] for Xolair. Will reach out once delivery set to make appt to start therapy

## 2022-12-30 NOTE — Telephone Encounter (Signed)
Got approval and submit for Xolair and called patient

## 2023-01-11 ENCOUNTER — Telehealth: Payer: Self-pay | Admitting: *Deleted

## 2023-01-11 ENCOUNTER — Encounter: Payer: Self-pay | Admitting: Allergy

## 2023-01-11 NOTE — Telephone Encounter (Addendum)
Called patient - DOB/DPR verified - LMOVM regarding below notation.  Per Tammy: Called patient and advised approval, copay card to [facility] for Xolair. Will reach out once delivery set to make appt to start therapy   Gave patient Tammy's contact information as well.   Patient confirmed she's following provider directions as well with no relief:   Hives: Start allegra (fexofenadine) 360mg  which is 2 tablets twice a day. OR you can try to take Xyzal (levocetirizine) 10mg  (2 tablets) twice a day.  If symptoms are not controlled or causes drowsiness let us know. Continue Pepcid (famotidine) 20mg  twice a day.  Continue Singulair (montelukast) 10mg  daily at night.   Forwarding message to provider for next step.

## 2023-01-11 NOTE — Telephone Encounter (Signed)
Patient send mychart message to Dr Delorse Lek. I reached out and patient Krista [name] ready to be shipped . Also Dr Delorse Lek advised to instruct patient to increase her allegra to 2 BID and Xyzal 2 BID for now

## 2023-01-11 NOTE — Telephone Encounter (Signed)
Patient called and was advised Xolair rx ready and number to call and schedule delivery. Also advised of antihistamine increase per Dr Delorse Lek

## 2023-01-13 DIAGNOSIS — Z903 Acquired absence of stomach [part of]: Secondary | ICD-10-CM | POA: Diagnosis not present

## 2023-01-13 DIAGNOSIS — Z713 Dietary counseling and surveillance: Secondary | ICD-10-CM | POA: Diagnosis not present

## 2023-01-13 DIAGNOSIS — Z6824 Body mass index (BMI) 24.0-24.9, adult: Secondary | ICD-10-CM | POA: Diagnosis not present

## 2023-01-19 ENCOUNTER — Other Ambulatory Visit: Payer: Self-pay | Admitting: Allergy

## 2023-01-19 ENCOUNTER — Other Ambulatory Visit: Payer: Self-pay | Admitting: *Deleted

## 2023-01-19 ENCOUNTER — Encounter: Payer: Self-pay | Admitting: Allergy

## 2023-01-19 MED ORDER — LEVOCETIRIZINE DIHYDROCHLORIDE 5 MG PO TABS: 10.0000 mg | ORAL_TABLET | Freq: Two times a day (BID) | ORAL | 1 refills | Status: DC

## 2023-01-20 ENCOUNTER — Ambulatory Visit: Payer: 59 | Admitting: Allergy and Immunology

## 2023-01-20 ENCOUNTER — Ambulatory Visit (INDEPENDENT_AMBULATORY_CARE_PROVIDER_SITE_OTHER): Payer: 59

## 2023-01-20 DIAGNOSIS — L501 Idiopathic urticaria: Secondary | ICD-10-CM | POA: Diagnosis not present

## 2023-01-20 MED ORDER — OMALIZUMAB 150 MG/ML ~~LOC~~ SOSY
300.0000 mg | PREFILLED_SYRINGE | Freq: Once | SUBCUTANEOUS | Status: AC
  Administered 2023-01-20: 300 mg via SUBCUTANEOUS

## 2023-01-20 NOTE — Progress Notes (Signed)
Immunotherapy   Patient Details  Name: Krista [name] MRN: [id number] Date of Birth: 1981-05-16  01/20/2023  Krista [name] started injections for  Xolair  Frequency: EVERY 4 WEEKS  Epi-Pen:Epi-Pen Available  Consent signed and patient instructions given.  Patient signed consent form and waited 30 minutes in office for the Xolair new start appointment. Patient received 300mg  of Xolair today with no issues.    Orson Aloe 01/20/2023, 5:15 PM

## 2023-01-26 DIAGNOSIS — Z9889 Other specified postprocedural states: Secondary | ICD-10-CM | POA: Diagnosis not present

## 2023-02-03 ENCOUNTER — Other Ambulatory Visit: Payer: Self-pay

## 2023-02-03 ENCOUNTER — Encounter: Payer: Self-pay | Admitting: Allergy

## 2023-02-03 ENCOUNTER — Ambulatory Visit (INDEPENDENT_AMBULATORY_CARE_PROVIDER_SITE_OTHER): Payer: 59 | Admitting: Allergy

## 2023-02-03 VITALS — BP 100/60 | HR 74 | Temp 98.6°F | Resp 20 | Wt 123.9 lb

## 2023-02-03 DIAGNOSIS — L501 Idiopathic urticaria: Secondary | ICD-10-CM

## 2023-02-03 DIAGNOSIS — T782XXD Anaphylactic shock, unspecified, subsequent encounter: Secondary | ICD-10-CM | POA: Diagnosis not present

## 2023-02-03 DIAGNOSIS — N3941 Urge incontinence: Secondary | ICD-10-CM | POA: Diagnosis not present

## 2023-02-03 DIAGNOSIS — R300 Dysuria: Secondary | ICD-10-CM | POA: Diagnosis not present

## 2023-02-03 DIAGNOSIS — J3089 Other allergic rhinitis: Secondary | ICD-10-CM | POA: Diagnosis not present

## 2023-02-03 DIAGNOSIS — N951 Menopausal and female climacteric states: Secondary | ICD-10-CM | POA: Diagnosis not present

## 2023-02-03 NOTE — Progress Notes (Addendum)
Follow-up Note  RE: RANAY MCCOMMONS MRN: [id number] DOB: 1980/11/20 Date of Office Visit: 02/03/2023   History of present illness: Krista [name] is a 42 y.o. female presenting today for follow-up of chronic and recurrent urticaria and history of anaphylaxis to steroids.  She also history of allergic rhinitis.  She was last seen in the office on 12/22/2022 by Dr. Selena Batten. After I initially met her and discussed starting a high-dose antihistamine regimen regimen and thinking about Xolair if this regimen was not effective she continued to have hives that were widespread and she was quite miserable.  She saw her dermatologist who also said she should go on Xolair.  She then saw Dr. Selena Batten for an acute visit for the hives who also recommended Xolair as well as increased her antihistamine regimen.  Her current regimen includes Xyzal 2 tablets daily, Allegra 2 tablets daily, Pepcid 2 tablets daily and Singulair 1 tablet daily.  She did get started on Xolair on 01/20/2023 which she tolerated this well.  She states about a week into this first dose she started noticing improvements in her hives.  Currently she states she has been doing very well without hives or itch.  However she did start noticing when she was washing her hair that her hair seem to be shedding more than usual.  When she does continue to avoid steroids and all forms at this time.  Review of systems in the past 4 weeks: Review of Systems  Constitutional: Negative.   HENT: Negative.    Eyes: Negative.   Respiratory: Negative.    Cardiovascular: Negative.   Gastrointestinal: Negative.   Musculoskeletal: Negative.   Skin:        See HPI  Allergic/Immunologic: Negative.   Neurological: Negative.      All other systems negative unless noted above in HPI  Past medical/social/surgical/family history have been reviewed and are unchanged unless specifically indicated below.  No changes  Medication List: Current Outpatient Medications   Medication Sig Dispense Refill   albuterol (PROVENTIL) (2.5 MG/3ML) 0.083% nebulizer solution Take 3 mLs (2.5 mg total) by nebulization every 4 (four) hours as needed for wheezing or shortness of breath.     buPROPion (WELLBUTRIN XL) 300 MG 24 hr tablet Take 300 mg by mouth daily.     Crisaborole (EUCRISA) 2 % OINT Apply 1 Application topically 2 (two) times daily as needed (mild rash). 60 g 1   EPINEPHrine (EPIPEN 2-PAK) 0.3 mg/0.3 mL IJ SOAJ injection Inject 0.3 mg into the muscle as needed for anaphylaxis.     famotidine (PEPCID) 20 MG tablet Take 1 tablet (20 mg total) by mouth 2 (two) times daily. 60 tablet 5   famotidine (PEPCID) 20-0.9 MG/50ML-% Inject 50 mLs (20 mg total) into the vein every 12 (twelve) hours.     fexofenadine (ALLEGRA) 180 MG tablet Take 2 tablets twice a day for hives. 120 tablet 3   levocetirizine (XYZAL) 5 MG tablet Take 2 tablets (10 mg total) by mouth 2 (two) times daily. 360 tablet 1   montelukast (SINGULAIR) 10 MG tablet Take 1 tablet (10 mg total) by mouth at bedtime. 30 tablet 5   omeprazole (PRILOSEC) 40 MG capsule Take 40 mg by mouth daily.     No current facility-administered medications for this visit.     Known medication allergies: Allergies  Allergen Reactions   Decadron [Dexamethasone] Anaphylaxis   Medrol [Methylprednisolone] Anaphylaxis   Prednisone Hives, Itching, Swelling and Other (See Comments)  Lips and tongue swell     Chlorhexidine      Physical examination: Blood pressure 100/60, pulse 74, temperature 98.6 F (37 C), temperature source Temporal, resp. rate 20, weight 123 lb 14.4 oz (56.2 kg), last menstrual period 09/06/2014, SpO2 100 %, unknown if currently breastfeeding.  General: Alert, interactive, in no acute distress. HEENT: PERRLA, TMs pearly gray, turbinates non-edematous without discharge, post-pharynx non erythematous. Neck: Supple without lymphadenopathy. Lungs: Clear to auscultation without wheezing, rhonchi or  rales. {no increased work of breathing. CV: Normal S1, S2 without murmurs. Abdomen: Nondistended, nontender. Skin: Warm and dry, without lesions or rashes. Extremities:  No clubbing, cyanosis or edema. Neuro:   Grossly intact.  Diagnositics/Labs: None today  Assessment and plan: Updated plan 02/04/23 -     Urticaria-improved Anaphylaxis secondary to steroid  - for continued hive control continue the following high-dose antihistamine regimen:    - Xyzal 5mg  2 tab twice a day.      - Allegra 180 mg 2 tablet twice a day    - Pepcid 40mg  daily (20mg  twice a day).     - Singulair 10mg  daily at bedtime.      - Xolair monthly injections. - continue avoidance of steroid medications in all forms, oral, topical, injectables at this times.  Discussed that she may have an allergy to the incipients of the use of steroid medications and may not directly be allergic to the steroid itself.  To fully now determine if she can tolerate these medications again would want her to be off of Xolair.  If we do the test and she tolerates while on Xolair then I can only conclude that it is safe to use while she remains on Xolair. - discussed hopeful that we will be able to wean some of the oral medications down when she has been on Xolair for about 2 to 3 months or longer and remains with good control - discussed today that there are reports of hair thinning/hair loss secondary to Xolair use.  This however is quite rare occurring in about 1% of the Xolair population.  Advised to discuss with her dermatologist to see if there is anything that can be recommended to help decrease hair thinning so that she can stay on Xolair since she is finding benefit - have access to self-injectable epinephrine (Epipen or AuviQ) 0.3mg  at all times - follow emergency action plan in case of allergic reaction  Allergic rhinitis - should be well-controlled at this time with the high-dose antihistamines and Singulair as  above  Follow-up in 6 months or sooner if needed  I appreciate the opportunity to take part in Elzie's care. Please do not hesitate to contact me with questions.  Sincerely,   Margo Aye, MD Allergy/Immunology [facility]

## 2023-02-03 NOTE — Patient Instructions (Addendum)
Urticaria-improved Anaphylaxis secondary to steroid  - for continued hive control continue the following high-dose antihistamine regimen:    - Xyzal 5mg  2 tab twice a day.      - Allegra 180 mg 2 tablet twice a day    - Pepcid 40mg  daily (20mg  twice a day).     - Singulair 10mg  daily at bedtime.      - Xolair monthly injections. - continue avoidance of steroid medications in all forms, oral, topical, injectables at this times.  Discussed that she may have an allergy to the incipients of the use of steroid medications and may not directly be allergic to the steroid itself.  To fully now determine if she can tolerate these medications again would want her to be off of Xolair.  If we do the test and she tolerates while on Xolair then I can only conclude that it is safe to use while she remains on Xolair. - discussed hopeful that we will be able to wean some of the oral medications down when she has been on Xolair for about 2 to 3 months or longer and remains with good control - discussed today that there are reports of hair thinning/hair loss secondary to Xolair use.  This however is quite rare occurring in about 1% of the Xolair population.  Advised to discuss with her dermatologist to see if there is anything that can be recommended to help decrease hair thinning so that she can stay on Xolair since she is finding benefit - have access to self-injectable epinephrine (Epipen or AuviQ) 0.3mg  at all times - follow emergency action plan in case of allergic reaction  Allergic rhinitis - should be well-controlled at this time with the high-dose antihistamines and Singulair as above  Follow-up in 6 months or sooner if needed

## 2023-02-08 ENCOUNTER — Ambulatory Visit: Payer: 59 | Admitting: Allergy and Immunology

## 2023-02-18 ENCOUNTER — Ambulatory Visit: Payer: Medicaid Other

## 2023-02-18 DIAGNOSIS — L501 Idiopathic urticaria: Secondary | ICD-10-CM | POA: Diagnosis not present

## 2023-02-18 MED ORDER — OMALIZUMAB 150 MG/ML ~~LOC~~ SOSY
300.0000 mg | PREFILLED_SYRINGE | SUBCUTANEOUS | Status: AC
  Administered 2023-02-18 – 2023-09-22 (×7): 300 mg via SUBCUTANEOUS

## 2023-02-22 NOTE — Progress Notes (Unsigned)
[name] [address] [place] [address] Phone: [phone] Subjective:   Krista [name], am serving as a scribe for Dr. Antoine Primas.  I'm seeing this patient by the request  of:  Hadley Pen, MD  CC: back and neck pain follow up   [id number]  Krista [name] is a 42 y.o. female coming in with complaint of back and neck pain. OMT 12/22/2022. Patient states   Medications patient has been prescribed: None  Taking:         Reviewed prior external information including notes and imaging from previsou exam, outside providers and external EMR if available.   As well as notes that were available from care everywhere and other healthcare systems.  Past medical history, social, surgical and family history all reviewed in electronic medical record.  No pertanent information unless stated regarding to the chief complaint.   Past Medical History:  Diagnosis Date   Asthma    exercise induced   Complication of anesthesia    DVT (deep venous thrombosis) (HCC)    Family history of adverse reaction to anesthesia    PONV   Obesity    PONV (postoperative nausea and vomiting)    Urticaria     Allergies  Allergen Reactions   Decadron [Dexamethasone] Anaphylaxis   Medrol [Methylprednisolone] Anaphylaxis   Prednisone Hives, Itching, Swelling and Other (See Comments)    Lips and tongue swell     Chlorhexidine      Review of Systems:  No headache, visual changes, nausea, vomiting, diarrhea, constipation, dizziness, abdominal pain, skin rash, fevers, chills, night sweats, weight loss, swollen lymph nodes, body aches, joint swelling, chest pain, shortness of breath, mood changes. POSITIVE muscle aches  Objective  Blood pressure 108/64, height 4\' 9"  (1.448 m), weight 123 lb (55.8 kg), last menstrual period 09/06/2014, unknown if currently breastfeeding.   General: No apparent distress alert and oriented x3 mood and affect normal,  dressed appropriately.  HEENT: Pupils equal, extraocular movements intact  Respiratory: Patient's speak in full sentences and does not appear short of breath  Cardiovascular: No lower extremity edema, non tender, no erythema  Gait MSK:  Back  Low back exam does have some mild loss of lordosis.  Still some mild tightness noted in the FABER test than anything else.  Osteopathic findings  C2 flexed rotated and side bent right C7 flexed rotated and side bent left T3 extended rotated and side bent right inhaled rib T11 extended rotated and side bent left L1 flexed rotated and side bent right Sacrum right on right       Assessment and Plan:  Scapular dyskinesis Overall patient has been doing relatively well.  Discussed icing regimen and home exercises, discussed which activities to do and which ones to avoid.  Increase activity slowly over the course of next several weeks.  Patient will increase activity and will follow-up with me again in 6 to 8 weeks.  Discussed potentially checking vitamin D and iron at some point.    Nonallopathic problems  Decision today to treat with OMT was based on Physical Exam  After verbal consent patient was treated with HVLA, ME, FPR techniques in cervical, rib, thoracic, lumbar, and sacral  areas  Patient tolerated the procedure well with improvement in symptoms  Patient given exercises, stretches and lifestyle modifications  See medications in patient instructions if given  Patient will follow up in 4-8 weeks    The above documentation has been reviewed  and is accurate and complete Judi Saa, DO          Note: This dictation was prepared with Dragon dictation along with smaller phrase technology. Any transcriptional errors that result from this process are unintentional.

## 2023-02-23 ENCOUNTER — Ambulatory Visit: Payer: Medicaid Other | Admitting: Family Medicine

## 2023-02-23 ENCOUNTER — Encounter: Payer: Self-pay | Admitting: Family Medicine

## 2023-02-23 VITALS — BP 108/64 | Ht <= 58 in | Wt 123.0 lb

## 2023-02-23 DIAGNOSIS — M9902 Segmental and somatic dysfunction of thoracic region: Secondary | ICD-10-CM

## 2023-02-23 DIAGNOSIS — M9903 Segmental and somatic dysfunction of lumbar region: Secondary | ICD-10-CM | POA: Diagnosis not present

## 2023-02-23 DIAGNOSIS — M9908 Segmental and somatic dysfunction of rib cage: Secondary | ICD-10-CM | POA: Diagnosis not present

## 2023-02-23 DIAGNOSIS — M9901 Segmental and somatic dysfunction of cervical region: Secondary | ICD-10-CM

## 2023-02-23 DIAGNOSIS — M9904 Segmental and somatic dysfunction of sacral region: Secondary | ICD-10-CM | POA: Diagnosis not present

## 2023-02-23 DIAGNOSIS — M2550 Pain in unspecified joint: Secondary | ICD-10-CM | POA: Diagnosis not present

## 2023-02-23 DIAGNOSIS — G2589 Other specified extrapyramidal and movement disorders: Secondary | ICD-10-CM

## 2023-02-23 LAB — VITAMIN B12: Vitamin B-12: 320 pg/mL (ref 211–911)

## 2023-02-23 LAB — IBC PANEL
Iron: 90 ug/dL (ref 42–145)
Saturation Ratios: 24.4 % (ref 20.0–50.0)
TIBC: 369.6 ug/dL (ref 250.0–450.0)
Transferrin: 264 mg/dL (ref 212.0–360.0)

## 2023-02-23 LAB — VITAMIN D 25 HYDROXY (VIT D DEFICIENCY, FRACTURES): VITD: 35.54 ng/mL (ref 30.00–100.00)

## 2023-02-23 NOTE — Assessment & Plan Note (Signed)
Overall patient has been doing relatively well.  Discussed icing regimen and home exercises, discussed which activities to do and which ones to avoid.  Increase activity slowly over the course of next several weeks.  Patient will increase activity and will follow-up with me again in 6 to 8 weeks.  Discussed potentially checking vitamin D and iron at some point.

## 2023-02-23 NOTE — Patient Instructions (Signed)
Good to see you Good luck with the kiddos Enjoy vacation  See me in 7-8 weeks

## 2023-03-03 ENCOUNTER — Telehealth: Payer: Self-pay | Admitting: *Deleted

## 2023-03-03 NOTE — Telephone Encounter (Signed)
L/m for patient primary Ins in active and MCD coverage still shows another coverage [employer]. Unable to get [place] shipped

## 2023-03-18 ENCOUNTER — Other Ambulatory Visit: Payer: Self-pay

## 2023-03-18 ENCOUNTER — Encounter: Payer: Self-pay | Admitting: Allergy

## 2023-03-18 ENCOUNTER — Ambulatory Visit: Payer: Medicaid Other

## 2023-03-18 MED ORDER — NIRMATRELVIR/RITONAVIR (PAXLOVID)TABLET: 3.0000 | ORAL_TABLET | Freq: Two times a day (BID) | ORAL | 0 refills | Status: AC

## 2023-03-18 MED ORDER — NIRMATRELVIR/RITONAVIR (PAXLOVID)TABLET: 3.0000 | ORAL_TABLET | Freq: Two times a day (BID) | ORAL | 0 refills | Status: DC

## 2023-03-18 NOTE — Addendum Note (Signed)
Addended by: Dub Mikes on: 03/18/2023 06:10 PM   Modules accepted: Orders

## 2023-03-21 ENCOUNTER — Other Ambulatory Visit (HOSPITAL_COMMUNITY): Payer: Self-pay

## 2023-03-21 ENCOUNTER — Telehealth: Payer: Self-pay | Admitting: *Deleted

## 2023-03-21 MED ORDER — OMALIZUMAB 150 MG/ML ~~LOC~~ SOSY
300.0000 mg | PREFILLED_SYRINGE | SUBCUTANEOUS | 11 refills | Status: DC
  Filled 2023-03-21: qty 2, 28d supply, fill #0

## 2023-03-21 NOTE — Telephone Encounter (Signed)
Called patient and advised [employer] unable to service with new [employer] ins will send to [facility]

## 2023-03-22 ENCOUNTER — Other Ambulatory Visit (HOSPITAL_COMMUNITY): Payer: Self-pay

## 2023-03-22 ENCOUNTER — Other Ambulatory Visit: Payer: Self-pay

## 2023-03-23 ENCOUNTER — Other Ambulatory Visit: Payer: Self-pay

## 2023-03-25 ENCOUNTER — Other Ambulatory Visit: Payer: Self-pay | Admitting: Family Medicine

## 2023-03-25 ENCOUNTER — Ambulatory Visit (INDEPENDENT_AMBULATORY_CARE_PROVIDER_SITE_OTHER): Payer: Medicaid Other

## 2023-03-25 DIAGNOSIS — Z0000 Encounter for general adult medical examination without abnormal findings: Secondary | ICD-10-CM

## 2023-03-25 DIAGNOSIS — L501 Idiopathic urticaria: Secondary | ICD-10-CM

## 2023-04-19 ENCOUNTER — Ambulatory Visit: Payer: Medicaid Other | Admitting: Family Medicine

## 2023-04-22 ENCOUNTER — Ambulatory Visit (INDEPENDENT_AMBULATORY_CARE_PROVIDER_SITE_OTHER): Payer: Medicaid Other

## 2023-04-22 ENCOUNTER — Ambulatory Visit: Payer: Medicaid Other

## 2023-04-22 DIAGNOSIS — L501 Idiopathic urticaria: Secondary | ICD-10-CM | POA: Diagnosis not present

## 2023-04-27 ENCOUNTER — Ambulatory Visit: Payer: Medicaid Other | Admitting: Family Medicine

## 2023-04-30 ENCOUNTER — Other Ambulatory Visit: Payer: Self-pay

## 2023-05-05 ENCOUNTER — Ambulatory Visit
Admission: RE | Admit: 2023-05-05 | Discharge: 2023-05-05 | Disposition: A | Discharge status: Home / Self Care | Payer: Medicaid Other | Source: Ambulatory Visit | Attending: Family Medicine | Admitting: Family Medicine

## 2023-05-05 ENCOUNTER — Ambulatory Visit: Payer: Medicaid Other

## 2023-05-05 DIAGNOSIS — Z0000 Encounter for general adult medical examination without abnormal findings: Secondary | ICD-10-CM

## 2023-05-13 ENCOUNTER — Ambulatory Visit: Payer: Medicaid Other | Admitting: Family Medicine

## 2023-05-16 ENCOUNTER — Other Ambulatory Visit (HOSPITAL_COMMUNITY): Payer: Self-pay

## 2023-05-17 NOTE — Progress Notes (Signed)
[name] [address] [place] [address] Phone: [phone] Subjective:   Bruce Donath, am serving as a scribe for Dr. Antoine Primas.  I'm seeing this patient by the request  of:  Hadley Pen, MD  CC: Back and neck pain follow-up  [id number]  Krista [name] is a 42 y.o. female coming in with complaint of back and neck pain. OMT 02/23/2023. Patient states taht she has had some tension in traps.  Patient has been working more and feels like this is contributed to some of the discomfort and pain.  Medications patient has been prescribed: None          Reviewed prior external information including notes and imaging from previsou exam, outside providers and external EMR if available.   As well as notes that were available from care everywhere and other healthcare systems.  Past medical history, social, surgical and family history all reviewed in electronic medical record.  No pertanent information unless stated regarding to the chief complaint.   Past Medical History:  Diagnosis Date   Asthma    exercise induced   Complication of anesthesia    DVT (deep venous thrombosis) (HCC)    Family history of adverse reaction to anesthesia    PONV   Obesity    PONV (postoperative nausea and vomiting)    Urticaria     Allergies  Allergen Reactions   Decadron [Dexamethasone] Anaphylaxis   Medrol [Methylprednisolone] Anaphylaxis   Prednisone Hives, Itching, Swelling and Other (See Comments)    Lips and tongue swell     Chlorhexidine      Review of Systems:  No headache, visual changes, nausea, vomiting, diarrhea, constipation, dizziness, abdominal pain, skin rash, fevers, chills, night sweats, weight loss, swollen lymph nodes, body aches, joint swelling, chest pain, shortness of breath, mood changes. POSITIVE muscle aches  Objective  Blood pressure 118/76, pulse 67, height 4\' 9"  (1.448 m), weight 130 lb (59 kg), last menstrual  period 09/06/2014, SpO2 97%, unknown if currently breastfeeding.   General: No apparent distress alert and oriented x3 mood and affect normal, dressed appropriately.  HEENT: Pupils equal, extraocular movements intact  Respiratory: Patient's speak in full sentences and does not appear short of breath  Cardiovascular: No lower extremity edema, non tender, no erythema  Neck exam does have significant loss of lordosis.  Limited sidebending right greater than left.  Osteopathic findings  C2 flexed rotated and side bent right C6 flexed rotated and side bent right T3 extended rotated and side bent right inhaled rib T9 extended rotated and side bent left L2 flexed rotated and side bent right Sacrum right on right       Assessment and Plan:  Scapular dyskinesis Discussed posture and ergonomics, discussed which activities to do and which ones to avoid, increase activity slowly over the course of next several weeks.  Patient will work on this as possible.  Has been working more and longer hours.  We discussed if needed anti-inflammatories such as meloxicam.  Muscle relaxers at night.  Follow-up again in 6 to 8 weeks    Nonallopathic problems  Decision today to treat with OMT was based on Physical Exam  After verbal consent patient was treated with HVLA, ME, FPR techniques in cervical, rib, thoracic, lumbar, and sacral  areas  Patient tolerated the procedure well with improvement in symptoms  Patient given exercises, stretches and lifestyle modifications  See medications in patient instructions if given  Patient will  follow up in 4-8 weeks    The above documentation has been reviewed and is accurate and complete Judi Saa, DO          Note: This dictation was prepared with Dragon dictation along with smaller phrase technology. Any transcriptional errors that result from this process are unintentional.

## 2023-05-24 ENCOUNTER — Ambulatory Visit: Payer: Medicaid Other | Admitting: Family Medicine

## 2023-05-24 ENCOUNTER — Encounter: Payer: Self-pay | Admitting: Family Medicine

## 2023-05-24 VITALS — BP 118/76 | HR 67 | Ht <= 58 in | Wt 130.0 lb

## 2023-05-24 DIAGNOSIS — M9904 Segmental and somatic dysfunction of sacral region: Secondary | ICD-10-CM | POA: Diagnosis not present

## 2023-05-24 DIAGNOSIS — M9901 Segmental and somatic dysfunction of cervical region: Secondary | ICD-10-CM | POA: Diagnosis not present

## 2023-05-24 DIAGNOSIS — M9908 Segmental and somatic dysfunction of rib cage: Secondary | ICD-10-CM | POA: Diagnosis not present

## 2023-05-24 DIAGNOSIS — M9903 Segmental and somatic dysfunction of lumbar region: Secondary | ICD-10-CM | POA: Diagnosis not present

## 2023-05-24 DIAGNOSIS — M9902 Segmental and somatic dysfunction of thoracic region: Secondary | ICD-10-CM

## 2023-05-24 DIAGNOSIS — G2589 Other specified extrapyramidal and movement disorders: Secondary | ICD-10-CM | POA: Diagnosis not present

## 2023-05-24 NOTE — Patient Instructions (Signed)
Keep them kids away from you Watch posture See you again in 6-8 weeks

## 2023-05-24 NOTE — Assessment & Plan Note (Signed)
Discussed posture and ergonomics, discussed which activities to do and which ones to avoid, increase activity slowly over the course of next several weeks.  Patient will work on this as possible.  Has been working more and longer hours.  We discussed if needed anti-inflammatories such as meloxicam.  Muscle relaxers at night.  Follow-up again in 6 to 8 weeks

## 2023-05-27 ENCOUNTER — Ambulatory Visit: Payer: Medicaid Other | Admitting: Allergy

## 2023-05-27 ENCOUNTER — Ambulatory Visit: Payer: Medicaid Other

## 2023-05-27 ENCOUNTER — Other Ambulatory Visit: Payer: Self-pay

## 2023-05-27 ENCOUNTER — Encounter: Payer: Self-pay | Admitting: Allergy

## 2023-05-27 VITALS — BP 118/82 | HR 76 | Temp 98.6°F | Resp 14

## 2023-05-27 DIAGNOSIS — L501 Idiopathic urticaria: Secondary | ICD-10-CM | POA: Diagnosis not present

## 2023-05-27 DIAGNOSIS — T782XXD Anaphylactic shock, unspecified, subsequent encounter: Secondary | ICD-10-CM

## 2023-05-27 MED ORDER — EPINEPHRINE 0.3 MG/0.3ML IJ SOAJ: 0.3000 mg | INTRAMUSCULAR | 1 refills | Status: AC | PRN

## 2023-05-27 MED ORDER — LEVOCETIRIZINE DIHYDROCHLORIDE 5 MG PO TABS: ORAL_TABLET | ORAL | 1 refills | Status: DC

## 2023-05-27 NOTE — Progress Notes (Signed)
Follow-up Note  RE: Krista [name] MRN: [id number] DOB: 04-25-1981 Date of Office Visit: 05/27/2023   History of present illness: Krista [name] is a 42 y.o. female presenting today for follow-up of urticaria and anaphylaxis secondary to steroid.  She was last seen in the office on 02/03/2023 by myself.  Discussed the use of AI scribe software for clinical note transcription with the patient, who gave verbal consent to proceed.  She has been on Xolair once a month since June. She reports no adverse reactions to the injections and has experienced no hives or itching since starting the medication. She has not needed to apply creams and is maintaining skin health with regular lotioning after bathing.  The patient has discontinued the use of Allegra and has reduced her intake of Xyzal (Levocetirizine) to one a day. She continues to take Pepcid 40 mg daily, and has stopped taking Singulair at night.  She also takes Omeprazole for a mild reflux condition.  She has not experienced any issues with environmental allergens, which I attributes to the Xolair.. She has an up-to-date EpiPen.  She has not had any exposures to systemic steroids.  Review of systems: 10pt ROS negative unless noted above in HPI  All other systems negative unless noted above in HPI  Past medical/social/surgical/family history have been reviewed and are unchanged unless specifically indicated below.  No changes  Medication List: Current Outpatient Medications  Medication Sig Dispense Refill   albuterol (PROVENTIL) (2.5 MG/3ML) 0.083% nebulizer solution Take 3 mLs (2.5 mg total) by nebulization every 4 (four) hours as needed for wheezing or shortness of breath.     buPROPion (WELLBUTRIN XL) 300 MG 24 hr tablet Take 300 mg by mouth daily.     Crisaborole (EUCRISA) 2 % OINT Apply 1 Application topically 2 (two) times daily as needed (mild rash). 60 g 1   EPINEPHrine (EPIPEN 2-PAK) 0.3 mg/0.3 mL IJ SOAJ injection Inject  0.3 mg into the muscle as needed for anaphylaxis.     EPINEPHrine (EPIPEN 2-PAK) 0.3 mg/0.3 mL IJ SOAJ injection Inject 0.3 mg into the muscle as needed for anaphylaxis. 0.3 mL 1   famotidine (PEPCID) 20 MG tablet Take 1 tablet (20 mg total) by mouth 2 (two) times daily. 60 tablet 5   montelukast (SINGULAIR) 10 MG tablet Take 1 tablet (10 mg total) by mouth at bedtime. 30 tablet 5   omeprazole (PRILOSEC) 40 MG capsule Take 40 mg by mouth daily.     famotidine (PEPCID) 20-0.9 MG/50ML-% Inject 50 mLs (20 mg total) into the vein every 12 (twelve) hours. (Patient not taking: Reported on 05/27/2023)     levocetirizine (XYZAL) 5 MG tablet Take 1 to 2 tablets by mouth daily 360 tablet 1   omalizumab (XOLAIR) 150 MG/ML prefilled syringe Inject 300 mg into the skin every 28 (twenty-eight) days. (Patient not taking: Reported on 05/27/2023) 2 mL 11   Current Facility-Administered Medications  Medication Dose Route Frequency Provider Last Rate Last Admin   omalizumab Geoffry Paradise) prefilled syringe 300 mg  300 mg Subcutaneous Q28 days Marcelyn Bruins, MD   300 mg at 05/27/23 1540     Known medication allergies: Allergies  Allergen Reactions   Decadron [Dexamethasone] Anaphylaxis   Medrol [Methylprednisolone] Anaphylaxis   Prednisone Hives, Itching, Swelling and Other (See Comments)    Lips and tongue swell     Chlorhexidine      Physical examination: Blood pressure 118/82, pulse 76, temperature 98.6 F (37 C), temperature source  Temporal, resp. rate 14, last menstrual period 09/06/2014, SpO2 100%, unknown if currently breastfeeding.  General: Alert, interactive, in no acute distress. HEENT: PERRLA, TMs pearly gray, turbinates non-edematous without discharge, post-pharynx non erythematous. Neck: Supple without lymphadenopathy. Lungs: Clear to auscultation without wheezing, rhonchi or rales. {no increased work of breathing. CV: Normal S1, S2 without murmurs. Abdomen: Nondistended,  nontender. Skin: Warm and dry, without lesions or rashes. Extremities:  No clubbing, cyanosis or edema. Neuro:   Grossly intact.  Diagnositics/Labs: Xolair 300 mg injections given today  Assessment and plan:   Urticaria-improved Anaphylaxis secondary to steroid  - Will try to continue medication wean:    - Xyzal 5mg  1 tab daily.   Stop Pepcid dosing.  If after a week, with no increase in symptoms then can stop Xyzal and use as needed    - Xolair monthly injections.  Once we reach 8-12 months with good control then can attempt if willing a Xolair wean.  - continue avoidance of steroid medications in all forms, oral, topical, injectables at this times.  Discussed that she may have an allergy to the incipients of the use of steroid medications and may not directly be allergic to the steroid itself.  To fully now determine if she can tolerate these medications again would want her to be off of Xolair.  If we do the test and she tolerates while on Xolair then I can only conclude that it is safe to use while she remains on Xolair. - have access to self-injectable epinephrine (Epipen or AuviQ) 0.3mg  at all times - follow emergency action plan in case of allergic reaction  Allergic rhinitis - appears to be well-controlled at this time with the antihistamines.  If needed can continue Xyzal daily dosing for control  Follow-up in 6 months or sooner if needed  I appreciate the opportunity to take part in Meda's care. Please do not hesitate to contact me with questions.  Sincerely,   Margo Aye, MD Allergy/Immunology [facility]

## 2023-05-27 NOTE — Patient Instructions (Signed)
Urticaria-improved Anaphylaxis secondary to steroid  - Will try to continue medication wean:    - Xyzal 5mg  1 tab daily.   Stop Pepcid dosing.  If after a week, with no increase in symptoms then can stop Xyzal and use as needed    - Xolair monthly injections.  Once we reach 8-12 months with good control then can attempt if willing a Xolair wean.  - continue avoidance of steroid medications in all forms, oral, topical, injectables at this times.  Discussed that she may have an allergy to the incipients of the use of steroid medications and may not directly be allergic to the steroid itself.  To fully now determine if she can tolerate these medications again would want her to be off of Xolair.  If we do the test and she tolerates while on Xolair then I can only conclude that it is safe to use while she remains on Xolair. - have access to self-injectable epinephrine (Epipen or AuviQ) 0.3mg  at all times - follow emergency action plan in case of allergic reaction  Allergic rhinitis - appears to be well-controlled at this time with the antihistamines.  If needed can continue Xyzal daily dosing for control  Follow-up in 6 months or sooner if needed

## 2023-05-30 ENCOUNTER — Other Ambulatory Visit (HOSPITAL_COMMUNITY): Payer: Self-pay

## 2023-06-24 ENCOUNTER — Ambulatory Visit: Payer: Medicaid Other

## 2023-07-01 ENCOUNTER — Ambulatory Visit (INDEPENDENT_AMBULATORY_CARE_PROVIDER_SITE_OTHER): Payer: Medicaid Other

## 2023-07-01 DIAGNOSIS — L501 Idiopathic urticaria: Secondary | ICD-10-CM

## 2023-07-15 ENCOUNTER — Encounter: Payer: Self-pay | Admitting: Allergy

## 2023-07-19 NOTE — Telephone Encounter (Signed)
Spoke with patient, she has been rescheduled for every 6 weeks. Flowsheet has been updated to reflect this change.

## 2023-07-19 NOTE — Progress Notes (Unsigned)
  [name] [address] [place] [address] Phone: [phone] Subjective:   Bruce Donath, am serving as a scribe for Dr. Antoine Primas.  I'm seeing this patient by the request  of:  Hadley Pen, MD  CC: back and neck pain follow up   [id number]  Krista [name] is a 42 y.o. female coming in with complaint of back and neck pain. OMT 05/24/2023. Patient states that she is having tightness in R trap.   Medications patient has been prescribed: None  Taking:         Reviewed prior external information including notes and imaging from previsou exam, outside providers and external EMR if available.   As well as notes that were available from care everywhere and other healthcare systems.  Past medical history, social, surgical and family history all reviewed in electronic medical record.  No pertanent information unless stated regarding to the chief complaint.   Past Medical History:  Diagnosis Date   Asthma    exercise induced   Complication of anesthesia    DVT (deep venous thrombosis) (HCC)    Family history of adverse reaction to anesthesia    PONV   Obesity    PONV (postoperative nausea and vomiting)    Urticaria     Allergies  Allergen Reactions   Decadron [Dexamethasone] Anaphylaxis   Medrol [Methylprednisolone] Anaphylaxis   Prednisone Hives, Itching, Swelling and Other (See Comments)    Lips and tongue swell     Chlorhexidine      Review of Systems:  No headache, visual changes, nausea, vomiting, diarrhea, constipation, dizziness, abdominal pain, skin rash, fevers, chills, night sweats, weight loss, swollen lymph nodes, body aches, joint swelling, chest pain, shortness of breath, mood changes. POSITIVE muscle aches  Objective  Last menstrual period 09/06/2014, unknown if currently breastfeeding.   General: No apparent distress alert and oriented x3 mood and affect normal, dressed appropriately.  HEENT:  Pupils equal, extraocular movements intact  Respiratory: Patient's speak in full sentences and does not appear short of breath  Cardiovascular: No lower extremity edema, non tender, no erythema  Gait MSK:  Back does have tightness noted.  Seems to be in the paraspinal musculature.  Does have some limited sidebending right greater than left of the neck.  Some limited with rotation to the left of the neck.  Osteopathic findings  C2 flexed rotated and side bent right C6 flexed rotated and side bent right T4 extended rotated and side bent right inhaled rib L2 flexed rotated and side bent right Sacrum right on right    Assessment and Plan:  No problem-specific Assessment & Plan notes found for this encounter.    Nonallopathic problems  Decision today to treat with OMT was based on Physical Exam  After verbal consent patient was treated with HVLA, ME, FPR techniques in cervical, rib, thoracic, lumbar, and sacral  areas  Patient tolerated the procedure well with improvement in symptoms  Patient given exercises, stretches and lifestyle modifications  See medications in patient instructions if given  Patient will follow up in 8 weeks     The above documentation has been reviewed and is accurate and complete Judi Saa, DO         Note: This dictation was prepared with Dragon dictation along with smaller phrase technology. Any transcriptional errors that result from this process are unintentional.

## 2023-07-21 ENCOUNTER — Encounter: Payer: Self-pay | Admitting: Family Medicine

## 2023-07-21 ENCOUNTER — Ambulatory Visit (INDEPENDENT_AMBULATORY_CARE_PROVIDER_SITE_OTHER): Payer: Medicaid Other | Admitting: Family Medicine

## 2023-07-21 ENCOUNTER — Ambulatory Visit: Payer: Medicaid Other | Admitting: Family Medicine

## 2023-07-21 VITALS — BP 118/78 | Ht <= 58 in | Wt 133.0 lb

## 2023-07-21 DIAGNOSIS — M9901 Segmental and somatic dysfunction of cervical region: Secondary | ICD-10-CM | POA: Diagnosis not present

## 2023-07-21 DIAGNOSIS — M9902 Segmental and somatic dysfunction of thoracic region: Secondary | ICD-10-CM | POA: Diagnosis not present

## 2023-07-21 DIAGNOSIS — M9908 Segmental and somatic dysfunction of rib cage: Secondary | ICD-10-CM | POA: Diagnosis not present

## 2023-07-21 DIAGNOSIS — G2589 Other specified extrapyramidal and movement disorders: Secondary | ICD-10-CM | POA: Diagnosis not present

## 2023-07-21 DIAGNOSIS — M9904 Segmental and somatic dysfunction of sacral region: Secondary | ICD-10-CM

## 2023-07-21 DIAGNOSIS — M9903 Segmental and somatic dysfunction of lumbar region: Secondary | ICD-10-CM | POA: Diagnosis not present

## 2023-07-21 NOTE — Patient Instructions (Signed)
Good to see you Good luck with the gaggle of nieces and nephews See me in 8 weeks

## 2023-07-21 NOTE — Assessment & Plan Note (Signed)
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  Likely responding well to osteopathic manipulation.  Follow-up again in 2 months no change in medication.

## 2023-07-29 ENCOUNTER — Ambulatory Visit: Payer: Medicaid Other

## 2023-08-01 ENCOUNTER — Ambulatory Visit: Payer: Medicaid Other | Admitting: Family Medicine

## 2023-08-11 ENCOUNTER — Ambulatory Visit (INDEPENDENT_AMBULATORY_CARE_PROVIDER_SITE_OTHER): Payer: Medicaid Other | Admitting: *Deleted

## 2023-08-11 DIAGNOSIS — L501 Idiopathic urticaria: Secondary | ICD-10-CM

## 2023-09-15 NOTE — Progress Notes (Signed)
 [name] [address] [place] [address] Phone: [phone] Subjective:   Krista [name], am serving as a scribe for Dr. Arthea Claudene.  I'm seeing this patient by the request  of:  Silver Lamar LABOR, MD  CC: Back and neck pain follow-up  [id number]  Krista [name] is a 43 y.o. female coming in with complaint of back and neck pain. OMT 07/21/2023. Patient states that in past month L scapula has become tight.   Medications patient has been prescribed:   Taking:         Reviewed prior external information including notes and imaging from previsou exam, outside providers and external EMR if available.   As well as notes that were available from care everywhere and other healthcare systems.  Past medical history, social, surgical and family history all reviewed in electronic medical record.  No pertanent information unless stated regarding to the chief complaint.   Past Medical History:  Diagnosis Date   Asthma    exercise induced   Complication of anesthesia    DVT (deep venous thrombosis) (HCC)    Family history of adverse reaction to anesthesia    PONV   Obesity    PONV (postoperative nausea and vomiting)    Urticaria     Allergies  Allergen Reactions   Decadron  [Dexamethasone ] Anaphylaxis   Medrol  [Methylprednisolone ] Anaphylaxis   Prednisone Hives, Itching, Swelling and Other (See Comments)    Lips and tongue swell     Chlorhexidine       Review of Systems:  No headache, visual changes, nausea, vomiting, diarrhea, constipation, dizziness, abdominal pain, skin rash, fevers, chills, night sweats, weight loss, swollen lymph nodes, body aches, joint swelling, chest pain, shortness of breath, mood changes. POSITIVE muscle aches  Objective  Blood pressure 108/78, pulse 64, height 4' 9 (1.448 m), weight 131 lb (59.4 kg), last menstrual period 09/06/2014, SpO2 95%, unknown if currently breastfeeding.   General: No  apparent distress alert and oriented x3 mood and affect normal, dressed appropriately.  HEENT: Pupils equal, extraocular movements intact  Respiratory: Patient's speak in full sentences and does not appear short of breath  Cardiovascular: No lower extremity edema, non tender, no erythema  MSK:  Back does have some loss lordosis noted.  Some tenderness to palpation in the paraspinal musculature.  Tightness with Deri pro.  Does have the hypermobility in other areas.  Significant tightness noted in the left parascapular area.  Negative Spurling.  Osteopathic findings  C2 flexed rotated and side bent left C6 flexed rotated and side bent left T3 extended rotated and side bent right inhaled rib T9 extended rotated and side bent left L2 flexed rotated and side bent right L3 F RS left  Sacrum right on right       Assessment and Plan:  Scapular dyskinesis Scapular dyskinesis noted.  Discussed which activities to do and which ones to avoid.  Increase activity slowly overall.  Discussed icing regimen.  Follow-up again in 6 to 8 weeks.  Patient has had a lot more recent stress and work is significantly more difficult at the moment.    Nonallopathic problems  Decision today to treat with OMT was based on Physical Exam  After verbal consent patient was treated with HVLA, ME, FPR techniques in cervical, rib, thoracic, lumbar, and sacral  areas  Patient tolerated the procedure well with improvement in symptoms  Patient given exercises, stretches and lifestyle modifications  See medications in patient instructions if  given  Patient will follow up in 4-6 weeks     The above documentation has been reviewed and is accurate and complete Arthea CHRISTELLA Sharps, DO         Note: This dictation was prepared with Dragon dictation along with smaller phrase technology. Any transcriptional errors that result from this process are unintentional.

## 2023-09-16 ENCOUNTER — Encounter: Payer: Self-pay | Admitting: Family Medicine

## 2023-09-16 ENCOUNTER — Ambulatory Visit (INDEPENDENT_AMBULATORY_CARE_PROVIDER_SITE_OTHER): Payer: Medicaid Other | Admitting: Family Medicine

## 2023-09-16 VITALS — BP 108/78 | HR 64 | Ht <= 58 in | Wt 131.0 lb

## 2023-09-16 DIAGNOSIS — M9908 Segmental and somatic dysfunction of rib cage: Secondary | ICD-10-CM

## 2023-09-16 DIAGNOSIS — M9904 Segmental and somatic dysfunction of sacral region: Secondary | ICD-10-CM

## 2023-09-16 DIAGNOSIS — G2589 Other specified extrapyramidal and movement disorders: Secondary | ICD-10-CM | POA: Diagnosis not present

## 2023-09-16 DIAGNOSIS — M9901 Segmental and somatic dysfunction of cervical region: Secondary | ICD-10-CM | POA: Diagnosis not present

## 2023-09-16 DIAGNOSIS — M9902 Segmental and somatic dysfunction of thoracic region: Secondary | ICD-10-CM | POA: Diagnosis not present

## 2023-09-16 DIAGNOSIS — M9903 Segmental and somatic dysfunction of lumbar region: Secondary | ICD-10-CM | POA: Diagnosis not present

## 2023-09-16 NOTE — Assessment & Plan Note (Signed)
 Scapular dyskinesis noted.  Discussed which activities to do and which ones to avoid.  Increase activity slowly overall.  Discussed icing regimen.  Follow-up again in 6 to 8 weeks.  Patient has had a lot more recent stress and work is significantly more difficult at the moment.

## 2023-09-16 NOTE — Patient Instructions (Signed)
 Good luck with all the sickos See me in 4-6 weeks

## 2023-09-22 ENCOUNTER — Ambulatory Visit: Payer: Medicaid Other

## 2023-09-22 DIAGNOSIS — L501 Idiopathic urticaria: Secondary | ICD-10-CM | POA: Diagnosis not present

## 2023-10-19 NOTE — Progress Notes (Signed)
 [name] [address] [place] [address] Phone: [phone] Subjective:   INadine Counts, am serving as a scribe for Dr. Antoine Primas.  I'm seeing this patient by the request  of:  Hadley Pen, MD  CC: Back and neck pain follow-up  [id number]  Krista [name] is a 43 y.o. female coming in with complaint of back and neck pain. OMT 09/16/2023. Patient states same per usual. No new concerns or changes.  Medications patient has been prescribed: None  Taking:         Reviewed prior external information including notes and imaging from previsou exam, outside providers and external EMR if available.   As well as notes that were available from care everywhere and other healthcare systems.  Past medical history, social, surgical and family history all reviewed in electronic medical record.  No pertanent information unless stated regarding to the chief complaint.   Past Medical History:  Diagnosis Date   Asthma    exercise induced   Complication of anesthesia    DVT (deep venous thrombosis) (HCC)    Family history of adverse reaction to anesthesia    PONV   Obesity    PONV (postoperative nausea and vomiting)    Urticaria     Allergies  Allergen Reactions   Decadron [Dexamethasone] Anaphylaxis   Medrol [Methylprednisolone] Anaphylaxis   Prednisone Hives, Itching, Swelling and Other (See Comments)    Lips and tongue swell     Chlorhexidine      Review of Systems:  No headache, visual changes, nausea, vomiting, diarrhea, constipation, dizziness, abdominal pain, skin rash, fevers, chills, night sweats, weight loss, swollen lymph nodes, body aches, joint swelling, chest pain, shortness of breath, mood changes. POSITIVE muscle aches  Objective  Blood pressure 108/72, pulse 72, height 4\' 9"  (1.448 m), weight 133 lb (60.3 kg), last menstrual period 09/06/2014, SpO2 96%, unknown if currently breastfeeding.   General: No  apparent distress alert and oriented x3 mood and affect normal, dressed appropriately.  HEENT: Pupils equal, extraocular movements intact  Respiratory: Patient's speak in full sentences and does not appear short of breath  Cardiovascular: No lower extremity edema, non tender, no erythema  Gait MSK:  Back does have some loss lordosis noted.  Some tightness noted in the parascapular area and seems to be bilateral.  Patient's neck does have some limited sidebending bilaterally.  Patient has negative Spurling's noted.  Osteopathic findings  C2 flexed rotated and side bent right C6 flexed rotated and side bent left T3 extended rotated and side bent right inhaled rib T5 extended rotated and side bent left inhaled L2 flexed rotated and side bent right Sacrum right on right     Assessment and Plan:  No problem-specific Assessment & Plan notes found for this encounter.    Nonallopathic problems  Decision today to treat with OMT was based on Physical Exam  After verbal consent patient was treated with HVLA, ME, FPR techniques in cervical, rib, thoracic, lumbar, and sacral  areas  Patient tolerated the procedure well with improvement in symptoms  Patient given exercises, stretches and lifestyle modifications  See medications in patient instructions if given  Patient will follow up in 4-8 weeks    The above documentation has been reviewed and is accurate and complete Judi Saa, DO          Note: This dictation was prepared with Dragon dictation along with smaller phrase technology. Any transcriptional errors that result  from this process are unintentional.

## 2023-10-25 ENCOUNTER — Ambulatory Visit (INDEPENDENT_AMBULATORY_CARE_PROVIDER_SITE_OTHER): Payer: Medicaid Other | Admitting: Family Medicine

## 2023-10-25 VITALS — BP 108/72 | HR 72 | Ht <= 58 in | Wt 133.0 lb

## 2023-10-25 DIAGNOSIS — G2589 Other specified extrapyramidal and movement disorders: Secondary | ICD-10-CM

## 2023-10-25 DIAGNOSIS — M9904 Segmental and somatic dysfunction of sacral region: Secondary | ICD-10-CM

## 2023-10-25 DIAGNOSIS — M9903 Segmental and somatic dysfunction of lumbar region: Secondary | ICD-10-CM

## 2023-10-25 DIAGNOSIS — M9908 Segmental and somatic dysfunction of rib cage: Secondary | ICD-10-CM | POA: Diagnosis not present

## 2023-10-25 DIAGNOSIS — M9902 Segmental and somatic dysfunction of thoracic region: Secondary | ICD-10-CM

## 2023-10-25 DIAGNOSIS — M9901 Segmental and somatic dysfunction of cervical region: Secondary | ICD-10-CM | POA: Diagnosis not present

## 2023-10-25 NOTE — Patient Instructions (Addendum)
 Good luck with kiddos and sleeping arrangements See you again in 2 months

## 2023-10-25 NOTE — Assessment & Plan Note (Signed)
 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  Increase activity slowly.  Discussed icing regimen.  No other changes in medication.  Follow-up again in 6 to 8 weeks

## 2023-11-02 ENCOUNTER — Encounter: Payer: Self-pay | Admitting: Allergy

## 2023-11-03 NOTE — Telephone Encounter (Signed)
 Per Provider:  That works.    Willa Rough can you adjust the schedule and she'll get next dose at OV.    Forwarding message to injection room assistant.

## 2023-11-04 ENCOUNTER — Ambulatory Visit: Payer: Medicaid Other

## 2023-11-23 ENCOUNTER — Ambulatory Visit: Payer: Medicaid Other | Admitting: Allergy

## 2023-11-23 ENCOUNTER — Ambulatory Visit

## 2023-11-23 ENCOUNTER — Encounter: Payer: Self-pay | Admitting: Allergy

## 2023-11-23 ENCOUNTER — Other Ambulatory Visit: Payer: Self-pay

## 2023-11-23 VITALS — BP 110/70 | HR 79 | Temp 98.5°F | Resp 18

## 2023-11-23 DIAGNOSIS — T782XXD Anaphylactic shock, unspecified, subsequent encounter: Secondary | ICD-10-CM | POA: Diagnosis not present

## 2023-11-23 DIAGNOSIS — L501 Idiopathic urticaria: Secondary | ICD-10-CM

## 2023-11-23 DIAGNOSIS — J3089 Other allergic rhinitis: Secondary | ICD-10-CM | POA: Diagnosis not present

## 2023-11-23 MED ORDER — OMALIZUMAB 150 MG/ML ~~LOC~~ SOSY
150.0000 mg | PREFILLED_SYRINGE | SUBCUTANEOUS | Status: AC
  Administered 2023-11-23: 150 mg via SUBCUTANEOUS

## 2023-11-23 NOTE — Progress Notes (Signed)
 Follow-up Note  RE: Krista [name] MRN: [id number] DOB: December 04, 1980 Date of Office Visit: 11/23/2023   History of present illness: Krista [name] is a 43 y.o. female presenting today for follow-up of urticaria with anaphylaxis secondary to steroid use as well as allergic rhinitis.  She was last seen in the office on 05/27/2019 for myself. Discussed the use of AI scribe software for clinical note transcription with the patient, who gave verbal consent to proceed.  She has been receiving Xolair injections for urticaria, with her last dose of 300 mg administered approximately two months ago. She requested to extend the interval to eight weeks, which was achieved without any recurrence of hives or itching.  She is hoping she will be able to wean off of Xolair soon.  She is not currently taking any oral medications for hive control anymore, having previously discontinued Pepcid and Xyzal. She carries an epinephrine device at all times for emergencies.  She also at this time does not report any environmental allergy symptoms.  She is doing well at this time.   Review of systems: 10pt ROS negative unless noted above in HPI  Past medical/social/surgical/family history have been reviewed and are unchanged unless specifically indicated below.  No changes  Medication List: Current Outpatient Medications  Medication Sig Dispense Refill   albuterol (PROVENTIL) (2.5 MG/3ML) 0.083% nebulizer solution Take 3 mLs (2.5 mg total) by nebulization every 4 (four) hours as needed for wheezing or shortness of breath.     buPROPion (WELLBUTRIN XL) 300 MG 24 hr tablet Take 300 mg by mouth daily.     Crisaborole (EUCRISA) 2 % OINT Apply 1 Application topically 2 (two) times daily as needed (mild rash). 60 g 1   EPINEPHrine (EPIPEN 2-PAK) 0.3 mg/0.3 mL IJ SOAJ injection Inject 0.3 mg into the muscle as needed for anaphylaxis.     EPINEPHrine (EPIPEN 2-PAK) 0.3 mg/0.3 mL IJ SOAJ injection Inject 0.3 mg into the  muscle as needed for anaphylaxis. 0.3 mL 1   famotidine (PEPCID) 20-0.9 MG/50ML-% Inject 50 mLs (20 mg total) into the vein every 12 (twelve) hours.     LOMAIRA 8 MG TABS Take by mouth.     LORazepam (ATIVAN) 0.5 MG tablet Take by mouth.     omalizumab Geoffry Paradise) 150 MG/ML prefilled syringe Inject 300 mg into the skin every 28 (twenty-eight) days. 2 mL 11   omeprazole (PRILOSEC) 40 MG capsule Take 40 mg by mouth daily.     phenazopyridine (PYRIDIUM) 200 MG tablet Take 200 mg by mouth 4 (four) times daily.     topiramate (TOPAMAX) 50 MG tablet Take by mouth.     estradiol (ESTRACE) 1 MG tablet Take 1 mg by mouth daily.     Current Facility-Administered Medications  Medication Dose Route Frequency Provider Last Rate Last Admin   omalizumab Geoffry Paradise) prefilled syringe 150 mg  150 mg Subcutaneous Q8 Weeks Marcelyn Bruins, MD   150 mg at 11/23/23 1600   omalizumab Geoffry Paradise) prefilled syringe 300 mg  300 mg Subcutaneous Q28 days Marcelyn Bruins, MD   300 mg at 09/22/23 1435     Known medication allergies: Allergies  Allergen Reactions   Decadron [Dexamethasone] Anaphylaxis   Medrol [Methylprednisolone] Anaphylaxis   Prednisone Hives, Itching, Swelling and Other (See Comments)    Lips and tongue swell     Chlorhexidine      Physical examination: Blood pressure 110/70, pulse 79, temperature 98.5 F (36.9 C), temperature source Temporal, resp. rate  18, last menstrual period 09/06/2014, SpO2 100%, unknown if currently breastfeeding.  General: Alert, interactive, in no acute distress. HEENT: PERRLA, TMs pearly gray, turbinates non-edematous without discharge, post-pharynx non erythematous. Neck: Supple without lymphadenopathy. Lungs: Clear to auscultation without wheezing, rhonchi or rales. {no increased work of breathing. CV: Normal S1, S2 without murmurs. Abdomen: Nondistended, nontender. Skin: Warm and dry, without lesions or rashes. Extremities:  No clubbing, cyanosis  or edema. Neuro:   Grossly intact.  Diagnositics/Labs: Xolair injection 150 mg given today in office.  Assessment and plan: Urticaria-improved/controlled Anaphylaxis secondary to steroid  - Will continue medication regimen wean: Decrease today to Xolair 150 mg every 8-week intervals.  She will receive 1 further 150 mg dosing in 8 weeks and if she continues to do well after this this will be her last Xolair injection.    - At this time she is not needing any oral medications.  If hives were to resume then we would do Xolair dosing.  If further medication is needed after that we will reinitiate Xyzal and Pepcid dosing - continue avoidance of steroid medications in all forms, oral, topical, injectables at this times.  Discussed that she may have an allergy to the incipients of the use of steroid medications and may not directly be allergic to the steroid itself.  To fully now determine if she can tolerate these medications again would want her to be off of Xolair.   - have access to self-injectable epinephrine (Epipen or AuviQ 0.3mg ) or Neffy at all times.   Discussed today the new nasal epinephrine device with her and she is interested in this option and we will send this in and see if it is covered. - follow emergency action plan in case of allergic reaction  Allergic rhinitis - appears to be well-controlled at this time without antihistamines.  If needed can use Xyzal daily dosing for control  Follow-up in 6 months or sooner if needed  No follow-ups on file.  I appreciate the opportunity to take part in Krista [name]'s care. Please do not hesitate to contact me with questions.  Sincerely,   Catha Clink, MD Allergy/Immunology [facility]

## 2023-11-23 NOTE — Patient Instructions (Addendum)
 Urticaria-improved/controlled Anaphylaxis secondary to steroid  - Will continue medication regimen wean: Decrease today to Xolair 150 mg every 8-week intervals.  She will receive 1 further 150 mg dosing in 8 weeks and if she continues to do well after this this will be her last Xolair injection.    - At this time she is not needing any oral medications.  If hives were to resume then we would do Xolair dosing.  If further medication is needed after that we will reinitiate Xyzal and Pepcid dosing - continue avoidance of steroid medications in all forms, oral, topical, injectables at this times.  Discussed that she may have an allergy to the incipients of the use of steroid medications and may not directly be allergic to the steroid itself.  To fully now determine if she can tolerate these medications again would want her to be off of Xolair.   - have access to self-injectable epinephrine (Epipen or AuviQ 0.3mg ) or Neffy at all times.   Discussed today the new nasal epinephrine device with her and she is interested in this option and we will send this in and see if it is covered. - follow emergency action plan in case of allergic reaction  Allergic rhinitis - appears to be well-controlled at this time without antihistamines.  If needed can use Xyzal daily dosing for control  Follow-up in 6 months or sooner if needed

## 2023-11-24 MED ORDER — NEFFY 2 MG/0.1ML NA SOLN: 1.0000 | NASAL | 1 refills | Status: DC | PRN

## 2023-11-24 NOTE — Addendum Note (Signed)
 Addended by: Gizzelle Lacomb P on: 11/24/2023 08:35 AM   Modules accepted: Orders

## 2023-12-23 NOTE — Progress Notes (Signed)
 [name] [address] [place] [address] Phone: [phone] Subjective:   Krista [name], am serving as a scribe for Dr. Ronnell [name].  I'm seeing this patient by the request  of:  Krista Stabile, MD  CC: Back and neck pain follow-up  [id number]  Krista [name] is a 43 y.o. female coming in with complaint of back and neck pain. OMT 10/25/2023. Patient states doing well. No new symptoms.  Medications patient has been prescribed: None  Taking:         Reviewed prior external information including notes and imaging from previsou exam, outside providers and external EMR if available.   As well as notes that were available from care everywhere and other healthcare systems.  Past medical history, social, surgical and family history all reviewed in electronic medical record.  No pertanent information unless stated regarding to the chief complaint.   Past Medical History:  Diagnosis Date   Asthma    exercise induced   Complication of anesthesia    DVT (deep venous thrombosis) (HCC)    Family history of adverse reaction to anesthesia    PONV   Obesity    PONV (postoperative nausea and vomiting)    Urticaria     Allergies  Allergen Reactions   Decadron [Dexamethasone] Anaphylaxis   Medrol  [Methylprednisolone ] Anaphylaxis   Prednisone Hives, Itching, Swelling and Other (See Comments)    Lips and tongue swell     Chlorhexidine       Review of Systems:  No headache, visual changes, nausea, vomiting, diarrhea, constipation, dizziness, abdominal pain, skin rash, fevers, chills, night sweats, weight loss, swollen lymph nodes, body aches, joint swelling, chest pain, shortness of breath, mood changes. POSITIVE muscle aches  Objective  Blood pressure 114/72, pulse 98, height 4\' 9"  (1.448 m), weight 132 lb (59.9 kg), last menstrual period 09/06/2014, SpO2 98%, unknown if currently breastfeeding.   General: No apparent distress  alert and oriented x3 mood and affect normal, dressed appropriately.  HEENT: Pupils equal, extraocular movements intact  Respiratory: Patient's speak in full sentences and does not appear short of breath  Cardiovascular: No lower extremity edema, non tender, no erythema  Gait relatively normal MSK:  Back loss lordosis noted.  No still significant tightness noted in the left parascapular area.  Seems to be more of the levator scapula as well as the rhomboids.  Significant tightness noted with some loss of left-sided sidebending and right sided rotation.  Osteopathic findings  C2 flexed rotated and side bent right C6 flexed rotated and side bent left T3 extended rotated and side bent left inhaled rib T6 extended rotated and side bent left inhaled rib L2 flexed rotated and side bent right Sacrum right on right       Assessment and Plan:  Scapular dyskinesis Patient has been doing much more repetitive activities recently.  Likely contributing.  Discussed icing regimen and home exercises, discussed which activities to do and which ones to avoid.  Increase activity slowly.  Discussed icing regimen.  Follow-up again in 6 to 8 weeks.    Nonallopathic problems  Decision today to treat with OMT was based on Physical Exam  After verbal consent patient was treated with HVLA, ME, FPR techniques in cervical, rib, thoracic, lumbar, and sacral  areas avoided HVLA on the neck  Patient tolerated the procedure well with improvement in symptoms  Patient given exercises, stretches and lifestyle modifications  See medications in patient instructions if given  Patient will follow up in 4-8 weeks    The above documentation has been reviewed and is accurate and complete Krista Margo, DO          Note: This dictation was prepared with Dragon dictation along with smaller phrase technology. Any transcriptional errors that result from this process are unintentional.

## 2023-12-27 ENCOUNTER — Ambulatory Visit (INDEPENDENT_AMBULATORY_CARE_PROVIDER_SITE_OTHER): Admitting: Family Medicine

## 2023-12-27 ENCOUNTER — Encounter: Payer: Self-pay | Admitting: Family Medicine

## 2023-12-27 VITALS — BP 114/72 | HR 98 | Ht <= 58 in | Wt 132.0 lb

## 2023-12-27 DIAGNOSIS — M9908 Segmental and somatic dysfunction of rib cage: Secondary | ICD-10-CM | POA: Diagnosis not present

## 2023-12-27 DIAGNOSIS — M9901 Segmental and somatic dysfunction of cervical region: Secondary | ICD-10-CM

## 2023-12-27 DIAGNOSIS — G2589 Other specified extrapyramidal and movement disorders: Secondary | ICD-10-CM | POA: Diagnosis not present

## 2023-12-27 DIAGNOSIS — M9904 Segmental and somatic dysfunction of sacral region: Secondary | ICD-10-CM | POA: Diagnosis not present

## 2023-12-27 DIAGNOSIS — M9902 Segmental and somatic dysfunction of thoracic region: Secondary | ICD-10-CM

## 2023-12-27 DIAGNOSIS — M9903 Segmental and somatic dysfunction of lumbar region: Secondary | ICD-10-CM | POA: Diagnosis not present

## 2023-12-27 NOTE — Patient Instructions (Signed)
 Good to see you! For hubby consider: 2g fish oil daily  400mg  CoQ10 500mg  choline See you again in 2 months

## 2023-12-27 NOTE — Assessment & Plan Note (Signed)
 Patient has been doing much more repetitive activities recently.  Likely contributing.  Discussed icing regimen and home exercises, discussed which activities to do and which ones to avoid.  Increase activity slowly.  Discussed icing regimen.  Follow-up again in 6 to 8 weeks.

## 2024-01-13 ENCOUNTER — Encounter: Payer: Self-pay | Admitting: Allergy

## 2024-01-20 ENCOUNTER — Ambulatory Visit

## 2024-01-24 ENCOUNTER — Encounter (HOSPITAL_COMMUNITY): Payer: Self-pay | Admitting: Internal Medicine

## 2024-01-24 ENCOUNTER — Encounter (HOSPITAL_COMMUNITY): Admission: EM | Disposition: A | Discharge status: Home / Self Care | Payer: Self-pay | Source: Home / Self Care

## 2024-01-24 ENCOUNTER — Inpatient Hospital Stay (HOSPITAL_COMMUNITY): Admitting: Certified Registered Nurse Anesthetist

## 2024-01-24 ENCOUNTER — Emergency Department (HOSPITAL_COMMUNITY)

## 2024-01-24 ENCOUNTER — Inpatient Hospital Stay (HOSPITAL_COMMUNITY)
Admission: EM | Admit: 2024-01-24 | Discharge: 2024-01-27 | DRG: 337 | Disposition: A | Discharge status: Home / Self Care

## 2024-01-24 ENCOUNTER — Other Ambulatory Visit: Payer: Self-pay

## 2024-01-24 ENCOUNTER — Inpatient Hospital Stay (HOSPITAL_COMMUNITY)

## 2024-01-24 DIAGNOSIS — Z79899 Other long term (current) drug therapy: Secondary | ICD-10-CM

## 2024-01-24 DIAGNOSIS — K56609 Unspecified intestinal obstruction, unspecified as to partial versus complete obstruction: Secondary | ICD-10-CM | POA: Diagnosis present

## 2024-01-24 DIAGNOSIS — Z888 Allergy status to other drugs, medicaments and biological substances status: Secondary | ICD-10-CM | POA: Diagnosis not present

## 2024-01-24 DIAGNOSIS — Z90710 Acquired absence of both cervix and uterus: Secondary | ICD-10-CM | POA: Diagnosis not present

## 2024-01-24 DIAGNOSIS — Z9049 Acquired absence of other specified parts of digestive tract: Secondary | ICD-10-CM | POA: Diagnosis not present

## 2024-01-24 DIAGNOSIS — Z885 Allergy status to narcotic agent status: Secondary | ICD-10-CM

## 2024-01-24 DIAGNOSIS — K565 Intestinal adhesions [bands], unspecified as to partial versus complete obstruction: Principal | ICD-10-CM | POA: Diagnosis present

## 2024-01-24 DIAGNOSIS — Z833 Family history of diabetes mellitus: Secondary | ICD-10-CM

## 2024-01-24 DIAGNOSIS — Z5331 Laparoscopic surgical procedure converted to open procedure: Secondary | ICD-10-CM

## 2024-01-24 DIAGNOSIS — Z86718 Personal history of other venous thrombosis and embolism: Secondary | ICD-10-CM | POA: Diagnosis not present

## 2024-01-24 DIAGNOSIS — Z9884 Bariatric surgery status: Secondary | ICD-10-CM

## 2024-01-24 DIAGNOSIS — K9189 Other postprocedural complications and disorders of digestive system: Secondary | ICD-10-CM | POA: Diagnosis present

## 2024-01-24 DIAGNOSIS — Z8249 Family history of ischemic heart disease and other diseases of the circulatory system: Secondary | ICD-10-CM

## 2024-01-24 DIAGNOSIS — J45909 Unspecified asthma, uncomplicated: Secondary | ICD-10-CM | POA: Diagnosis present

## 2024-01-24 HISTORY — PX: LAPAROSCOPY: SHX197

## 2024-01-24 LAB — LIPASE, BLOOD: Lipase: 37 U/L (ref 11–51)

## 2024-01-24 LAB — CBC
HCT: 43.8 % (ref 36.0–46.0)
Hemoglobin: 14.1 g/dL (ref 12.0–15.0)
MCH: 31 pg (ref 26.0–34.0)
MCHC: 32.2 g/dL (ref 30.0–36.0)
MCV: 96.3 fL (ref 80.0–100.0)
Platelets: 297 10*3/uL (ref 150–400)
RBC: 4.55 MIL/uL (ref 3.87–5.11)
RDW: 12.7 % (ref 11.5–15.5)
WBC: 8.5 10*3/uL (ref 4.0–10.5)
nRBC: 0 % (ref 0.0–0.2)

## 2024-01-24 LAB — URINALYSIS, ROUTINE W REFLEX MICROSCOPIC
Bilirubin Urine: NEGATIVE
Glucose, UA: NEGATIVE mg/dL
Hgb urine dipstick: NEGATIVE
Ketones, ur: 20 mg/dL — AB
Leukocytes,Ua: NEGATIVE
Nitrite: NEGATIVE
Protein, ur: NEGATIVE mg/dL
Specific Gravity, Urine: 1.015 (ref 1.005–1.030)
pH: 6 (ref 5.0–8.0)

## 2024-01-24 LAB — COMPREHENSIVE METABOLIC PANEL WITH GFR
ALT: 13 U/L (ref 0–44)
AST: 21 U/L (ref 15–41)
Albumin: 4 g/dL (ref 3.5–5.0)
Alkaline Phosphatase: 46 U/L (ref 38–126)
Anion gap: 11 (ref 5–15)
BUN: 14 mg/dL (ref 6–20)
CO2: 23 mmol/L (ref 22–32)
Calcium: 9.3 mg/dL (ref 8.9–10.3)
Chloride: 105 mmol/L (ref 98–111)
Creatinine, Ser: 0.73 mg/dL (ref 0.44–1.00)
GFR, Estimated: >60 mL/min (ref >60)
Glucose, Bld: 115 mg/dL — ABNORMAL HIGH (ref 70–99)
Potassium: 4.3 mmol/L (ref 3.5–5.1)
Sodium: 139 mmol/L (ref 135–145)
Total Bilirubin: 1.7 mg/dL — ABNORMAL HIGH (ref 0.0–1.2)
Total Protein: 7.3 g/dL (ref 6.5–8.1)

## 2024-01-24 LAB — TYPE AND SCREEN
ABO/RH(D): A POS
Antibody Screen: NEGATIVE

## 2024-01-24 SURGERY — LAPAROSCOPY, DIAGNOSTIC
Anesthesia: General

## 2024-01-24 MED ORDER — PROPOFOL 10 MG/ML IV BOLUS
INTRAVENOUS | Status: AC
  Filled 2024-01-24: qty 20

## 2024-01-24 MED ORDER — HYDROMORPHONE HCL 1 MG/ML IJ SOLN
0.5000 mg | INTRAMUSCULAR | Status: DC | PRN
  Administered 2024-01-24 – 2024-01-25 (×2): 0.5 mg via INTRAVENOUS
  Filled 2024-01-24 (×2): qty 0.5

## 2024-01-24 MED ORDER — OXYCODONE HCL 5 MG PO TABS: 5.0000 mg | ORAL_TABLET | Freq: Once | ORAL | Status: DC | PRN

## 2024-01-24 MED ORDER — ONDANSETRON HCL 4 MG/2ML IJ SOLN
4.0000 mg | Freq: Once | INTRAMUSCULAR | Status: AC
  Administered 2024-01-24: 4 mg via INTRAVENOUS
  Filled 2024-01-24: qty 2

## 2024-01-24 MED ORDER — MORPHINE SULFATE (PF) 4 MG/ML IV SOLN
4.0000 mg | Freq: Once | INTRAVENOUS | Status: AC
  Administered 2024-01-24: 4 mg via INTRAVENOUS
  Filled 2024-01-24: qty 1

## 2024-01-24 MED ORDER — CHLORHEXIDINE GLUCONATE 0.12 % MT SOLN: 15.0000 mL | Freq: Once | OROMUCOSAL | Status: DC

## 2024-01-24 MED ORDER — ENOXAPARIN SODIUM 40 MG/0.4ML IJ SOSY: 40.0000 mg | PREFILLED_SYRINGE | INTRAMUSCULAR | Status: DC

## 2024-01-24 MED ORDER — LIDOCAINE 2% (20 MG/ML) 5 ML SYRINGE
INTRAMUSCULAR | Status: DC | PRN
  Administered 2024-01-24: 60 mg via INTRAVENOUS

## 2024-01-24 MED ORDER — ACETAMINOPHEN 325 MG PO TABS: 650.0000 mg | ORAL_TABLET | Freq: Four times a day (QID) | ORAL | Status: DC | PRN

## 2024-01-24 MED ORDER — MIDAZOLAM HCL 2 MG/2ML IJ SOLN
INTRAMUSCULAR | Status: AC
  Filled 2024-01-24: qty 2

## 2024-01-24 MED ORDER — PROPOFOL 500 MG/50ML IV EMUL
INTRAVENOUS | Status: DC | PRN
  Administered 2024-01-24: 25 ug/kg/min via INTRAVENOUS

## 2024-01-24 MED ORDER — ACETAMINOPHEN 650 MG RE SUPP: 650.0000 mg | Freq: Four times a day (QID) | RECTAL | Status: DC | PRN

## 2024-01-24 MED ORDER — HYDROMORPHONE HCL 1 MG/ML IJ SOLN
INTRAMUSCULAR | Status: DC | PRN
  Administered 2024-01-24: .5 mg via INTRAVENOUS

## 2024-01-24 MED ORDER — ONDANSETRON HCL 4 MG/2ML IJ SOLN: 4.0000 mg | Freq: Four times a day (QID) | INTRAMUSCULAR | Status: DC | PRN

## 2024-01-24 MED ORDER — ENOXAPARIN SODIUM 40 MG/0.4ML IJ SOSY
40.0000 mg | PREFILLED_SYRINGE | INTRAMUSCULAR | Status: DC
  Administered 2024-01-25 – 2024-01-27 (×3): 40 mg via SUBCUTANEOUS
  Filled 2024-01-24 (×3): qty 0.4

## 2024-01-24 MED ORDER — LACTATED RINGERS IV SOLN: INTRAVENOUS | Status: DC

## 2024-01-24 MED ORDER — OXYCODONE HCL 5 MG/5ML PO SOLN: 5.0000 mg | Freq: Once | ORAL | Status: DC | PRN

## 2024-01-24 MED ORDER — HYDROMORPHONE HCL 2 MG/ML IJ SOLN
INTRAMUSCULAR | Status: AC
  Filled 2024-01-24: qty 1

## 2024-01-24 MED ORDER — SCOPOLAMINE 1 MG/3DAYS TD PT72
1.0000 | MEDICATED_PATCH | TRANSDERMAL | Status: DC
  Administered 2024-01-24: 1.5 mg via TRANSDERMAL
  Filled 2024-01-24: qty 1

## 2024-01-24 MED ORDER — ROCURONIUM BROMIDE 10 MG/ML (PF) SYRINGE
PREFILLED_SYRINGE | INTRAVENOUS | Status: AC
  Filled 2024-01-24: qty 10

## 2024-01-24 MED ORDER — BUPIVACAINE-EPINEPHRINE 0.25% -1:200000 IJ SOLN
INTRAMUSCULAR | Status: DC | PRN
Start: 2024-01-24 — End: 2024-01-24
  Administered 2024-01-24: 30 mL

## 2024-01-24 MED ORDER — FENTANYL CITRATE (PF) 100 MCG/2ML IJ SOLN
INTRAMUSCULAR | Status: AC
  Filled 2024-01-24: qty 2

## 2024-01-24 MED ORDER — ROCURONIUM BROMIDE 10 MG/ML (PF) SYRINGE
PREFILLED_SYRINGE | INTRAVENOUS | Status: DC | PRN
  Administered 2024-01-24: 40 mg via INTRAVENOUS
  Administered 2024-01-24: 20 mg via INTRAVENOUS

## 2024-01-24 MED ORDER — ONDANSETRON HCL 4 MG/2ML IJ SOLN
INTRAMUSCULAR | Status: AC
  Filled 2024-01-24: qty 2

## 2024-01-24 MED ORDER — CEFAZOLIN SODIUM-DEXTROSE 2-4 GM/100ML-% IV SOLN
2.0000 g | Freq: Once | INTRAVENOUS | Status: AC
  Administered 2024-01-24: 2 g via INTRAVENOUS
  Filled 2024-01-24: qty 100

## 2024-01-24 MED ORDER — SODIUM CHLORIDE 0.9 % IV SOLN: INTRAVENOUS | Status: DC

## 2024-01-24 MED ORDER — AMISULPRIDE (ANTIEMETIC) 5 MG/2ML IV SOLN: 10.0000 mg | Freq: Once | INTRAVENOUS | Status: DC | PRN

## 2024-01-24 MED ORDER — PHENYLEPHRINE HCL-NACL 20-0.9 MG/250ML-% IV SOLN
INTRAVENOUS | Status: DC | PRN
  Administered 2024-01-24: 25 ug/min via INTRAVENOUS

## 2024-01-24 MED ORDER — DEXAMETHASONE SODIUM PHOSPHATE 10 MG/ML IJ SOLN
INTRAMUSCULAR | Status: AC
  Filled 2024-01-24: qty 1

## 2024-01-24 MED ORDER — FENTANYL CITRATE PF 50 MCG/ML IJ SOSY: 25.0000 ug | PREFILLED_SYRINGE | INTRAMUSCULAR | Status: DC | PRN

## 2024-01-24 MED ORDER — ORAL CARE MOUTH RINSE
15.0000 mL | Freq: Once | OROMUCOSAL | Status: AC
  Administered 2024-01-24: 15 mL via OROMUCOSAL

## 2024-01-24 MED ORDER — MORPHINE SULFATE (PF) 2 MG/ML IV SOLN
2.0000 mg | INTRAVENOUS | Status: DC | PRN
  Administered 2024-01-24: 2 mg via INTRAVENOUS
  Filled 2024-01-24: qty 1

## 2024-01-24 MED ORDER — ARTIFICIAL TEARS OPHTHALMIC OINT
TOPICAL_OINTMENT | OPHTHALMIC | Status: AC
  Filled 2024-01-24: qty 3.5

## 2024-01-24 MED ORDER — ACETAMINOPHEN 10 MG/ML IV SOLN
INTRAVENOUS | Status: AC
  Filled 2024-01-24: qty 100

## 2024-01-24 MED ORDER — SUCCINYLCHOLINE CHLORIDE 200 MG/10ML IV SOSY
PREFILLED_SYRINGE | INTRAVENOUS | Status: AC
  Filled 2024-01-24: qty 10

## 2024-01-24 MED ORDER — METHOCARBAMOL 1000 MG/10ML IJ SOLN
500.0000 mg | Freq: Four times a day (QID) | INTRAMUSCULAR | Status: DC | PRN
  Administered 2024-01-24 – 2024-01-25 (×3): 500 mg via INTRAVENOUS
  Filled 2024-01-24 (×3): qty 10

## 2024-01-24 MED ORDER — SUCCINYLCHOLINE CHLORIDE 200 MG/10ML IV SOSY
PREFILLED_SYRINGE | INTRAVENOUS | Status: DC | PRN
  Administered 2024-01-24: 60 mg via INTRAVENOUS

## 2024-01-24 MED ORDER — IOHEXOL 300 MG/ML  SOLN
100.0000 mL | Freq: Once | INTRAMUSCULAR | Status: AC | PRN
  Administered 2024-01-24: 100 mL via INTRAVENOUS

## 2024-01-24 MED ORDER — ONDANSETRON HCL 4 MG/2ML IJ SOLN
INTRAMUSCULAR | Status: DC | PRN
  Administered 2024-01-24: 4 mg via INTRAVENOUS

## 2024-01-24 MED ORDER — 0.9 % SODIUM CHLORIDE (POUR BTL) OPTIME
TOPICAL | Status: DC | PRN
  Administered 2024-01-24: 1000 mL

## 2024-01-24 MED ORDER — PANTOPRAZOLE SODIUM 40 MG IV SOLR
40.0000 mg | Freq: Once | INTRAVENOUS | Status: AC
  Administered 2024-01-24: 40 mg via INTRAVENOUS
  Filled 2024-01-24: qty 10

## 2024-01-24 MED ORDER — LIDOCAINE HCL (PF) 2 % IJ SOLN
INTRAMUSCULAR | Status: AC
  Filled 2024-01-24: qty 5

## 2024-01-24 MED ORDER — BUPIVACAINE-EPINEPHRINE (PF) 0.25% -1:200000 IJ SOLN
INTRAMUSCULAR | Status: AC
  Filled 2024-01-24: qty 30

## 2024-01-24 MED ORDER — ACETAMINOPHEN 10 MG/ML IV SOLN
INTRAVENOUS | Status: DC | PRN
Start: 2024-01-24 — End: 2024-01-24
  Administered 2024-01-24: 1000 mg via INTRAVENOUS

## 2024-01-24 MED ORDER — MIDAZOLAM HCL 2 MG/2ML IJ SOLN
INTRAMUSCULAR | Status: DC | PRN
  Administered 2024-01-24: 2 mg via INTRAVENOUS

## 2024-01-24 MED ORDER — SUGAMMADEX SODIUM 200 MG/2ML IV SOLN
INTRAVENOUS | Status: DC | PRN
  Administered 2024-01-24: 120 mg via INTRAVENOUS

## 2024-01-24 MED ORDER — FENTANYL CITRATE (PF) 250 MCG/5ML IJ SOLN
INTRAMUSCULAR | Status: DC | PRN
  Administered 2024-01-24: 50 ug via INTRAVENOUS
  Administered 2024-01-24 (×4): 25 ug via INTRAVENOUS
  Administered 2024-01-24: 50 ug via INTRAVENOUS

## 2024-01-24 MED ORDER — KETOROLAC TROMETHAMINE 30 MG/ML IJ SOLN: 30.0000 mg | Freq: Once | INTRAMUSCULAR | Status: DC | PRN

## 2024-01-24 MED ORDER — PROPOFOL 10 MG/ML IV BOLUS
INTRAVENOUS | Status: DC | PRN
  Administered 2024-01-24: 150 mg via INTRAVENOUS

## 2024-01-24 MED ORDER — OXYCODONE HCL 5 MG PO TABS
5.0000 mg | ORAL_TABLET | ORAL | Status: DC | PRN
  Administered 2024-01-25: 5 mg via ORAL
  Filled 2024-01-24: qty 1

## 2024-01-24 SURGICAL SUPPLY — 48 items
BAG COUNTER SPONGE SURGICOUNT (BAG) IMPLANT
BLADE EXTENDED COATED 6.5IN (ELECTRODE) IMPLANT
BLADE SURG SZ10 CARB STEEL (BLADE) IMPLANT
CELLS DAT CNTRL 66122 CELL SVR (MISCELLANEOUS) IMPLANT
CHLORAPREP W/TINT 26 (MISCELLANEOUS) ×2 IMPLANT
CLIP APPLIE 5 13 M/L LIGAMAX5 (MISCELLANEOUS) IMPLANT
CLIP APPLIE ROT 10 11.4 M/L (STAPLE) IMPLANT
COVER MAYO STAND STRL (DRAPES) IMPLANT
COVER SURGICAL LIGHT HANDLE (MISCELLANEOUS) ×2 IMPLANT
DERMABOND ADVANCED .7 DNX12 (GAUZE/BANDAGES/DRESSINGS) IMPLANT
DRAPE WARM FLUID 44X44 (DRAPES) IMPLANT
DRSG OPSITE POSTOP 4X8 (GAUZE/BANDAGES/DRESSINGS) IMPLANT
ELECT PENCIL ROCKER SW 15FT (MISCELLANEOUS) IMPLANT
ELECT REM PT RETURN 15FT ADLT (MISCELLANEOUS) ×2 IMPLANT
GAUZE SPONGE 4X4 12PLY STRL (GAUZE/BANDAGES/DRESSINGS) ×2 IMPLANT
GOWN STRL REUS W/ TWL LRG LVL3 (GOWN DISPOSABLE) ×2 IMPLANT
GOWN STRL REUS W/ TWL XL LVL3 (GOWN DISPOSABLE) ×2 IMPLANT
HANDLE SUCTION POOLE (INSTRUMENTS) IMPLANT
IRRIGATION SUCT STRKRFLW 2 WTP (MISCELLANEOUS) IMPLANT
KIT BASIN OR (CUSTOM PROCEDURE TRAY) ×2 IMPLANT
KIT TURNOVER KIT A (KITS) ×4 IMPLANT
RETRACTOR WND ALEXIS 18 MED (MISCELLANEOUS) IMPLANT
RETRACTOR WND ALEXIS 25 LRG (MISCELLANEOUS) IMPLANT
SCISSORS LAP 5X35 DISP (ENDOMECHANICALS) ×2 IMPLANT
SHEARS HARMONIC 36 ACE (MISCELLANEOUS) IMPLANT
SLEEVE Z-THREAD 5X100MM (TROCAR) IMPLANT
SPIKE FLUID TRANSFER (MISCELLANEOUS) ×2 IMPLANT
STRIP CLOSURE SKIN 1/2X4 (GAUZE/BANDAGES/DRESSINGS) IMPLANT
SUT MNCRL AB 4-0 PS2 18 (SUTURE) IMPLANT
SUT PDS AB 1 CT1 27 (SUTURE) IMPLANT
SUT PDS AB 1 TP1 96 (SUTURE) IMPLANT
SUT PROLENE 2 0 SH DA (SUTURE) IMPLANT
SUT SILK 2 0 SH CR/8 (SUTURE) IMPLANT
SUT SILK 2-0 18XBRD TIE 12 (SUTURE) IMPLANT
SUT SILK 3 0 SH CR/8 (SUTURE) IMPLANT
SUT SILK 3-0 18XBRD TIE 12 (SUTURE) IMPLANT
SUT VIC AB 3-0 SH 18 (SUTURE) IMPLANT
SYR BULB IRRIG 60ML STRL (SYRINGE) IMPLANT
SYSTEM LAPSCP GELPORT 120MM (MISCELLANEOUS) IMPLANT
TOWEL OR 17X26 10 PK STRL BLUE (TOWEL DISPOSABLE) ×2 IMPLANT
TRAY FOLEY MTR SLVR 16FR STAT (SET/KITS/TRAYS/PACK) ×2 IMPLANT
TRAY LAPAROSCOPIC (CUSTOM PROCEDURE TRAY) ×2 IMPLANT
TROCAR 11X100 Z THREAD (TROCAR) IMPLANT
TROCAR XCEL NON-BLD 5MMX100MML (ENDOMECHANICALS) IMPLANT
TROCAR Z THREAD OPTICAL 12X100 (TROCAR) IMPLANT
TROCAR Z-THREAD OPTICAL 5X100M (TROCAR) ×2 IMPLANT
YANKAUER SUCT BULB TIP 10FT TU (MISCELLANEOUS) IMPLANT
YANKAUER SUCT BULB TIP NO VENT (SUCTIONS) IMPLANT

## 2024-01-24 NOTE — ED Provider Notes (Signed)
 [place] EMERGENCY DEPARTMENT AT [facility] Provider Note   CSN: [id number] Arrival date & time: 01/24/24  3295     Patient presents with: Abdominal Pain   Krista [name] is a 43 y.o. female with past medical history significant for previous DVT, vertical C-section secondary to complicated pregnancy, previous gastric sleeve, recent previous bowel obstruction who presents with concern for generalized abdominal pain radiating into her lower back for 1 day.  She was admitted around 1 month ago for bowel obstruction reports pain is similar.  She has had nausea but no vomiting so far.  She reports that she had a small bowel movement prior to arrival but did not feel like she fully emptied her bowels.  She rates her pain 10/10.  She denies any fever, chills.    Abdominal Pain      Prior to Admission medications   Medication Sig Start Date End Date Taking? Authorizing Provider  buPROPion  (WELLBUTRIN  XL) 300 MG 24 hr tablet Take 300 mg by mouth daily. 08/25/22  Yes [provider]  Crisaborole  (EUCRISA ) 2 % OINT Apply 1 Application topically 2 (two) times daily as needed (mild rash). 12/22/22  Yes Trudy Fusi, DO  EPINEPHrine  (EPIPEN  2-PAK) 0.3 mg/0.3 mL IJ SOAJ injection Inject 0.3 mg into the muscle as needed for anaphylaxis.   Yes [provider]  EPINEPHrine  (EPIPEN  2-PAK) 0.3 mg/0.3 mL IJ SOAJ injection Inject 0.3 mg into the muscle as needed for anaphylaxis. 05/27/23  Yes Padgett, Rhoderick Ceo, MD  EPINEPHrine  (NEFFY ) 2 MG/0.1ML SOLN Place 1 Inhalation into the nose as needed (Anaphylaxis). 11/24/23  Yes Padgett, Rhoderick Ceo, MD  estradiol  (ESTRACE ) 1 MG tablet Take 1 mg by mouth daily.   Yes [provider]  GEMTESA 75 MG TABS Take 1 tablet by mouth daily. 12/19/23  Yes [provider]  omalizumab  (XOLAIR ) 150 MG/ML prefilled syringe Inject 300 mg into the skin every 28 (twenty-eight) days. 03/21/23  Yes Trudy Fusi, DO  omeprazole  (PRILOSEC) 40 MG capsule Take 40 mg by mouth daily. 05/24/22  Yes [provider]  phentermine 15 MG capsule Take 15 mg by mouth daily.   Yes [provider]  Phentermine HCl (LOMAIRA PO) Take 15 mg by mouth daily at 6 (six) AM. 11/15/23  Yes [provider]  topiramate (TOPAMAX) 50 MG tablet Take by mouth. 11/15/23 11/14/24 Yes [provider]  albuterol  (PROVENTIL ) (2.5 MG/3ML) 0.083% nebulizer solution Take 3 mLs (2.5 mg total) by nebulization every 4 (four) hours as needed for wheezing or shortness of breath. Patient not taking: Reported on 01/24/2024 11/13/22   Hongalgi, Anand D, MD  famotidine  (PEPCID ) 20-0.9 MG/50ML-% Inject 50 mLs (20 mg total) into the vein every 12 (twelve) hours. Patient not taking: Reported on 01/24/2024 11/13/22   Casey Clay, MD  fexofenadine  (ALLEGRA ) 180 MG tablet Take 180 mg by mouth daily as needed. Patient not taking: Reported on 01/24/2024    [provider]  LORazepam (ATIVAN) 0.5 MG tablet Take 0.5 mg by mouth as needed for anxiety. 12/30/22   [provider]  phenazopyridine (PYRIDIUM) 200 MG tablet Take 200 mg by mouth 4 (four) times daily. 11/11/23   [provider]  Vilazodone HCl (VIIBRYD) 10 MG TABS Take 10 mg by mouth daily. Patient not taking: Reported on 01/24/2024 01/20/24 04/19/24  [provider]    Allergies: Decadron [dexamethasone], Medrol  [methylprednisolone ], Prednisone, and Chlorhexidine     Review of Systems  Gastrointestinal:  Positive  for abdominal pain.  All other systems reviewed and are negative.   Updated Vital Signs BP 120/88   Pulse 84   Temp 98.5 F (36.9 C) (Oral)   Resp 18   LMP 09/06/2014   SpO2 100%   Physical Exam Vitals and nursing note reviewed.  Constitutional:      General: She is not in acute distress.    Appearance: Normal appearance.  HENT:     Head: Normocephalic and atraumatic.   Eyes:     General:        Right eye: No discharge.         Left eye: No discharge.    Cardiovascular:     Rate and Rhythm: Normal rate and regular rhythm.     Heart sounds: No murmur heard.    No friction rub. No gallop.  Pulmonary:     Effort: Pulmonary effort is normal.     Breath sounds: Normal breath sounds.  Abdominal:     General: Bowel sounds are normal.     Palpations: Abdomen is soft.     Comments: Diffusely tender throughout the abdomen, most focally in the lower quadrants, no rebound, rigidity, guarding, no overt distention.   Skin:    General: Skin is warm and dry.     Capillary Refill: Capillary refill takes less than 2 seconds.   Neurological:     Mental Status: She is alert and oriented to person, place, and time.   Psychiatric:        Mood and Affect: Mood normal.        Behavior: Behavior normal.     (all labs ordered are listed, but only abnormal results are displayed) Labs Reviewed  COMPREHENSIVE METABOLIC PANEL WITH GFR - Abnormal; Notable for the following components:      Result Value   Glucose, Bld 115 (*)    Total Bilirubin 1.7 (*)    All other components within normal limits  LIPASE, BLOOD  CBC  URINALYSIS, ROUTINE W REFLEX MICROSCOPIC    EKG: None  Radiology: CT ABDOMEN PELVIS W CONTRAST Result Date: 01/24/2024 CLINICAL DATA:  43 year old female with abdominal pain radiating to the back for 1 day, reports symptoms similar to previous bowel obstruction. EXAM: CT ABDOMEN AND PELVIS WITH CONTRAST TECHNIQUE: Multidetector CT imaging of the abdomen and pelvis was performed using the standard protocol following bolus administration of intravenous contrast. RADIATION DOSE REDUCTION: This exam was performed according to the departmental dose-optimization program which includes automated exposure control, adjustment of the mA and/or kV according to patient size and/or use of iterative reconstruction technique. CONTRAST:  100mL OMNIPAQUE IOHEXOL 300 MG/ML  SOLN COMPARISON:  None Available. FINDINGS: Lower chest:  Negative. Hepatobiliary: Cholecystectomy. Mild postoperative bile duct prominence in the liver versus mild periportal edema (such as secondary to the bowel findings below). Liver enhancement remains normal. CBD is nondilated. Pancreas: Negative. Spleen: Negative. Adrenals/Urinary Tract: Negative.  Diminutive urinary bladder. Stomach/Bowel: Decompressed large bowel throughout the abdomen and pelvis, aside from occasional mild retained stool. Cecum mostly located in the pelvis. Decompressed terminal ileum on series 2, image 60. Appendix not identified, could be diminutive or absent. Decompressed terminal ileum and other very distal small bowel loops occupying the presacral space. These decompressed loops then track to the ventral peritoneum deep to the midline lower abdominal wall (series 2, image 52). Multiple additional fully decompressed small bowel loops there. And abrupt transition point subjacent to the ventral abdominal wall there seen on both coronal image 47 and  series 2, image 47. Upstream dilated and fluid containing multiple small bowel loops measuring individually up to almost 3 cm diameter. Following retrograde, however there is a 2nd upstream small bowel transition point in the central abdomen seen on both coronal image 51 and series 2, image 38. Upstream of that proximal small bowel transition point the proximal jejunum is decompressed, but small-bowel loops become fluid-filled and mildly dilated leading up to that initial transition in the left upper quadrant. Postoperative changes along the greater curve of the stomach. Stomach and proximal duodenum with a small volume of retained fluid. No pneumoperitoneum. No free fluid identified in the abdomen. Vascular/Lymphatic: Major arterial structures in the abdomen and pelvis appear patent and normal. Portal venous system appears patent. No lymphadenopathy identified. Reproductive: Surgically absent uterus. Multiple uterine and adnexal region surgical  clips. Normal left ovary at the left pelvic inlet series 2, image 54. Right ovary diminutive or absent. Other: Small volume of free fluid in the pelvis with simple fluid density on series 2, image 60. Musculoskeletal: Some lower thoracic flowing anterior endplate osteophytes. Otherwise negative. IMPRESSION: 1. Positive for high-grade Acute Small Bowel Obstruction. But there are both proximal AND distal abrupt small bowel transition points (proximal on series 2 image 38, distal on image 47), with the maximal small bowel dilatation (nearly 3 cm) between those two transitions, and more mildly dilated proximal jejunum. This constellation strongly suggestive of CLOSED LOOP OBSTRUCTION, which might be due to adhesions rather than internal hernia given multiple prior bowel surgeries (#2). No pneumoperitoneum. Small volume of free fluid limited to the pelvis. Recommend Surgery consultation. 2. Chronic postoperative changes to the greater curve of the stomach, prior cholecystectomy, and prior hysterectomy. Questionable previous appendectomy also. Electronically Signed   By: Marlise Simpers M.D.   On: 01/24/2024 05:36     Procedures   Medications Ordered in the ED  morphine  (PF) 4 MG/ML injection 4 mg (4 mg Intravenous Given 01/24/24 0315)  ondansetron  (ZOFRAN ) injection 4 mg (4 mg Intravenous Given 01/24/24 0316)  iohexol (OMNIPAQUE) 300 MG/ML solution 100 mL (100 mLs Intravenous Contrast Given 01/24/24 0433)  morphine  (PF) 4 MG/ML injection 4 mg (4 mg Intravenous Given 01/24/24 0510)                                    Medical Decision Making Amount and/or Complexity of Data Reviewed Labs: ordered.   This patient is a 43 y.o. female  who presents to the ED for concern of abdominal pain.   Differential diagnoses prior to evaluation: The emergent differential diagnosis includes, but is not limited to,  The causes of generalized abdominal pain include but are not limited to AAA, mesenteric ischemia, appendicitis,  diverticulitis, DKA, gastritis, gastroenteritis, AMI, nephrolithiasis, pancreatitis, peritonitis, adrenal insufficiency,lead poisoning, iron toxicity, intestinal ischemia, constipation, UTI,SBO/LBO, splenic rupture, biliary disease, IBD, IBS, PUD, or hepatitis. This is not an exhaustive differential.   Past Medical History / Co-morbidities / Social History: previous DVT, vertical C-section secondary to complicated pregnancy, previous gastric sleeve, recent previous bowel obstruction  Additional history: Chart reviewed. Pertinent results include: reviewed labwork, imaging, recent admission for SBO  Physical Exam: Physical exam performed. The pertinent findings include: Diffusely tender throughout the abdomen, most focally in the lower quadrants, no rebound, rigidity, guarding, no overt distention.   Lab Tests/Imaging studies: I personally interpreted labs/imaging and the pertinent results include:  cbc unremarkable, CMP with mildly elevated bilirubin, 1.7,  otherwise unremarkable.  Lipase normal.  CT abdomen pelvis with dilated loops of bowel consistent with small bowel obstruction, closed-loop obstruction noted.  I agree with the radiologist interpretation.  Medications: I ordered medication including morphine  for pain, Zofran  for nausea.  I have reviewed the patients home medicines and have made adjustments as needed.  Consults: Spoke with Dr. Melton Squires who agrees to surgery admission, consult to hospitalist Dr Amy Kansky who agrees to admission for small bowel obstruction.   Disposition: After consideration of the diagnostic results and the patients response to treatment, I feel that patient would benefit from admission for small bowel obstruction..    Final diagnoses:  Small bowel obstruction [facility])    ED Discharge Orders     None          Nelly Banco, PA-C 01/24/24 0617    Albertus Hughs, DO 01/24/24 0630

## 2024-01-24 NOTE — Anesthesia Procedure Notes (Addendum)
 Procedure Name: Intubation Date/Time: 01/24/2024 12:46 PM  Performed by: Virgil Griffiths, CRNAPre-anesthesia Checklist: Patient identified, Emergency Drugs available, Suction available and Patient being monitored Patient Re-evaluated:Patient Re-evaluated prior to induction Oxygen Delivery Method: Circle System Utilized Preoxygenation: Pre-oxygenation with 100% oxygen Induction Type: IV induction, Rapid sequence and Cricoid Pressure applied Laryngoscope Size: Mac and 3 Grade View: Grade I Tube type: Oral Tube size: 7.0 mm Number of attempts: 1 Airway Equipment and Method: Stylet and Oral airway Placement Confirmation: ETT inserted through vocal cords under direct vision, positive ETCO2 and breath sounds checked- equal and bilateral Secured at: 21 cm Tube secured with: Tape Dental Injury: Teeth and Oropharynx as per pre-operative assessment

## 2024-01-24 NOTE — H&P (Signed)
 History and Physical    Patient: Krista [name] LKG:[id number] DOB: 20-Oct-1980 DOA: 01/24/2024 DOS: the patient was seen and examined on 01/24/2024 PCP: Melva Stabile, MD  Patient coming from: Home  Chief Complaint:  Chief Complaint  Patient presents with   Abdominal Pain   HPI: Krista [name] is a 43 y.o. female with medical history significant of asthma, anaphylaxis due to to corticosteroids, DVT, obesity, postoperative nausea and vomiting, urticaria who presented to the emergency department complaints of abdominal pain radiated to her lower back for 1 day associated with nausea, but no emesis. No diarrhea or constipation. No melena or hematochezia. She experienced similar symptoms during an episode of bowel obstruction. She denied fever, chills, rhinorrhea, sore throat, wheezing or hemoptysis. No chest pain, palpitations, diaphoresis, PND, orthopnea or pitting edema of the lower extremities. No flank pain, dysuria, frequency or hematuria. No polyuria, polydipsia, polyphagia or blurred vision.   Lab work: CBC was normal.  Unremarkable lipase.  CMP showed a glucose of 115 and total bilirubin 1.7 mg/dL, the rest of the CMP measurements were normal.  Imaging: CT abdomen was positive for high-grade acute small bowel obstruction with both proximal and distal abrupt small bowel transition point suggested a closed-loop obstruction.  Chronic postoperative changes to the greater curve of the stomach, questionable appendectomy, prior cholecystectomy and prior hysterectomy.   ED course: Initial vital signs were temperature 98.5 F, pulse 88, respiration 18, BP 136/104 mmHg O2 sat 99% on room air.  Patient received morphine  4 mg IVP x 1.  Review of Systems: As mentioned in the history of present illness. All other systems reviewed and are negative.  Past Medical History:  Diagnosis Date   Asthma    exercise induced   Complication of anesthesia    DVT (deep venous thrombosis) (HCC)    Family  history of adverse reaction to anesthesia    PONV   Obesity    PONV (postoperative nausea and vomiting)    Urticaria    Past Surgical History:  Procedure Laterality Date   ABDOMINAL HYSTERECTOMY     BREAST REDUCTION SURGERY Bilateral 11/02/2022   Procedure: MAMMARY REDUCTION  (BREAST);  Surgeon: Alger Infield, MD;  Location: [facility];  Service: Plastics;  Laterality: Bilateral;   CESAREAN SECTION     CESAREAN SECTION N/A 05/18/2015   Procedure: CESAREAN SECTION;  Surgeon: Thora Flint, MD;  Location: [facility];  Service: Obstetrics;  Laterality: N/A;  Repeat edc 06/13/15 allergies to codeine, prednisone, DHA supplements   CHOLECYSTECTOMY     GASTRIC BYPASS  06/15/2021   sleeve   KNEE SURGERY     Social History:  reports that she has never smoked. She has never been exposed to tobacco smoke. She has never used smokeless tobacco. She reports that she does not drink alcohol and does not use drugs.  Allergies  Allergen Reactions   Decadron [Dexamethasone] Anaphylaxis   Medrol  [Methylprednisolone ] Anaphylaxis   Prednisone Hives, Itching, Swelling and Other (See Comments)    Lips and tongue swell     Chlorhexidine      Family History  Problem Relation Age of Onset   Hypertension Mother    Diabetes Mother    Hypertension Father    Chromosomal disorder Son        duplication chromosome 5    Prior to Admission medications   Medication Sig Start Date End Date Taking? Authorizing Provider  buPROPion  (WELLBUTRIN  XL) 300 MG 24 hr tablet Take 300 mg by mouth daily.  08/25/22  Yes [provider]  Crisaborole  (EUCRISA ) 2 % OINT Apply 1 Application topically 2 (two) times daily as needed (mild rash). 12/22/22  Yes Trudy Fusi, DO  EPINEPHrine  (EPIPEN  2-PAK) 0.3 mg/0.3 mL IJ SOAJ injection Inject 0.3 mg into the muscle as needed for anaphylaxis.   Yes [provider]  EPINEPHrine  (EPIPEN  2-PAK) 0.3 mg/0.3 mL IJ SOAJ injection Inject 0.3 mg into the  muscle as needed for anaphylaxis. 05/27/23  Yes Padgett, Rhoderick Ceo, MD  EPINEPHrine  (NEFFY ) 2 MG/0.1ML SOLN Place 1 Inhalation into the nose as needed (Anaphylaxis). 11/24/23  Yes Padgett, Rhoderick Ceo, MD  estradiol  (ESTRACE ) 1 MG tablet Take 1 mg by mouth daily.   Yes [provider]  GEMTESA 75 MG TABS Take 1 tablet by mouth daily. 12/19/23  Yes [provider]  omalizumab  (XOLAIR ) 150 MG/ML prefilled syringe Inject 300 mg into the skin every 28 (twenty-eight) days. 03/21/23  Yes Trudy Fusi, DO  omeprazole (PRILOSEC) 40 MG capsule Take 40 mg by mouth daily. 05/24/22  Yes [provider]  phentermine 15 MG capsule Take 15 mg by mouth daily.   Yes [provider]  Phentermine HCl (LOMAIRA PO) Take 15 mg by mouth daily at 6 (six) AM. 11/15/23  Yes [provider]  topiramate (TOPAMAX) 50 MG tablet Take by mouth. 11/15/23 11/14/24 Yes [provider]  albuterol  (PROVENTIL ) (2.5 MG/3ML) 0.083% nebulizer solution Take 3 mLs (2.5 mg total) by nebulization every 4 (four) hours as needed for wheezing or shortness of breath. Patient not taking: Reported on 01/24/2024 11/13/22   Hongalgi, Anand D, MD  famotidine  (PEPCID ) 20-0.9 MG/50ML-% Inject 50 mLs (20 mg total) into the vein every 12 (twelve) hours. Patient not taking: Reported on 01/24/2024 11/13/22   Hongalgi, Anand D, MD  fexofenadine  (ALLEGRA ) 180 MG tablet Take 180 mg by mouth daily as needed. Patient not taking: Reported on 01/24/2024    [provider]  LORazepam (ATIVAN) 0.5 MG tablet Take 0.5 mg by mouth as needed for anxiety. 12/30/22   [provider]  phenazopyridine (PYRIDIUM) 200 MG tablet Take 200 mg by mouth 4 (four) times daily. 11/11/23   [provider]  Vilazodone HCl (VIIBRYD) 10 MG TABS Take 10 mg by mouth daily. Patient not taking: Reported on 01/24/2024 01/20/24 04/19/24  [provider]    Physical Exam: Vitals:   01/24/24 0600 01/24/24 0615  01/24/24 0630 01/24/24 0738  BP: 122/69 117/82 (!) 114/91 127/82  Pulse: 83 79 92 82  Resp:    18  Temp:    98.3 F (36.8 C)  TempSrc:    Oral  SpO2: 100% 100% 99% 98%   Physical Exam Vitals and nursing note reviewed.  Constitutional:      General: She is awake. She is not in acute distress.    Appearance: She is ill-appearing.  HENT:     Head: Normocephalic.     Nose: No rhinorrhea.     Mouth/Throat:     Mouth: Mucous membranes are dry.   Eyes:     General: No scleral icterus.    Pupils: Pupils are equal, round, and reactive to light.   Neck:     Vascular: No JVD.   Cardiovascular:     Rate and Rhythm: Normal rate.     Heart sounds: S1 normal and S2 normal.  Pulmonary:     Breath sounds: No wheezing, rhonchi or rales.  Abdominal:     Palpations: Abdomen is soft.  Tenderness: There is abdominal tenderness.   Musculoskeletal:     Cervical back: Neck supple.     Right lower leg: No edema.     Left lower leg: No edema.   Skin:    General: Skin is warm and dry.   Neurological:     General: No focal deficit present.     Mental Status: She is alert and oriented to person, place, and time.   Psychiatric:        Mood and Affect: Mood normal.        Behavior: Behavior normal. Behavior is cooperative.     Data Reviewed:  Results are pending, will review when available.  Assessment and Plan: Principal Problem:   SBO (small bowel obstruction) (HCC) Inpatient/MedSurg. Keep NPO. Unable to tolerate NGT. Continue IV fluids. Analgesics as needed. Antiemetics as needed. Pantoprazole  40 mg IVP every 24 hours. Keep electrolytes optimized. Follow-up CBC and CMP in AM. Follow-up imaging in the morning. General surgery input appreciated. - Patient had to undergo surgery for decompression.    Advance Care Planning:   Code Status: Full Code   Consults: [facility] surgery.  Family Communication: Her husband was at bedside.  Severity of Illness: The  appropriate patient status for this patient is INPATIENT. Inpatient status is judged to be reasonable and necessary in order to provide the required intensity of service to ensure the patient's safety. The patient's presenting symptoms, physical exam findings, and initial radiographic and laboratory data in the context of their chronic comorbidities is felt to place them at high risk for further clinical deterioration. Furthermore, it is not anticipated that the patient will be medically stable for discharge from the hospital within 2 midnights of admission.   * I certify that at the point of admission it is my clinical judgment that the patient will require inpatient hospital care spanning beyond 2 midnights from the point of admission due to high intensity of service, high risk for further deterioration and high frequency of surveillance required.*  Author: Danice Dural, MD 01/24/2024 8:03 AM  For on call review [email or web address].   This document was prepared using Dragon voice recognition software and may contain some unintended transcription errors.

## 2024-01-24 NOTE — ED Triage Notes (Signed)
 Pt states that she has generalized abdominal pain that radiates into lower back x 1 day. Pt recently seen at [facility] for bowel obstruction and pain is similar. Pt reports nausea, as well. Pt reports she had small BM prior to arrival.

## 2024-01-24 NOTE — ED Notes (Signed)
 RN gave report to short stay patient is scheduled for 1300 procedure if patient is upstairs before 1300 have nurses wipe down in CHG wipes and brush her teeth.

## 2024-01-24 NOTE — Anesthesia Preprocedure Evaluation (Addendum)
 Anesthesia Evaluation  Patient identified by MRN, date of birth, ID band Patient awake    Reviewed: Allergy & Precautions, NPO status , Patient's Chart, lab work & pertinent test results  History of Anesthesia Complications (+) PONV and history of anesthetic complications  Airway Mallampati: II  TM Distance: >3 FB Neck ROM: Full    Dental no notable dental hx.    Pulmonary asthma    Pulmonary exam normal        Cardiovascular + DVT  Normal cardiovascular exam     Neuro/Psych   Anxiety     negative neurological ROS     GI/Hepatic Neg liver ROS,,,GASTRIC BYPASS   Endo/Other  negative endocrine ROS    Renal/GU negative Renal ROS     Musculoskeletal negative musculoskeletal ROS (+)    Abdominal   Peds  Hematology negative hematology ROS (+)   Anesthesia Other Findings SBO  Reproductive/Obstetrics                             Anesthesia Physical Anesthesia Plan  ASA: 2  Anesthesia Plan: General   Post-op Pain Management:    Induction: Intravenous and Rapid sequence  PONV Risk Score and Plan: 4 or greater and Ondansetron , Propofol  infusion, Midazolam , Scopolamine  patch - Pre-op and Treatment may vary due to age or medical condition  Airway Management Planned: Oral ETT  Additional Equipment:   Intra-op Plan:   Post-operative Plan: Extubation in OR  Informed Consent: I have reviewed the patients History and Physical, chart, labs and discussed the procedure including the risks, benefits and alternatives for the proposed anesthesia with the patient or authorized representative who has indicated his/her understanding and acceptance.     Dental advisory given  Plan Discussed with: CRNA  Anesthesia Plan Comments:        Anesthesia Quick Evaluation

## 2024-01-24 NOTE — ED Notes (Signed)
Patient aware that urine sample is needed.

## 2024-01-24 NOTE — Consult Note (Signed)
 Consult Note  Krista [name] 05-04-1981  [id number].    Requesting MD: Arnita Bible, PA-C Chief Complaint/Reason for Consult:  SBO  HPI:  Patient is a 43 year old female who presented to the ED with lower abdominal pain since yesterday. She was recently admitted for SBO at [facility] 01/02/24-01/04/24 and resolved without surgical intervention. She reports since being home she was having bowel function and in her usual state of health but noted Thursday or Friday that she was starting to feel constipated. She took some miralax and some dulcolax and had some loose stools but yesterday noted severe abdominal pain. Pain was sharp and stabbing and increased in severity from prior SBO. She is nauseated but denies vomiting. She is passing flatus but still reporting severe pain. Prior abdominal surgery includes gastric sleeve in 2022, laparoscopic cholecystectomy, cesarean x4 with last being complicated by need for emergent hysterectomy. PMH otherwise significant for hx of DVT, asthma, and PONV. She has an allergy to steroids with reaction of anaphylaxis and chlorhexidine . She is not currently on any blood thinners.   ROS: Negative other than HPI  Family History  Problem Relation Age of Onset   Hypertension Mother    Diabetes Mother    Hypertension Father    Chromosomal disorder Son        duplication chromosome 5    Past Medical History:  Diagnosis Date   Asthma    exercise induced   Complication of anesthesia    DVT (deep venous thrombosis) (HCC)    Family history of adverse reaction to anesthesia    PONV   Obesity    PONV (postoperative nausea and vomiting)    Urticaria     Past Surgical History:  Procedure Laterality Date   ABDOMINAL HYSTERECTOMY     BREAST REDUCTION SURGERY Bilateral 11/02/2022   Procedure: MAMMARY REDUCTION  (BREAST);  Surgeon: Alger Infield, MD;  Location: [facility];  Service: Plastics;  Laterality: Bilateral;   CESAREAN SECTION      CESAREAN SECTION N/A 05/18/2015   Procedure: CESAREAN SECTION;  Surgeon: Thora Flint, MD;  Location: [facility];  Service: Obstetrics;  Laterality: N/A;  Repeat edc 06/13/15 allergies to codeine, prednisone, DHA supplements   CHOLECYSTECTOMY     GASTRIC BYPASS  06/15/2021   sleeve   KNEE SURGERY      Social History:  reports that she has never smoked. She has never been exposed to tobacco smoke. She has never used smokeless tobacco. She reports that she does not drink alcohol and does not use drugs.  Allergies:  Allergies  Allergen Reactions   Decadron [Dexamethasone] Anaphylaxis   Medrol  [Methylprednisolone ] Anaphylaxis   Prednisone Hives, Itching, Swelling and Other (See Comments)    Lips and tongue swell     Chlorhexidine      (Not in a hospital admission)   Blood pressure 127/82, pulse 82, temperature 98.3 F (36.8 C), temperature source Oral, resp. rate 18, last menstrual period 09/06/2014, SpO2 98%, unknown if currently breastfeeding. Physical Exam:  General: pleasant, WD, WN female who is laying in bed in NAD HEENT: head is normocephalic, atraumatic.  Sclera are noninjected.  Pupils equal and round.  Ears and nose without any masses or lesions.  Mouth is pink and dry Heart: regular, rate, and rhythm.  Palpable radial and pedal pulses bilaterally Lungs: No wheezes, rhonchi, or rales noted.  Respiratory effort nonlabored Abd: soft, TTP in suprapubic and periumbilical abdomen, ND, multiple prior surgical scars well healed  MS: all 4 extremities are symmetrical with no cyanosis, clubbing, or edema. Skin: warm and dry with no masses, lesions, or rashes Neuro: Cranial nerves 2-12 grossly intact, sensation is normal throughout Psych: A&Ox3 with an appropriate affect.   Results for orders placed or performed during the hospital encounter of 01/24/24 (from the past 48 hours)  Lipase, blood     Status: None   Collection Time: 01/24/24  2:40 AM  Result Value Ref Range    Lipase 37 11 - 51 U/L    Comment: Performed at [facility], [address], [place], [place] [address]  Comprehensive metabolic panel     Status: Abnormal   Collection Time: 01/24/24  2:40 AM  Result Value Ref Range   Sodium 139 135 - 145 mmol/L   Potassium 4.3 3.5 - 5.1 mmol/L   Chloride 105 98 - 111 mmol/L   CO2 23 22 - 32 mmol/L   Glucose, Bld 115 (H) 70 - 99 mg/dL    Comment: Glucose reference range applies only to samples taken after fasting for at least 8 hours.   BUN 14 6 - 20 mg/dL   Creatinine, Ser 9.14 0.44 - 1.00 mg/dL   Calcium 9.3 8.9 - 78.2 mg/dL   Total Protein 7.3 6.5 - 8.1 g/dL   Albumin 4.0 3.5 - 5.0 g/dL   AST 21 15 - 41 U/L   ALT 13 0 - 44 U/L   Alkaline Phosphatase 46 38 - 126 U/L   Total Bilirubin 1.7 (H) 0.0 - 1.2 mg/dL   GFR, Estimated >95 >62 mL/min    Comment: (NOTE) Calculated using the CKD-EPI Creatinine Equation (2021)    Anion gap 11 5 - 15    Comment: Performed at [facility], [address], [place], [place] [address]  CBC     Status: None   Collection Time: 01/24/24  2:40 AM  Result Value Ref Range   WBC 8.5 4.0 - 10.5 K/uL   RBC 4.55 3.87 - 5.11 MIL/uL   Hemoglobin 14.1 12.0 - 15.0 g/dL   HCT 57.8 46.9 - 62.9 %   MCV 96.3 80.0 - 100.0 fL   MCH 31.0 26.0 - 34.0 pg   MCHC 32.2 30.0 - 36.0 g/dL   RDW 52.8 41.3 - 24.4 %   Platelets 297 150 - 400 K/uL   nRBC 0.0 0.0 - 0.2 %    Comment: Performed at [facility], [address], [place], [place] [address]   CT ABDOMEN PELVIS W CONTRAST Result Date: 01/24/2024 CLINICAL DATA:  43 year old female with abdominal pain radiating to the back for 1 day, reports symptoms similar to previous bowel obstruction. EXAM: CT ABDOMEN AND PELVIS WITH CONTRAST TECHNIQUE: Multidetector CT imaging of the abdomen and pelvis was performed using the standard protocol following bolus administration of intravenous contrast. RADIATION DOSE REDUCTION: This exam was  performed according to the departmental dose-optimization program which includes automated exposure control, adjustment of the mA and/or kV according to patient size and/or use of iterative reconstruction technique. CONTRAST:  100mL OMNIPAQUE IOHEXOL 300 MG/ML  SOLN COMPARISON:  None Available. FINDINGS: Lower chest: Negative. Hepatobiliary: Cholecystectomy. Mild postoperative bile duct prominence in the liver versus mild periportal edema (such as secondary to the bowel findings below). Liver enhancement remains normal. CBD is nondilated. Pancreas: Negative. Spleen: Negative. Adrenals/Urinary Tract: Negative.  Diminutive urinary bladder. Stomach/Bowel: Decompressed large bowel throughout the abdomen and pelvis, aside from occasional mild retained stool. Cecum mostly located in  the pelvis. Decompressed terminal ileum on series 2, image 60. Appendix not identified, could be diminutive or absent. Decompressed terminal ileum and other very distal small bowel loops occupying the presacral space. These decompressed loops then track to the ventral peritoneum deep to the midline lower abdominal wall (series 2, image 52). Multiple additional fully decompressed small bowel loops there. And abrupt transition point subjacent to the ventral abdominal wall there seen on both coronal image 47 and series 2, image 47. Upstream dilated and fluid containing multiple small bowel loops measuring individually up to almost 3 cm diameter. Following retrograde, however there is a 2nd upstream small bowel transition point in the central abdomen seen on both coronal image 51 and series 2, image 38. Upstream of that proximal small bowel transition point the proximal jejunum is decompressed, but small-bowel loops become fluid-filled and mildly dilated leading up to that initial transition in the left upper quadrant. Postoperative changes along the greater curve of the stomach. Stomach and proximal duodenum with a small volume of retained fluid.  No pneumoperitoneum. No free fluid identified in the abdomen. Vascular/Lymphatic: Major arterial structures in the abdomen and pelvis appear patent and normal. Portal venous system appears patent. No lymphadenopathy identified. Reproductive: Surgically absent uterus. Multiple uterine and adnexal region surgical clips. Normal left ovary at the left pelvic inlet series 2, image 54. Right ovary diminutive or absent. Other: Small volume of free fluid in the pelvis with simple fluid density on series 2, image 60. Musculoskeletal: Some lower thoracic flowing anterior endplate osteophytes. Otherwise negative. IMPRESSION: 1. Positive for high-grade Acute Small Bowel Obstruction. But there are both proximal AND distal abrupt small bowel transition points (proximal on series 2 image 38, distal on image 47), with the maximal small bowel dilatation (nearly 3 cm) between those two transitions, and more mildly dilated proximal jejunum. This constellation strongly suggestive of CLOSED LOOP OBSTRUCTION, which might be due to adhesions rather than internal hernia given multiple prior bowel surgeries (#2). No pneumoperitoneum. Small volume of free fluid limited to the pelvis. Recommend Surgery consultation. 2. Chronic postoperative changes to the greater curve of the stomach, prior cholecystectomy, and prior hysterectomy. Questionable previous appendectomy also. Electronically Signed   By: Marlise Simpers M.D.   On: 01/24/2024 05:36      Assessment/Plan Recurrent SBO - pt just hospitalized 3 weeks ago at [facility] for similar, resolved without surgery but now recurrent symptoms - multiple prior abd surgeries including gastric sleeve, cholecystectomy, cesarean x4 with last being complicated by accreta with need for emergent hysterectomy  - CT this AM with concern for high grade SBO with 2 transition points suggestive of possible closed loop obstruction  - HD stable - not actively vomiting, can place NGT in OR instead - keep NPO -  given quick recurrence in symptoms warrants OR for dx laparoscopic, possible laparotomy, possible bowel resection   FEN: NPO, IVF per [facility] VTE: ok to have [facility] or LMWH from surgery standpoint ID: [email or web address]  on call to OR  - per [facility] -  Hx of DVT after knee surgery  Asthma   I reviewed ED provider notes, last 24 h vitals and pain scores, last 48 h intake and output, last 24 h labs and trends, and last 24 h imaging results.  This care required high  level of medical decision making.   Annetta Killian, [facility] Surgery 01/24/2024, 8:10 AM Please see Amion for pager number during day hours 7:00am-4:30pm

## 2024-01-24 NOTE — ED Notes (Signed)
 RN called [facility] letting them know patient is about to be transported

## 2024-01-24 NOTE — Transfer of Care (Signed)
 Immediate Anesthesia Transfer of Care Note  Patient: Krista [name]  Procedure(s) Performed: LAPAROSCOPY, DIAGNOSTIC  Patient Location: PACU  Anesthesia Type:General  Level of Consciousness: awake, alert , and oriented  Airway & Oxygen Therapy: Patient Spontanous Breathing  Post-op Assessment: Report given to RN and Post -op Vital signs reviewed and stable  Post vital signs: Reviewed and stable  Last Vitals:  Vitals Value Taken Time  BP 119/77 01/24/24 14:55  Temp    Pulse 85 01/24/24 14:57  Resp 7 01/24/24 14:57  SpO2 99 % 01/24/24 14:57  Vitals shown include unfiled device data.  Last Pain:  Vitals:   01/24/24 1150  TempSrc:   PainSc: 6       Patients Stated Pain Goal: 0 (01/24/24 0934)  Complications: No notable events documented.

## 2024-01-24 NOTE — ED Notes (Signed)
 This RN has unsuccessful attempts, meeting resistance, placing NG tube in bilateral nares. Garret Kales, DO and Georgianna Kirschner, PA notified. RN informed to wait for surgery team to evaluate patient.

## 2024-01-24 NOTE — ED Notes (Addendum)
 ED TO INPATIENT HANDOFF REPORT  ED Nurse Name and Phone #: Loney Rivet RN [phone]  S Name/Age/Gender Krista [name] 43 y.o. female Room/Bed: WA14/WA14  Code Status   Code Status: Full Code  Home/SNF/Other Home Patient oriented to: self, place, time, and situation Is this baseline? Yes   Triage Complete: Triage complete  Chief Complaint SBO (small bowel obstruction) (HCC) [K56.609]  Triage Note Pt states that she has generalized abdominal pain that radiates into lower back x 1 day. Pt recently seen at [facility] for bowel obstruction and pain is similar. Pt reports nausea, as well. Pt reports she had small BM prior to arrival.    Allergies Allergies  Allergen Reactions   Decadron [Dexamethasone] Anaphylaxis   Medrol  [Methylprednisolone ] Anaphylaxis   Prednisone Hives, Itching, Swelling and Other (See Comments)    Lips and tongue swell     Chlorhexidine      Level of Care/Admitting Diagnosis ED Disposition     ED Disposition  Admit   Condition  --   Comment  Hospital Area: [facility] [[id number]]  Level of Care: Med-Surg [16]  May admit patient to [facility] or [facility] Smalling if equivalent level of care is available:: No  Covid Evaluation: Asymptomatic - no recent exposure (last 10 days) testing not required  Diagnosis: SBO (small bowel obstruction) [facility]) [[id number]]  Admitting Physician: Danice Dural [[id number]]  Attending Physician: Danice Dural [[id number]]  Certification:: I certify this patient will need inpatient services for at least 2 midnights  Expected Medical Readiness: 01/26/2024          B Medical/Surgery History Past Medical History:  Diagnosis Date   Asthma    exercise induced   Complication of anesthesia    DVT (deep venous thrombosis) (HCC)    Family history of adverse reaction to anesthesia    PONV   Obesity    PONV (postoperative nausea and vomiting)    Urticaria    Past Surgical History:  Procedure Laterality Date    ABDOMINAL HYSTERECTOMY     BREAST REDUCTION SURGERY Bilateral 11/02/2022   Procedure: MAMMARY REDUCTION  (BREAST);  Surgeon: Alger Infield, MD;  Location: [facility];  Service: Plastics;  Laterality: Bilateral;   CESAREAN SECTION     CESAREAN SECTION N/A 05/18/2015   Procedure: CESAREAN SECTION;  Surgeon: Thora Flint, MD;  Location: [facility];  Service: Obstetrics;  Laterality: N/A;  Repeat edc 06/13/15 allergies to codeine, prednisone, DHA supplements   CHOLECYSTECTOMY     GASTRIC BYPASS  06/15/2021   sleeve   KNEE SURGERY       A IV Location/Drains/Wounds Patient Lines/Drains/Airways Status     Active Line/Drains/Airways     Name Placement date Placement time Site Days   Peripheral IV 01/24/24 20 G Anterior;Distal;Right;Upper Arm 01/24/24  0315  Arm  less than 1            Intake/Output Last 24 hours No intake or output data in the 24 hours ending 01/24/24 0901  Labs/Imaging Results for orders placed or performed during the hospital encounter of 01/24/24 (from the past 48 hours)  Lipase, blood     Status: None   Collection Time: 01/24/24  2:40 AM  Result Value Ref Range   Lipase 37 11 - 51 U/L    Comment: Performed at [facility] [address], [place], [place] [address]  Comprehensive metabolic panel     Status: Abnormal   Collection Time: 01/24/24  2:40 AM  Result  Value Ref Range   Sodium 139 135 - 145 mmol/L   Potassium 4.3 3.5 - 5.1 mmol/L   Chloride 105 98 - 111 mmol/L   CO2 23 22 - 32 mmol/L   Glucose, Bld 115 (H) 70 - 99 mg/dL    Comment: Glucose reference range applies only to samples taken after fasting for at least 8 hours.   BUN 14 6 - 20 mg/dL   Creatinine, Ser 1.61 0.44 - 1.00 mg/dL   Calcium 9.3 8.9 - 09.6 mg/dL   Total Protein 7.3 6.5 - 8.1 g/dL   Albumin 4.0 3.5 - 5.0 g/dL   AST 21 15 - 41 U/L   ALT 13 0 - 44 U/L   Alkaline Phosphatase 46 38 - 126 U/L   Total Bilirubin 1.7 (H) 0.0 - 1.2 mg/dL   GFR,  Estimated >04 >54 mL/min    Comment: (NOTE) Calculated using the CKD-EPI Creatinine Equation (2021)    Anion gap 11 5 - 15    Comment: Performed at [facility], [address], [place], [place] [address]  CBC     Status: None   Collection Time: 01/24/24  2:40 AM  Result Value Ref Range   WBC 8.5 4.0 - 10.5 K/uL   RBC 4.55 3.87 - 5.11 MIL/uL   Hemoglobin 14.1 12.0 - 15.0 g/dL   HCT 91.4 78.2 - 95.6 %   MCV 96.3 80.0 - 100.0 fL   MCH 31.0 26.0 - 34.0 pg   MCHC 32.2 30.0 - 36.0 g/dL   RDW 21.3 08.6 - 57.8 %   Platelets 297 150 - 400 K/uL   nRBC 0.0 0.0 - 0.2 %    Comment: Performed at [facility], [address] [place], [place] [address]  Type and screen [facility]     Status: None (Preliminary result)   Collection Time: 01/24/24  8:37 AM  Result Value Ref Range   ABO/RH(D) PENDING    Antibody Screen PENDING    Sample Expiration      01/27/2024,2359 Performed at [facility], [address] [place], [place] [address]    CT ABDOMEN PELVIS W CONTRAST Result Date: 01/24/2024 CLINICAL DATA:  43 year old female with abdominal pain radiating to the back for 1 day, reports symptoms similar to previous bowel obstruction. EXAM: CT ABDOMEN AND PELVIS WITH CONTRAST TECHNIQUE: Multidetector CT imaging of the abdomen and pelvis was performed using the standard protocol following bolus administration of intravenous contrast. RADIATION DOSE REDUCTION: This exam was performed according to the departmental dose-optimization program which includes automated exposure control, adjustment of the mA and/or kV according to patient size and/or use of iterative reconstruction technique. CONTRAST:  100mL OMNIPAQUE IOHEXOL 300 MG/ML  SOLN COMPARISON:  None Available. FINDINGS: Lower chest: Negative. Hepatobiliary: Cholecystectomy. Mild postoperative bile duct prominence in the liver versus mild periportal edema (such as secondary to  the bowel findings below). Liver enhancement remains normal. CBD is nondilated. Pancreas: Negative. Spleen: Negative. Adrenals/Urinary Tract: Negative.  Diminutive urinary bladder. Stomach/Bowel: Decompressed large bowel throughout the abdomen and pelvis, aside from occasional mild retained stool. Cecum mostly located in the pelvis. Decompressed terminal ileum on series 2, image 60. Appendix not identified, could be diminutive or absent. Decompressed terminal ileum and other very distal small bowel loops occupying the presacral space. These decompressed loops then track to the ventral peritoneum deep to the midline lower abdominal wall (series 2, image 52). Multiple additional fully decompressed small bowel loops there.  And abrupt transition point subjacent to the ventral abdominal wall there seen on both coronal image 47 and series 2, image 47. Upstream dilated and fluid containing multiple small bowel loops measuring individually up to almost 3 cm diameter. Following retrograde, however there is a 2nd upstream small bowel transition point in the central abdomen seen on both coronal image 51 and series 2, image 38. Upstream of that proximal small bowel transition point the proximal jejunum is decompressed, but small-bowel loops become fluid-filled and mildly dilated leading up to that initial transition in the left upper quadrant. Postoperative changes along the greater curve of the stomach. Stomach and proximal duodenum with a small volume of retained fluid. No pneumoperitoneum. No free fluid identified in the abdomen. Vascular/Lymphatic: Major arterial structures in the abdomen and pelvis appear patent and normal. Portal venous system appears patent. No lymphadenopathy identified. Reproductive: Surgically absent uterus. Multiple uterine and adnexal region surgical clips. Normal left ovary at the left pelvic inlet series 2, image 54. Right ovary diminutive or absent. Other: Small volume of free fluid in the pelvis  with simple fluid density on series 2, image 60. Musculoskeletal: Some lower thoracic flowing anterior endplate osteophytes. Otherwise negative. IMPRESSION: 1. Positive for high-grade Acute Small Bowel Obstruction. But there are both proximal AND distal abrupt small bowel transition points (proximal on series 2 image 38, distal on image 47), with the maximal small bowel dilatation (nearly 3 cm) between those two transitions, and more mildly dilated proximal jejunum. This constellation strongly suggestive of CLOSED LOOP OBSTRUCTION, which might be due to adhesions rather than internal hernia given multiple prior bowel surgeries (#2). No pneumoperitoneum. Small volume of free fluid limited to the pelvis. Recommend Surgery consultation. 2. Chronic postoperative changes to the greater curve of the stomach, prior cholecystectomy, and prior hysterectomy. Questionable previous appendectomy also. Electronically Signed   By: Marlise Simpers M.D.   On: 01/24/2024 05:36    Pending Labs Unresulted Labs (From admission, onward)     Start     Ordered   01/25/24 0500  HIV Antibody (routine testing w rflx)  (HIV Antibody (Routine testing w reflex) panel)  Tomorrow morning,   R        01/24/24 0858   01/25/24 0500  CBC  Tomorrow morning,   R        01/24/24 0858   01/25/24 0500  Comprehensive metabolic panel  Tomorrow morning,   R        01/24/24 0858   01/24/24 0226  Urinalysis, Routine w reflex microscopic -Urine, Clean Catch  Once,   URGENT       Question:  Specimen Source  Answer:  Urine, Clean Catch   01/24/24 0225            Vitals/Pain Today's Vitals   01/24/24 0719 01/24/24 0738 01/24/24 0747 01/24/24 0815  BP:  127/82    Pulse:  82    Resp:  18    Temp:  98.3 F (36.8 C)    TempSrc:  Oral    SpO2:  98%    PainSc: 10-Worst pain ever  10-Worst pain ever 8     Isolation Precautions No active isolations  Medications Medications  ceFAZolin  (ANCEF ) IVPB 2g/100 mL premix (has no administration in  time range)  ondansetron  (ZOFRAN ) injection 4 mg (has no administration in time range)  morphine  (PF) 2 MG/ML injection 2-4 mg (has no administration in time range)  enoxaparin  (LOVENOX ) injection 40 mg (has no administration in time range)  acetaminophen  (TYLENOL ) tablet 650 mg (has no administration in time range)    Or  acetaminophen  (TYLENOL ) suppository 650 mg (has no administration in time range)  pantoprazole  (PROTONIX ) injection 40 mg (has no administration in time range)  morphine  (PF) 4 MG/ML injection 4 mg (4 mg Intravenous Given 01/24/24 0315)  ondansetron  (ZOFRAN ) injection 4 mg (4 mg Intravenous Given 01/24/24 0316)  iohexol (OMNIPAQUE) 300 MG/ML solution 100 mL (100 mLs Intravenous Contrast Given 01/24/24 0433)  morphine  (PF) 4 MG/ML injection 4 mg (4 mg Intravenous Given 01/24/24 0510)  morphine  (PF) 4 MG/ML injection 4 mg (4 mg Intravenous Given 01/24/24 0747)    Mobility walks     Focused Assessments Neuro Assessment Handoff:  Swallow screen pass? N/A         Neuro Assessment:   Neuro Checks:      Has TPA been given? No If patient is a Neuro Trauma and patient is going to OR before floor call report to 4N Charge nurse: [phone] or [phone]   R Recommendations: See Admitting Provider Note  Report given to:   Additional Notes:   Patient procedure is at 1300 pleas have her wipe off with CHG wipes and brush her teeth

## 2024-01-24 NOTE — Op Note (Signed)
 Operative Note  Krista [name]  [id number]  [id number]  01/24/2024   Surgeon: Aldon Hung MD FACS   Assistant: Berkeley Breath PA-C   Procedure performed: Diagnostic laparoscopy, laparoscopic lysis of adhesions x 15 minutes; exploratory laparotomy with lysis of adhesions x 15 minutes, repair of expected serosal tears   Preop diagnosis: Small bowel obstruction Post-op diagnosis/intraop findings: Same, primarily due to a thick adhesive band extending from proximal small bowel to the right in the pelvis but additional multifocal partial points of adhesive obstruction present.  No closed-loop or bowel ischemia.   Specimens: no Retained items: no  EBL: minimal cc Complications: none   Description of procedure: After obtaining informed consent the patient was taken to the operating room and placed supine on operating room table where general endotracheal anesthesia was initiated, preoperative antibiotics were administered, SCDs applied, and a formal timeout was performed.  The abdomen was prepped and draped in usual sterile fashion and then peritoneal access was gained using a left subcostal optical entry.  Insufflation to 15 mmHg ensued uneventfully and the abdominal cavity was inspected and confirmed to be free of any injury from our entry.  Omental adhesions as well as small bowel adhesions to the anterior abdominal wall along the midline were noted.  After infiltration with local, 2 additional left-sided 5 mm trocars were placed and omental adhesions were taken down with the harmonic scalpel as well as blunt dissection.  Along the lower midline where dense small bowel adhesions.  Some of these were able to be taken down laparoscopically with the cold scissors, but ultimately a low midline laparotomy was necessary to complete adhesiolysis and expose the remainder of the small bowel.  Once laparotomy was created the remainder of the small bowel adhesions to the abdominal wall were taken down with  Metzenbaums and a wound protector was placed. The small bowel was eviscerated and a thick adhesive band was found extending from the proximal jejunum to the right lower quadrant pelvis. This was clearly the source of the obstruction and was divided with cautery. There were areas of small bowel adhesion to the mesentery which appeared to angulate the bowel but were not completely obstructing; these were liberated with sharp dissection as well. All the bowel appeared viable and there was no closed loop component. At this point the bowel was run from the ileocecal valve to the ligament of treitz, confirming no other point of obstruction. There were three small expected serosal tears which were addressed with seromuscular 3-0 vicryl imbricating sutures. The small bowel was returned to the abdominal cavity ensuring normal anatomic position. The transverse colon and omentum were brought down over the small bowel. The incision was closed with #1 PDS starting at either end and tying centrally. Hemostasis was ensured in the wound and the deep soft tissues were reapproximated with simple interrupted 3-0 Vicryl's.  All skin incisions were then closed with subcuticular 4-0 Monocryl followed by sterile dressings. The patient was then awakened, extubated and taken to PACU in stable condition.    All counts were correct at the completion of the case.

## 2024-01-25 ENCOUNTER — Encounter (HOSPITAL_COMMUNITY): Payer: Self-pay | Admitting: Surgery

## 2024-01-25 LAB — COMPREHENSIVE METABOLIC PANEL WITH GFR
ALT: 16 U/L (ref 0–44)
AST: 20 U/L (ref 15–41)
Albumin: 2.8 g/dL — ABNORMAL LOW (ref 3.5–5.0)
Alkaline Phosphatase: 38 U/L (ref 38–126)
Anion gap: 8 (ref 5–15)
BUN: 13 mg/dL (ref 6–20)
CO2: 22 mmol/L (ref 22–32)
Calcium: 8 mg/dL — ABNORMAL LOW (ref 8.9–10.3)
Chloride: 104 mmol/L (ref 98–111)
Creatinine, Ser: 0.71 mg/dL (ref 0.44–1.00)
GFR, Estimated: >60 mL/min (ref >60)
Glucose, Bld: 76 mg/dL (ref 70–99)
Potassium: 3.8 mmol/L (ref 3.5–5.1)
Sodium: 134 mmol/L — ABNORMAL LOW (ref 135–145)
Total Bilirubin: 1.9 mg/dL — ABNORMAL HIGH (ref 0.0–1.2)
Total Protein: 5.6 g/dL — ABNORMAL LOW (ref 6.5–8.1)

## 2024-01-25 LAB — CBC
HCT: 41 % (ref 36.0–46.0)
Hemoglobin: 12.9 g/dL (ref 12.0–15.0)
MCH: 31.2 pg (ref 26.0–34.0)
MCHC: 31.5 g/dL (ref 30.0–36.0)
MCV: 99.3 fL (ref 80.0–100.0)
Platelets: 237 10*3/uL (ref 150–400)
RBC: 4.13 MIL/uL (ref 3.87–5.11)
RDW: 12.8 % (ref 11.5–15.5)
WBC: 9.3 10*3/uL (ref 4.0–10.5)
nRBC: 0 % (ref 0.0–0.2)

## 2024-01-25 MED ORDER — OXYCODONE HCL 5 MG PO TABS
5.0000 mg | ORAL_TABLET | ORAL | Status: DC | PRN
  Administered 2024-01-25: 5 mg via ORAL
  Administered 2024-01-27: 10 mg via ORAL
  Administered 2024-01-27: 5 mg via ORAL
  Filled 2024-01-25: qty 2
  Filled 2024-01-25 (×2): qty 1

## 2024-01-25 NOTE — Discharge Instructions (Signed)

## 2024-01-25 NOTE — Anesthesia Postprocedure Evaluation (Signed)
 Anesthesia Post Note  Patient: Krista [name]  Procedure(s) Performed: LAPAROSCOPY, DIAGNOSTIC     Patient location during evaluation: PACU Anesthesia Type: General Level of consciousness: awake Pain management: pain level controlled Vital Signs Assessment: post-procedure vital signs reviewed and stable Respiratory status: spontaneous breathing, nonlabored ventilation and respiratory function stable Cardiovascular status: blood pressure returned to baseline and stable Postop Assessment: no apparent nausea or vomiting Anesthetic complications: no   No notable events documented.  Last Vitals:  Vitals:   01/25/24 0138 01/25/24 0507  BP: 103/71 (!) 93/59  Pulse: 94 88  Resp: 18 18  Temp: 37.1 C 36.9 C  SpO2: 97% 96%    Last Pain:  Vitals:   01/25/24 0809  TempSrc:   PainSc: 4                  Pearse Shiffler P Miriya Cloer

## 2024-01-25 NOTE — Progress Notes (Signed)
 Patient reviewed. Discussed patient with general surgery attending, Dr. Lanell Pinta. General surgery to assume primary care of patient post-op. [facility] made available for consultation, which is not needed at this time. [facility] available for consultation in the future as needed.  Aneita Keens, MD [facility] 01/25/2024, 7:41 AM

## 2024-01-25 NOTE — Progress Notes (Signed)
 Progress Note  1 Day Post-Op  Subjective: Pt reports passing some flatus overnight but none this AM. Ambulated in hall already this AM. Some incisional pain but overall doing well. Husband at bedside.   Objective: Vital signs in last 24 hours: Temp:  [97.5 F (36.4 C)-99.3 F (37.4 C)] 98.5 F (36.9 C) (06/18 0507) Pulse Rate:  [76-94] 88 (06/18 0507) Resp:  [7-18] 18 (06/18 0507) BP: (93-127)/(59-97) 93/59 (06/18 0507) SpO2:  [95 %-100 %] 96 % (06/18 0507) Last BM Date : 01/24/24  Intake/Output from previous day: 06/17 0701 - 06/18 0700 In: 2132.8 [I.V.:2032.8; IV Piggyback:100] Out: 2300 [Urine:2100; Emesis/NG output:175; Blood:25] Intake/Output this shift: Total I/O In: -  Out: 250 [Urine:200; Emesis/NG output:50]  PE: General: pleasant, WD, WN female who is laying in bed in NAD Heart: regular, rate, and rhythm.   Lungs: Respiratory effort nonlabored Abd: soft, ND, appropriately ttp, honeycomb to incision with small amount bloody drainage superiorly, NGT with thin clear appearing drainage  GU: foley present with straw colored urine  Psych: A&Ox3 with an appropriate affect.    Lab Results:  Recent Labs    01/24/24 0240 01/25/24 0448  WBC 8.5 9.3  HGB 14.1 12.9  HCT 43.8 41.0  PLT 297 237   BMET Recent Labs    01/24/24 0240 01/25/24 0448  NA 139 134*  K 4.3 3.8  CL 105 104  CO2 23 22  GLUCOSE 115* 76  BUN 14 13  CREATININE 0.73 0.71  CALCIUM 9.3 8.0*   PT/INR No results for input(s): LABPROT, INR in the last 72 hours. CMP     Component Value Date/Time   NA 134 (L) 01/25/2024 0448   NA 144 12/09/2022 1549   K 3.8 01/25/2024 0448   CL 104 01/25/2024 0448   CO2 22 01/25/2024 0448   GLUCOSE 76 01/25/2024 0448   BUN 13 01/25/2024 0448   BUN 17 12/09/2022 1549   CREATININE 0.71 01/25/2024 0448   CALCIUM 8.0 (L) 01/25/2024 0448   PROT 5.6 (L) 01/25/2024 0448   PROT 6.9 12/09/2022 1549   ALBUMIN 2.8 (L) 01/25/2024 0448   ALBUMIN 4.2  12/09/2022 1549   AST 20 01/25/2024 0448   ALT 16 01/25/2024 0448   ALKPHOS 38 01/25/2024 0448   BILITOT 1.9 (H) 01/25/2024 0448   BILITOT 0.3 12/09/2022 1549   GFRNONAA >60 01/25/2024 0448   GFRAA >60 05/18/2015 1740   Lipase     Component Value Date/Time   LIPASE 37 01/24/2024 0240       Studies/Results: DG Abd Portable 1V Result Date: 01/24/2024 CLINICAL DATA:  NG tube placement EXAM: PORTABLE ABDOMEN - 1 VIEW COMPARISON:  CT 01/24/2024 FINDINGS: Enteric tube tip at the level of the stomach, side-port in the region of GE junction. Scattered air-filled dilated small bowel. Numerous surgical clips in the upper abdomen. IMPRESSION: Enteric tube tip at the level of the stomach, side-port in the region of GE junction. Further advancement could be considered for more optimal positioning. Electronically Signed   By: Esmeralda Hedge M.D.   On: 01/24/2024 18:54   CT ABDOMEN PELVIS W CONTRAST Result Date: 01/24/2024 CLINICAL DATA:  43 year old female with abdominal pain radiating to the back for 1 day, reports symptoms similar to previous bowel obstruction. EXAM: CT ABDOMEN AND PELVIS WITH CONTRAST TECHNIQUE: Multidetector CT imaging of the abdomen and pelvis was performed using the standard protocol following bolus administration of intravenous contrast. RADIATION DOSE REDUCTION: This exam was performed according to the departmental  dose-optimization program which includes automated exposure control, adjustment of the mA and/or kV according to patient size and/or use of iterative reconstruction technique. CONTRAST:  100mL OMNIPAQUE IOHEXOL 300 MG/ML  SOLN COMPARISON:  None Available. FINDINGS: Lower chest: Negative. Hepatobiliary: Cholecystectomy. Mild postoperative bile duct prominence in the liver versus mild periportal edema (such as secondary to the bowel findings below). Liver enhancement remains normal. CBD is nondilated. Pancreas: Negative. Spleen: Negative. Adrenals/Urinary Tract: Negative.   Diminutive urinary bladder. Stomach/Bowel: Decompressed large bowel throughout the abdomen and pelvis, aside from occasional mild retained stool. Cecum mostly located in the pelvis. Decompressed terminal ileum on series 2, image 60. Appendix not identified, could be diminutive or absent. Decompressed terminal ileum and other very distal small bowel loops occupying the presacral space. These decompressed loops then track to the ventral peritoneum deep to the midline lower abdominal wall (series 2, image 52). Multiple additional fully decompressed small bowel loops there. And abrupt transition point subjacent to the ventral abdominal wall there seen on both coronal image 47 and series 2, image 47. Upstream dilated and fluid containing multiple small bowel loops measuring individually up to almost 3 cm diameter. Following retrograde, however there is a 2nd upstream small bowel transition point in the central abdomen seen on both coronal image 51 and series 2, image 38. Upstream of that proximal small bowel transition point the proximal jejunum is decompressed, but small-bowel loops become fluid-filled and mildly dilated leading up to that initial transition in the left upper quadrant. Postoperative changes along the greater curve of the stomach. Stomach and proximal duodenum with a small volume of retained fluid. No pneumoperitoneum. No free fluid identified in the abdomen. Vascular/Lymphatic: Major arterial structures in the abdomen and pelvis appear patent and normal. Portal venous system appears patent. No lymphadenopathy identified. Reproductive: Surgically absent uterus. Multiple uterine and adnexal region surgical clips. Normal left ovary at the left pelvic inlet series 2, image 54. Right ovary diminutive or absent. Other: Small volume of free fluid in the pelvis with simple fluid density on series 2, image 60. Musculoskeletal: Some lower thoracic flowing anterior endplate osteophytes. Otherwise negative.  IMPRESSION: 1. Positive for high-grade Acute Small Bowel Obstruction. But there are both proximal AND distal abrupt small bowel transition points (proximal on series 2 image 38, distal on image 47), with the maximal small bowel dilatation (nearly 3 cm) between those two transitions, and more mildly dilated proximal jejunum. This constellation strongly suggestive of CLOSED LOOP OBSTRUCTION, which might be due to adhesions rather than internal hernia given multiple prior bowel surgeries (#2). No pneumoperitoneum. Small volume of free fluid limited to the pelvis. Recommend Surgery consultation. 2. Chronic postoperative changes to the greater curve of the stomach, prior cholecystectomy, and prior hysterectomy. Questionable previous appendectomy also. Electronically Signed   By: Marlise Simpers M.D.   On: 01/24/2024 05:36    Anti-infectives: Anti-infectives (From admission, onward)    Start     Dose/Rate Route Frequency Ordered Stop   01/24/24 0830  ceFAZolin  (ANCEF ) IVPB 2g/100 mL premix        2 g 200 mL/hr over 30 Minutes Intravenous  Once 01/24/24 0824 01/24/24 1247        Assessment/Plan  Recurrent SBO POD1 diagnostic laparoscopy converted to laparotomy with LOA and small bowel serosal repair x3 Dr. Lanell Pinta - NGT with thin and low volume output - clamp this AM and allow sips and ice chips, possibly remove NGT this afternoon if patient tolerating well  - expected mild incisional pain -  mobilize as tolerated - DC foley today   FEN: ice chips and sips, NGT clamped, IVF @ 75 cc/h VTE: LMWH ID: [id number]  pre-op   LOS: 1 day     Annetta Killian, [facility] 01/25/2024, 10:36 AM Please see Amion for pager number during day hours 7:00am-4:30pm

## 2024-01-25 NOTE — Progress Notes (Signed)
   01/25/24 1132  TOC Brief Assessment  Insurance and Status Reviewed  Patient has primary care physician Yes  Home environment has been reviewed home with spouse and children  Prior level of function: independent  Prior/Current Home Services No current home services  Social Drivers of Health Review SDOH reviewed no interventions necessary  Readmission risk has been reviewed Yes  Transition of care needs no transition of care needs at this time

## 2024-01-25 NOTE — Plan of Care (Signed)
  Problem: Activity: Goal: Risk for activity intolerance will decrease Outcome: Progressing   Problem: Pain Managment: Goal: General experience of comfort will improve and/or be controlled Outcome: Progressing

## 2024-01-26 LAB — CBC
HCT: 38.2 % (ref 36.0–46.0)
Hemoglobin: 11.7 g/dL — ABNORMAL LOW (ref 12.0–15.0)
MCH: 30.8 pg (ref 26.0–34.0)
MCHC: 30.6 g/dL (ref 30.0–36.0)
MCV: 100.5 fL — ABNORMAL HIGH (ref 80.0–100.0)
Platelets: 263 10*3/uL (ref 150–400)
RBC: 3.8 MIL/uL — ABNORMAL LOW (ref 3.87–5.11)
RDW: 13 % (ref 11.5–15.5)
WBC: 7.7 10*3/uL (ref 4.0–10.5)
nRBC: 0 % (ref 0.0–0.2)

## 2024-01-26 LAB — BASIC METABOLIC PANEL WITH GFR
Anion gap: 11 (ref 5–15)
BUN: 10 mg/dL (ref 6–20)
CO2: 21 mmol/L — ABNORMAL LOW (ref 22–32)
Calcium: 8.2 mg/dL — ABNORMAL LOW (ref 8.9–10.3)
Chloride: 102 mmol/L (ref 98–111)
Creatinine, Ser: 0.71 mg/dL (ref 0.44–1.00)
GFR, Estimated: >60 mL/min (ref >60)
Glucose, Bld: 86 mg/dL (ref 70–99)
Potassium: 3.8 mmol/L (ref 3.5–5.1)
Sodium: 134 mmol/L — ABNORMAL LOW (ref 135–145)

## 2024-01-26 MED ORDER — ACETAMINOPHEN 325 MG PO TABS
650.0000 mg | ORAL_TABLET | Freq: Four times a day (QID) | ORAL | Status: DC
  Administered 2024-01-26 – 2024-01-27 (×5): 650 mg via ORAL
  Filled 2024-01-26 (×5): qty 2

## 2024-01-26 MED ORDER — METHOCARBAMOL 500 MG PO TABS
500.0000 mg | ORAL_TABLET | Freq: Four times a day (QID) | ORAL | Status: DC
  Administered 2024-01-26 – 2024-01-27 (×6): 500 mg via ORAL
  Filled 2024-01-26 (×6): qty 1

## 2024-01-26 NOTE — Progress Notes (Signed)
 Progress Note  2 Days Post-Op  Subjective: Pt reports not passing more flatus yesterday or today. Some belching but denies nausea. Abdominal soreness well controlled with current medications. She is ambulating frequently.   Objective: Vital signs in last 24 hours: Temp:  [98.4 F (36.9 C)-98.9 F (37.2 C)] 98.4 F (36.9 C) (06/19 0520) Pulse Rate:  [87-95] 90 (06/19 0520) Resp:  [16-18] 18 (06/19 0520) BP: (114-117)/(74-78) 117/78 (06/19 0520) SpO2:  [95 %-100 %] 99 % (06/19 0520) Last BM Date : 01/23/24  Intake/Output from previous day: 06/18 0701 - 06/19 0700 In: 1337.4 [P.O.:480; I.V.:857.4] Out: 1050 [Urine:1000; Emesis/NG output:50] Intake/Output this shift: No intake/output data recorded.  PE: General: pleasant, WD, WN female who is laying in bed in NAD Heart: regular, rate, and rhythm.   Lungs: Respiratory effort nonlabored Abd: soft, ND, appropriately ttp, honeycomb to incision with small amount bloody drainage superiorly Psych: A&Ox3 with an appropriate affect.    Lab Results:  Recent Labs    01/25/24 0448 01/26/24 0446  WBC 9.3 7.7  HGB 12.9 11.7*  HCT 41.0 38.2  PLT 237 263   BMET Recent Labs    01/25/24 0448 01/26/24 0446  NA 134* 134*  K 3.8 3.8  CL 104 102  CO2 22 21*  GLUCOSE 76 86  BUN 13 10  CREATININE 0.71 0.71  CALCIUM 8.0* 8.2*   PT/INR No results for input(s): LABPROT, INR in the last 72 hours. CMP     Component Value Date/Time   NA 134 (L) 01/26/2024 0446   NA 144 12/09/2022 1549   K 3.8 01/26/2024 0446   CL 102 01/26/2024 0446   CO2 21 (L) 01/26/2024 0446   GLUCOSE 86 01/26/2024 0446   BUN 10 01/26/2024 0446   BUN 17 12/09/2022 1549   CREATININE 0.71 01/26/2024 0446   CALCIUM 8.2 (L) 01/26/2024 0446   PROT 5.6 (L) 01/25/2024 0448   PROT 6.9 12/09/2022 1549   ALBUMIN 2.8 (L) 01/25/2024 0448   ALBUMIN 4.2 12/09/2022 1549   AST 20 01/25/2024 0448   ALT 16 01/25/2024 0448   ALKPHOS 38 01/25/2024 0448   BILITOT  1.9 (H) 01/25/2024 0448   BILITOT 0.3 12/09/2022 1549   GFRNONAA >60 01/26/2024 0446   GFRAA >60 05/18/2015 1740   Lipase     Component Value Date/Time   LIPASE 37 01/24/2024 0240       Studies/Results: DG Abd Portable 1V Result Date: 01/24/2024 CLINICAL DATA:  NG tube placement EXAM: PORTABLE ABDOMEN - 1 VIEW COMPARISON:  CT 01/24/2024 FINDINGS: Enteric tube tip at the level of the stomach, side-port in the region of GE junction. Scattered air-filled dilated small bowel. Numerous surgical clips in the upper abdomen. IMPRESSION: Enteric tube tip at the level of the stomach, side-port in the region of GE junction. Further advancement could be considered for more optimal positioning. Electronically Signed   By: Esmeralda Hedge M.D.   On: 01/24/2024 18:54    Anti-infectives: Anti-infectives (From admission, onward)    Start     Dose/Rate Route Frequency Ordered Stop   01/24/24 0830  ceFAZolin  (ANCEF ) IVPB 2g/100 mL premix        2 g 200 mL/hr over 30 Minutes Intravenous  Once 01/24/24 0824 01/24/24 1247        Assessment/Plan  Recurrent SBO POD2 diagnostic laparoscopy converted to laparotomy with LOA and small bowel serosal repair x3 Dr. Lanell Pinta - keep on CLD until more bowel function  - expected mild incisional pain -  mobilize as tolerated   FEN: CLD, SLIV VTE: LMWH ID: ancef  pre-op   LOS: 2 days     Annetta Killian, [facility] 01/26/2024, 9:12 AM Please see Amion for pager number during day hours 7:00am-4:30pm

## 2024-01-26 NOTE — Plan of Care (Signed)
  Problem: Health Behavior/Discharge Planning: Goal: Ability to manage health-related needs will improve Outcome: Completed/Met   Problem: Clinical Measurements: Goal: Respiratory complications will improve Outcome: Completed/Met   Problem: Activity: Goal: Risk for activity intolerance will decrease Outcome: Completed/Met   Problem: Nutrition: Goal: Adequate nutrition will be maintained Outcome: Progressing

## 2024-01-27 MED ORDER — OXYCODONE HCL 5 MG PO TABS: 5.0000 mg | ORAL_TABLET | ORAL | 0 refills | Status: AC | PRN

## 2024-01-27 MED ORDER — ACETAMINOPHEN 325 MG PO TABS: 650.0000 mg | ORAL_TABLET | Freq: Four times a day (QID) | ORAL | Status: AC | PRN

## 2024-01-27 MED ORDER — METHOCARBAMOL 500 MG PO TABS: 500.0000 mg | ORAL_TABLET | Freq: Four times a day (QID) | ORAL | 0 refills | Status: AC | PRN

## 2024-01-27 NOTE — Progress Notes (Signed)
   Progress Note  3 Days Post-Op  Subjective: Pt started passing more flatus yesterday afternoon and had a few BMs overnight. Reports abdominal soreness but well controlled. Denies nausea, vomiting or worsening distention. Husband at bedside. Hopeful to go home this afternoon.   Objective: Vital signs in last 24 hours: Temp:  [98 F (36.7 C)-98.5 F (36.9 C)] 98 F (36.7 C) (06/20 0546) Pulse Rate:  [79-84] 84 (06/20 0546) Resp:  [15-18] 18 (06/20 0546) BP: (92-125)/(62-84) 92/62 (06/20 0546) SpO2:  [99 %-100 %] 99 % (06/20 0546) Last BM Date : 01/26/24  Intake/Output from previous day: 06/19 0701 - 06/20 0700 In: 930 [P.O.:930] Out: 600 [Urine:600] Intake/Output this shift: No intake/output data recorded.  PE: General: pleasant, WD, WN female who is laying in bed in NAD Heart: regular, rate, and rhythm.   Lungs: Respiratory effort nonlabored Abd: soft, ND, appropriately ttp, incision C/D/I Psych: A&Ox3 with an appropriate affect.    Lab Results:  Recent Labs    01/25/24 0448 01/26/24 0446  WBC 9.3 7.7  HGB 12.9 11.7*  HCT 41.0 38.2  PLT 237 263   BMET Recent Labs    01/25/24 0448 01/26/24 0446  NA 134* 134*  K 3.8 3.8  CL 104 102  CO2 22 21*  GLUCOSE 76 86  BUN 13 10  CREATININE 0.71 0.71  CALCIUM 8.0* 8.2*   PT/INR No results for input(s): LABPROT, INR in the last 72 hours. CMP     Component Value Date/Time   NA 134 (L) 01/26/2024 0446   NA 144 12/09/2022 1549   K 3.8 01/26/2024 0446   CL 102 01/26/2024 0446   CO2 21 (L) 01/26/2024 0446   GLUCOSE 86 01/26/2024 0446   BUN 10 01/26/2024 0446   BUN 17 12/09/2022 1549   CREATININE 0.71 01/26/2024 0446   CALCIUM 8.2 (L) 01/26/2024 0446   PROT 5.6 (L) 01/25/2024 0448   PROT 6.9 12/09/2022 1549   ALBUMIN 2.8 (L) 01/25/2024 0448   ALBUMIN 4.2 12/09/2022 1549   AST 20 01/25/2024 0448   ALT 16 01/25/2024 0448   ALKPHOS 38 01/25/2024 0448   BILITOT 1.9 (H) 01/25/2024 0448   BILITOT 0.3  12/09/2022 1549   GFRNONAA >60 01/26/2024 0446   GFRAA >60 05/18/2015 1740   Lipase     Component Value Date/Time   LIPASE 37 01/24/2024 0240       Studies/Results: No results found.   Anti-infectives: Anti-infectives (From admission, onward)    Start     Dose/Rate Route Frequency Ordered Stop   01/24/24 0830  ceFAZolin  (ANCEF ) IVPB 2g/100 mL premix        2 g 200 mL/hr over 30 Minutes Intravenous  Once 01/24/24 0824 01/24/24 1247        Assessment/Plan  Recurrent SBO POD3 diagnostic laparoscopy converted to laparotomy with LOA and small bowel serosal repair x3 Dr. Lanell Pinta - tolerating FLD overnight and more bowel function - advance to soft diet  - expected mild incisional pain - mobilize as tolerated  - possible DC later today if tolerating soft diet   FEN: soft  VTE: LMWH ID: ancef  pre-op   LOS: 3 days     Annetta Killian, [facility] 01/27/2024, 9:08 AM Please see Amion for pager number during day hours 7:00am-4:30pm

## 2024-01-27 NOTE — Discharge Summary (Signed)
 [facility] Discharge Summary   Patient ID: Krista [name] MRN: [id number] DOB/AGE: Nov 29, 1980 43 y.o.  Admit date: 01/24/2024 Discharge date: 01/27/2024  Admitting Diagnosis: Recurrent SBO  Discharge Diagnosis Same  Consultants None   Imaging: No results found.  Procedures Dr. Aldon Hung (01/24/24) - Diagnostic laparoscopy, laparotomy, LOA, repair of small bowel serosal injuries x3  Hospital Course:  Patient is a 43 year old female who [facility] to [facility] with abdominal pain and nausea after recent admission at another hospital 3 weeks ago for similar.  Workup showed concern for SBO.  Patient was admitted and underwent procedure listed above.  Tolerated procedure well and was transferred to the floor.  Diet was advanced as tolerated.  On POD3, the patient was voiding well, tolerating diet, ambulating well, pain well controlled, vital signs stable, incisions c/d/i and felt stable for discharge home.  Patient will follow up in our office in 3-4 weeks and knows to call with questions or concerns.     I or a member of my team have reviewed this patient in the Controlled Substance Database.   Allergies as of 01/27/2024       Reactions   Decadron [dexamethasone] Anaphylaxis   Medrol  [methylprednisolone ] Anaphylaxis   Other Anaphylaxis   Pt states she is allergic to all steroids    Prednisone Hives, Itching, Swelling, Other (See Comments)   Lips and tongue swell   Chlorhexidine          Medication List     STOP taking these medications    albuterol  (2.5 MG/3ML) 0.083% nebulizer solution Commonly known as: PROVENTIL    famotidine  20-0.9 MG/50ML-% Commonly known as: PEPCID    fexofenadine  180 MG tablet Commonly known as: ALLEGRA        TAKE these medications    acetaminophen  325 MG tablet Commonly known as: TYLENOL  Take 2 tablets (650 mg total) by mouth every 6 (six) hours as needed for mild pain (pain score 1-3) or headache.   buPROPion  300 MG 24 hr  tablet Commonly known as: WELLBUTRIN  XL Take 300 mg by mouth daily.   EpiPen  2-Pak 0.3 mg/0.3 mL Soaj injection Generic drug: EPINEPHrine  Inject 0.3 mg into the muscle as needed for anaphylaxis.   EPINEPHrine  0.3 mg/0.3 mL Soaj injection Commonly known as: EpiPen  2-Pak Inject 0.3 mg into the muscle as needed for anaphylaxis.   Neffy  2 MG/0.1ML Soln Generic drug: EPINEPHrine  Place 1 Inhalation into the nose as needed (Anaphylaxis).   estradiol  1 MG tablet Commonly known as: ESTRACE  Take 1 mg by mouth daily.   Eucrisa  2 % Oint Generic drug: Crisaborole  Apply 1 Application topically 2 (two) times daily as needed (mild rash).   Gemtesa 75 MG Tabs Generic drug: Vibegron Take 1 tablet by mouth daily.   phentermine 15 MG capsule Take 15 mg by mouth daily.   LOMAIRA PO Take 15 mg by mouth daily at 6 (six) AM.   LORazepam 0.5 MG tablet Commonly known as: ATIVAN Take 0.5 mg by mouth as needed for anxiety.   methocarbamol  500 MG tablet Commonly known as: ROBAXIN  Take 1 tablet (500 mg total) by mouth every 6 (six) hours as needed for muscle spasms.   omalizumab  150 MG/ML prefilled syringe Commonly known as: Xolair  Inject 300 mg into the skin every 28 (twenty-eight) days.   omeprazole 40 MG capsule Commonly known as: PRILOSEC Take 40 mg by mouth daily.   oxyCODONE  5 MG immediate release tablet Commonly known as: Oxy IR/ROXICODONE  Take 1-2 tablets (5-10 mg total) by  mouth every 4 (four) hours as needed for moderate pain (pain score 4-6) or severe pain (pain score 7-10).   phenazopyridine 200 MG tablet Commonly known as: PYRIDIUM Take 200 mg by mouth 4 (four) times daily.   topiramate 50 MG tablet Commonly known as: TOPAMAX Take by mouth.   Vilazodone HCl 10 MG Tabs Commonly known as: VIIBRYD Take 10 mg by mouth daily.          Follow-up Information     Adalberto Acton, MD. Go on 02/23/2024.   Specialty: General Surgery Why: 9:50 AM, please arrive by 9:20  AM to complete check in. Contact information: [address] [place] [address] [phone]                 Signed: Annetta Killian , [facility] 01/27/2024, 9:13 AM Please see Amion for pager number during day hours 7:00am-4:30pm

## 2024-01-31 ENCOUNTER — Other Ambulatory Visit: Payer: Self-pay | Admitting: *Deleted

## 2024-01-31 MED ORDER — OMALIZUMAB 150 MG/ML ~~LOC~~ SOSY
300.0000 mg | PREFILLED_SYRINGE | SUBCUTANEOUS | 11 refills | Status: AC
Start: 2024-01-31 — End: ?

## 2024-02-01 ENCOUNTER — Encounter: Payer: Self-pay | Admitting: Allergy

## 2024-02-03 ENCOUNTER — Ambulatory Visit
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
## 2024-02-23 NOTE — Progress Notes (Signed)
 [name] [address] [place] [address] Phone: [phone] Subjective:   Krista [name], am serving as a scribe for Dr. Arthea Claudene.  I'm seeing this patient by the request  of:  Krista Lamar LABOR, MD  CC: Upper back pain with acute low back pain.  Krista  Krista [name] is a 43 y.o. female coming in with complaint of back and neck pain. OMT 12/27/2023. SBO on 01/02/2024. Patient states that she is having an increase in lower back pain. Pain is worse by end of the day.  Since we have seen the patient had another small bowel obstruction and needed for lysis of adhesions.  Repeat denies any radiating symptoms.   Medications patient has been prescribed: None          Reviewed prior external information including notes and imaging from previsou exam, outside providers and external EMR if available.   As well as notes that were available from care everywhere and other healthcare systems.  Past medical history, social, surgical and family history all reviewed in electronic medical record.  No pertanent information unless stated regarding to the chief complaint.   Past Medical History:  Diagnosis Date   Asthma    exercise induced   Complication of anesthesia    DVT (deep venous thrombosis) (HCC)    Family history of adverse reaction to anesthesia    PONV   Obesity    PONV (postoperative nausea and vomiting)    Urticaria     Allergies  Allergen Reactions   Decadron  [Dexamethasone ] Anaphylaxis   Medrol  [Methylprednisolone ] Anaphylaxis   Other Anaphylaxis    Pt states she is allergic to all steroids    Prednisone Hives, Itching, Swelling and Other (See Comments)    Lips and tongue swell     Chlorhexidine       Review of Systems:  No headache, visual changes, nausea, vomiting, diarrhea, constipation, dizziness, abdominal pain, skin rash, fevers, chills, night sweats, weight loss, swollen lymph nodes, body aches, joint  swelling, chest pain, shortness of breath, mood changes. POSITIVE muscle aches  Objective  Blood pressure 110/80, pulse 68, height 4' 11 (1.499 m), weight 127 lb (57.6 kg), last menstrual period 09/06/2014, SpO2 98%, unknown if currently breastfeeding.   General: No apparent distress alert and oriented x3 mood and affect normal, dressed appropriately.  HEENT: Pupils equal, extraocular movements intact  Respiratory: Patient's speak in full sentences and does not appear short of breath  Cardiovascular: No lower extremity edema, non tender, no erythema  Gait MSK:  Back does have some loss of lordosis noted.  Some tenderness to palpation in the paraspinal musculature.  Patient does have more discomfort in the lower back than usual.  Osteopathic findings  C2 flexed rotated and side bent right C6 flexed rotated and side bent left T3 extended rotated and side bent right inhaled rib T9 extended rotated and side bent left L2 flexed rotated and side bent right L4 flexed rotated and side bent left Sacrum right on right       Assessment and Plan:  Low back pain Low back pain with no true radicular symptoms.  Factorial with patient having recent bowel surgery again for adhesions.  Discussed with patient about icing regimen, discussed core strengthening exercises daily.  Discussed ergonomics as well.  Increase activity slowly.  Follow-up again in 6 to 8 weeks no change in medications.  Discussed the importance of the bowel regimen.    Nonallopathic problems  Decision today to treat with OMT was based on Physical Exam  After verbal consent patient was treated with HVLA, ME, FPR techniques in cervical, rib, thoracic, lumbar, and sacral  areas  Patient tolerated the procedure well with improvement in symptoms  Patient given exercises, stretches and lifestyle modifications  See medications in patient instructions if given  Patient will follow up in 4-8 weeks    The above documentation  has been reviewed and is accurate and complete Krista Dezarn M Maryalice Pasley, DO          Note: This dictation was prepared with Dragon dictation along with smaller phrase technology. Any transcriptional errors that result from this process are unintentional.

## 2024-02-28 ENCOUNTER — Encounter: Payer: Self-pay | Admitting: Allergy

## 2024-02-28 MED ORDER — NEFFY 2 MG/0.1ML NA SOLN: 1.0000 | NASAL | 1 refills | Status: AC | PRN

## 2024-02-29 ENCOUNTER — Encounter: Payer: Self-pay | Admitting: Family Medicine

## 2024-02-29 ENCOUNTER — Ambulatory Visit (INDEPENDENT_AMBULATORY_CARE_PROVIDER_SITE_OTHER): Admitting: Family Medicine

## 2024-02-29 VITALS — BP 110/80 | HR 68 | Ht 59.0 in | Wt 127.0 lb

## 2024-02-29 DIAGNOSIS — K56609 Unspecified intestinal obstruction, unspecified as to partial versus complete obstruction: Secondary | ICD-10-CM

## 2024-02-29 DIAGNOSIS — M9901 Segmental and somatic dysfunction of cervical region: Secondary | ICD-10-CM | POA: Diagnosis not present

## 2024-02-29 DIAGNOSIS — M9908 Segmental and somatic dysfunction of rib cage: Secondary | ICD-10-CM | POA: Diagnosis not present

## 2024-02-29 DIAGNOSIS — M9904 Segmental and somatic dysfunction of sacral region: Secondary | ICD-10-CM

## 2024-02-29 DIAGNOSIS — M9902 Segmental and somatic dysfunction of thoracic region: Secondary | ICD-10-CM

## 2024-02-29 DIAGNOSIS — M9903 Segmental and somatic dysfunction of lumbar region: Secondary | ICD-10-CM

## 2024-02-29 DIAGNOSIS — M545 Low back pain, unspecified: Secondary | ICD-10-CM | POA: Insufficient documentation

## 2024-02-29 NOTE — Assessment & Plan Note (Signed)
 Low back pain with no true radicular symptoms.  Factorial with patient having recent bowel surgery again for adhesions.  Discussed with patient about icing regimen, discussed core strengthening exercises daily.  Discussed ergonomics as well.  Increase activity slowly.  Follow-up again in 6 to 8 weeks no change in medications.  Discussed the importance of the bowel regimen.

## 2024-02-29 NOTE — Patient Instructions (Signed)
 Good to see you Keep stomach working See me again in 6-8 weeks

## 2024-03-30 ENCOUNTER — Other Ambulatory Visit: Payer: Self-pay | Admitting: Family Medicine

## 2024-03-30 DIAGNOSIS — Z1231 Encounter for screening mammogram for malignant neoplasm of breast: Secondary | ICD-10-CM

## 2024-03-30 NOTE — Progress Notes (Signed)
 SUBJECTIVE   Patient ID: Krista [name] is a 43 y.o. (DOB 22-Feb-1981) female with a past medical history of IBS, status post gastric sleeve procedure, GERD, depression who presents for evaluation of left knee pain  Chief Complaint  Patient presents with  . Knee Pain   Knee Pain: Patient presents with a knee injury involving the  left knee. Onset of the symptoms was a week ago. Inciting event: injured during a fall while running in tall grass trying to get her dog back inside. Current symptoms include popping sensation and swelling. Pain is aggravated by any weight bearing but also the pain has been waking her up at night.  Patient has had no prior knee problems. Evaluation to date: none. Treatment to date: rest, unable to take Prednisone or NSAIDs.  OBJECTIVE   Allergies[1]  Current Medications[2]  Problem List[3]  Medical History[4]    Surgical History[5]  Family History[6]  BP 112/78 (BP Location: Left arm, Patient Position: Sitting)   Pulse 98   Ht 1.499 m (4' 11)   Wt 59 kg (130 lb 1.6 oz)   SpO2 99%   BMI 26.28 kg/m   Most recent PHQ-2 results: Patient Health Questionnaire-2 Score: 0 (03/30/2024 11:07 AM)  Most recent PHQ-9 result:   PHQ-9 Question # 9   Interpretation: PHQ-2 Interpretation: Negative (None-minimal Depression Severity) (03/30/2024 11:07 AM)    Depression Plan: Normal/Negative Screening   Health Maintenance  Topic Date Due  . Comprehensive Annual Visit  Never done  . Varicella Vaccines (1 of 2 - 13+ 2-dose series) Never done  . Influenza Vaccine (1) 02/05/2025 (Originally 03/09/2024)  . Hepatitis B Vaccines (3 of 3 - 3-dose series) 03/30/2025 (Originally 06/17/1998)  . HPV Vaccines (1 - 3-dose SCDM series) 03/30/2025 (Originally 09/27/2006)  . Hepatitis C Screening  03/30/2025 (Originally 09/27/1997)  . COVID-19 Vaccine (4 - 2024-25 season) 03/30/2025 (Originally 04/10/2023)  . Diabetes Screening  01/01/2025  . Depression Screening   03/30/2025  . Breast Cancer Screening (Mammogram)  05/04/2025  . DTaP/Tdap/Td Vaccines (4 - Td or Tdap) 10/25/2028  . Adult RSV (60+ Years or Pregnancy) (1 - 1-dose 75+ series) 09/27/2054  . HIV Screening  Completed  . Pneumococcal Vaccine: Pediatrics (0 to 5 years) and At-Risk Patients (6-49 Years)  Aged Out  . HIB Vaccines  Aged Out  . IPV Vaccines  Aged Out  . Hepatitis A Vaccines  Aged Out  . Meningococcal Conjugate (ACWY) Vaccine  Aged Out  . Rotavirus Vaccines  Aged Out  . Meningococcal B Vaccine  Aged Out  . Cervical Cancer Screening  Discontinued    Review of Systems A complete ROS was performed with positive/negative pertinent findings listed in the HPI. All other systems are negative.  Review of Systems  Constitutional: Negative.   HENT: Negative.    Eyes: Negative.   Respiratory: Negative.    Cardiovascular: Negative.   Gastrointestinal: Negative.   Endocrine: Negative.   Genitourinary: Negative.   Musculoskeletal:        Left knee pain  Skin: Negative.   Allergic/Immunologic: Negative.   Neurological: Negative.   Hematological: Negative.   Psychiatric/Behavioral: Negative.      Constitutional:  NAD.  Appears well-developed, well groomed and well nourished. HEENT: Oropharynx with moist mucous membranes Cardiovascular: Normal rate and rhythm.   Pulmonary/chest: Effort normal and breath sounds normal. Lungs clear to auscultation,  Musculo-Skeletal: Left knee with full range of motion.  Normal popliteal fossa.  Medial joint line tenderness palpation.  No crepitation.  Extremities: No edema bilaterally. Skin: Warm and dry. No rashes noted.  Neuro:  Alert and oriented x 3, CN grossly intact.  Psychiatric: Normal mood and affect. Behavior normal. Judgement and thought content normal.    ASSESSMENT/PLAN   1. Injury of left knee, initial encounter (Primary)  - XR Knee 4+ Views Left; no acute fracture.  No malalignment.  No joint effusion.  Joint spaces are  maintained. - MRI Knee Left WO Contrast; Future -Further recommendations pending results of MRI.   Risks, benefits, and alternatives of the medication(s) and treatment plan(s) were discussed, and she expressed understanding. Plan follow up as discussed or as needed. No barriers to treatment identified in this visit.     This document serves as a record of services personally performed by Dr. Lamar Simpers.  It was created on their behalf by Darryle Chester, a trained medical scribe, and Certified Medical Assistant (CMA). During the course of documenting the history, physical exam and medical decision making, I was functioning as a Stage manager. The creation of this record is the provider's dictation and/or activities during the visit.    Electronically signed by: Darryle Sherwin Chester, CMA 03/30/2024 11:31 AM   I agree the documentation is accurate and complete.  Electronically signed by: Lamar DELENA Simpers, MD 04/02/2024 5:47 AM          [1] Allergies Allergen Reactions  . Dexamethasone  Anaphylaxis  . Methylprednisolone  Anaphylaxis  . Prednisone Itching, Swelling, Hives and Other (See Comments)    Swelling of lips  Lips and tongue swell  . Chlorhexidine    [2] Current Outpatient Medications  Medication Sig Dispense Refill  . albuterol  HFA (PROVENTIL  HFA;VENTOLIN  HFA;PROAIR  HFA) 90 mcg/actuation inhaler Inhale 2 puffs as needed for wheezing Indications: exercise-induced bronchospasm prevention.    . buPROPion  (WELLBUTRIN  XL) 300 mg 24 hr tablet Take 1 tablet (300 mg total) by mouth daily. 90 tablet 1  . desvenlafaxine (PRISTIQ) 25 mg Tb24 24 hr tablet Take 1 tablet (25 mg total) by mouth daily. 30 tablet 0  . EPINEPHrine  (EPIPEN ) 0.3 mg/0.3 mL injection syringe Inject 0.3 mg into the thigh.    . estradioL  (Estrace ) 1 mg tablet Take 1 tablet (1 mg total) by mouth daily. 90 tablet 3  . omeprazole (PriLOSEC) 40 mg DR capsule Take 1 capsule (40 mg total) by mouth daily. 90 capsule 3   . phentermine 37.5 mg tab Take 0.5 tablets (18.75 mg total) by mouth daily. 15 tablet 1  . topiramate (TOPAMAX) 50 mg tablet Take 1 tablet (50 mg total) by mouth daily before dinner. 90 tablet 0  . vibegron (Gemtesa) 75 mg tab Take one tablet daily for urinary urgency and incontinence. 30 tablet 11   No current facility-administered medications for this visit.  [3] Patient Active Problem List Diagnosis  . History of DVT (deep vein thrombosis)  . Family history of gene mutation  . Severe obesity (BMI 35.0-35.9 with comorbidity) (HCC)  . History of cesarean section complicating pregnancy  . Anemia affecting pregnancy in third trimester  . 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 Menopausal symptoms  . Overactive bladder  . Urge incontinence of urine  . Encounter to establish care  . Lipid screening  . Irritable bowel syndrome with diarrhea  . Anxiety  . S/P gastric sleeve procedure  . Chronic vulvitis  . Weakness of both lower extremities  . SBO (small bowel obstruction)    (CMD)  [4] Past Medical History: Diagnosis Date  . Abnormal Pap smear of  cervix   . Anemia affecting pregnancy in third trimester (CMD)   . Anxiety with depression   . Asthma (CMD)   . Blood dyscrasia   . Complication of anesthesia   . DVT of leg (deep venous thrombosis)    (CMD)   . Hypertension   . IBS (irritable bowel syndrome)   . Morbid obesity (CMD)   . PONV (postoperative nausea and vomiting)   [5] Past Surgical History: Procedure Laterality Date  . CESAREAN SECTION    . CHOLECYSTECTOMY    . CYSTOSCOPY    . CYSTOSCOPY    . DILATION AND CURETTAGE OF UTERUS    . KNEE ARTHROSCOPY W/ MENISCAL REPAIR    . RADICAL HYSTERECTOMY     modified radical hysterectomy  . REDUCTION MAMMAPLASTY  11/02/2022  . STOMACH SURGERY     gastric sleeve  [6] No family history on file.

## 2024-04-11 NOTE — Progress Notes (Signed)
 Patient came by today to leave a urine specimen.  She has been having urinary frequency and retention.  UA positive, Medication sent and culture sent to lab per Dr. Silver.  Patient notified by phone.

## 2024-04-12 NOTE — Progress Notes (Unsigned)
 [name] [address] [place] [address] Phone: [phone] Subjective:   Krista [name], am serving as a scribe for Dr. Arthea Claudene.  I'm seeing this patient by the request  of:  Silver Lamar LABOR, MD  CC: Left knee pain, back pain neck pain  YEP:Krista [name]  Krista [name] is a 43 y.o. female coming in with complaint of back and neck pain. OMT 02/29/2024. Patient states that she has burning between scapula.   Medications patient has been prescribed: None  Taking:         Reviewed prior external information including notes and imaging from previsou exam, outside providers and external EMR if available.   As well as notes that were available from care everywhere and other healthcare systems.  Past medical history, social, surgical and family history all reviewed in electronic medical record.  No pertanent information unless stated regarding to the chief complaint.   Past Medical History:  Diagnosis Date   Asthma    exercise induced   Complication of anesthesia    DVT (deep venous thrombosis) (HCC)    Family history of adverse reaction to anesthesia    PONV   Obesity    PONV (postoperative nausea and vomiting)    Urticaria     Allergies  Allergen Reactions   Decadron  [Dexamethasone ] Anaphylaxis   Medrol  [Methylprednisolone ] Anaphylaxis   Other Anaphylaxis    Pt states she is allergic to all steroids    Prednisone Hives, Itching, Swelling and Other (See Comments)    Lips and tongue swell     Chlorhexidine       Review of Systems:  No headache, visual changes, nausea, vomiting, diarrhea, constipation, dizziness, abdominal pain, skin rash, fevers, chills, night sweats, weight loss, swollen lymph nodes, body aches, joint swelling, chest pain, shortness of breath, mood changes. POSITIVE muscle aches  Objective  Blood pressure 112/74, pulse 79, height 4' 11 (1.499 m), weight 133 lb (60.3 kg), last menstrual period  09/06/2014, SpO2 97%, unknown if currently breastfeeding.   General: No apparent distress alert and oriented x3 mood and affect normal, dressed appropriately.  HEENT: Pupils equal, extraocular movements intact  Respiratory: Patient's speak in full sentences and does not appear short of breath  Cardiovascular: No lower extremity edema, non tender, no erythema  Neck exam does have some loss of lordosis noted.  Patient does have some tightness noted in the parascapular area.  This seems to be right greater than left.  Patient's low back severe tenderness to palpation over the left sacroiliac joint.  Left knee does show that there is a trace effusion noted.  Lacks last 5 degrees of extension.  Lateral tracking noted.   Osteopathic findings  C3 flexed rotated and side bent right C6 flexed rotated and side bent left T3 extended rotated and side bent right inhaled rib L4 flexed rotated and side bent right Sacrum right on right   [id number]; 15 additional minutes spent for Therapeutic exercises as stated in above notes.  This included exercises focusing on stretching, strengthening, with significant focus on eccentric aspects.   Long term goals include an improvement in range of motion, strength, endurance as well as avoiding reinjury. Patient's frequency would include in 1-2 times a day, 3-5 times a week for a duration of 6-12 weeks. Reviewed anatomy using anatomical model and how PFS occurs.  Given rehab exercises handout for VMO, hip abductors, core, entire kinetic chain including proprioception exercises.  Could benefit from PT, regular  exercise, upright biking, and a PFS knee brace to assist with tracking abnormalities.  Proper technique shown and discussed handout in great detail with ATC.  All questions were discussed and answered.       Assessment and Plan:  Scapular dyskinesis Discussed dyskinesis still noted.  Discussed icing regimen and home exercises, discussed which activities  reviewed which ones to avoid.  Patient is going continue to work on posture and ergonomics, increase activity when possible.  Follow-up again in 6 to 8 weeks.  Lateral subluxation of left patella Patient had workup previously.  MRI from outside facility only showed small effusion.  I believe the patient does have the sequelae of a lateral subluxation with patient's hypermobility.  Given a Tru pull lite brace and VMO exercises.  Follow-up again in 2 months worsening pain and injection necessary    Nonallopathic problems  Decision today to treat with OMT was based on Physical Exam  After verbal consent patient was treated with HVLA, ME, FPR techniques in cervical, rib, thoracic, lumbar, and sacral  areas  Patient tolerated the procedure well with improvement in symptoms  Patient given exercises, stretches and lifestyle modifications  See medications in patient instructions if given  Patient will follow up in 4-8 weeks     The above documentation has been reviewed and is accurate and complete Krista [name] M Krista Saathoff, DO         Note: This dictation was prepared with Dragon dictation along with smaller phrase technology. Any transcriptional errors that result from this process are unintentional.

## 2024-04-13 ENCOUNTER — Encounter: Payer: Self-pay | Admitting: Family Medicine

## 2024-04-13 ENCOUNTER — Ambulatory Visit (INDEPENDENT_AMBULATORY_CARE_PROVIDER_SITE_OTHER): Admitting: Family Medicine

## 2024-04-13 VITALS — BP 112/74 | HR 79 | Ht 59.0 in | Wt 133.0 lb

## 2024-04-13 DIAGNOSIS — S83012A Lateral subluxation of left patella, initial encounter: Secondary | ICD-10-CM | POA: Diagnosis not present

## 2024-04-13 DIAGNOSIS — M9904 Segmental and somatic dysfunction of sacral region: Secondary | ICD-10-CM

## 2024-04-13 DIAGNOSIS — M9901 Segmental and somatic dysfunction of cervical region: Secondary | ICD-10-CM

## 2024-04-13 DIAGNOSIS — M9908 Segmental and somatic dysfunction of rib cage: Secondary | ICD-10-CM | POA: Diagnosis not present

## 2024-04-13 DIAGNOSIS — G2589 Other specified extrapyramidal and movement disorders: Secondary | ICD-10-CM

## 2024-04-13 DIAGNOSIS — M9903 Segmental and somatic dysfunction of lumbar region: Secondary | ICD-10-CM | POA: Diagnosis not present

## 2024-04-13 DIAGNOSIS — M9902 Segmental and somatic dysfunction of thoracic region: Secondary | ICD-10-CM

## 2024-04-13 NOTE — Assessment & Plan Note (Signed)
 Patient had workup previously.  MRI from outside facility only showed small effusion.  I believe the patient does have the sequelae of a lateral subluxation with patient's hypermobility.  Given a Tru pull lite brace and VMO exercises.  Follow-up again in 2 months worsening pain and injection necessary

## 2024-04-13 NOTE — Assessment & Plan Note (Signed)
 Discussed dyskinesis still noted.  Discussed icing regimen and home exercises, discussed which activities reviewed which ones to avoid.  Patient is going continue to work on posture and ergonomics, increase activity when possible.  Follow-up again in 6 to 8 weeks.

## 2024-04-13 NOTE — Patient Instructions (Signed)
 Do prescribed exercises at least 3x a week You have 14 days to return or exchange your brace Call [phone], then return the brace to our office  [facility]  See you again in 2 months

## 2024-04-17 ENCOUNTER — Encounter: Payer: Self-pay | Admitting: Family Medicine

## 2024-04-27 ENCOUNTER — Ambulatory Visit: Admitting: Family Medicine

## 2024-05-08 ENCOUNTER — Ambulatory Visit
Admission: RE | Admit: 2024-05-08 | Discharge: 2024-05-08 | Disposition: A | Discharge status: Home / Self Care | Source: Ambulatory Visit | Attending: Family Medicine | Admitting: Family Medicine

## 2024-05-08 DIAGNOSIS — Z1231 Encounter for screening mammogram for malignant neoplasm of breast: Secondary | ICD-10-CM

## 2024-05-28 NOTE — Progress Notes (Unsigned)
 [name] [address] [place] [address] Phone: [phone] Subjective:   Krista [name], am serving as a scribe for Dr. Arthea Claudene.  I'm seeing this patient by the request  of:  Silver Lamar LABOR, MD  CC: back and neck pain follow up   YEP:Dlagzrupcz  Krista [name] is a 43 y.o. female coming in with complaint of back and neck pain. OMT 04/13/2024. Patient states that she is having more pain in mid-back. Pain is constant and worse with lumbar flexion. Denies any radiating symptoms.   Medications patient has been prescribed: none  Taking:         Reviewed prior external information including notes and imaging from previsou exam, outside providers and external EMR if available.   As well as notes that were available from care everywhere and other healthcare systems.  Past medical history, social, surgical and family history all reviewed in electronic medical record.  No pertanent information unless stated regarding to the chief complaint.   Past Medical History:  Diagnosis Date   Asthma    exercise induced   Complication of anesthesia    DVT (deep venous thrombosis) (HCC)    Family history of adverse reaction to anesthesia    PONV   Obesity    PONV (postoperative nausea and vomiting)    Urticaria     Allergies  Allergen Reactions   Decadron  [Dexamethasone ] Anaphylaxis   Medrol  [Methylprednisolone ] Anaphylaxis   Other Anaphylaxis    Pt states she is allergic to all steroids    Prednisone Hives, Itching, Swelling and Other (See Comments)    Lips and tongue swell     Chlorhexidine       Review of Systems:  No headache, visual changes, nausea, vomiting, diarrhea, constipation, dizziness, abdominal pain, skin rash, fevers, chills, night sweats, weight loss, swollen lymph nodes, body aches, joint swelling, chest pain, shortness of breath, mood changes. POSITIVE muscle aches  Objective  Last menstrual period 09/06/2014,  unknown if currently breastfeeding.   General: No apparent distress alert and oriented x3 mood and affect normal, dressed appropriately.  HEENT: Pupils equal, extraocular movements intact  Respiratory: Patient's speak in full sentences and does not appear short of breath  Cardiovascular: No lower extremity edema, non tender, no erythema  Gait MSK:  Back does have some loss lordosis noted.  Some tenderness to palpation in the paraspinal musculature.  Tightness with FABER right greater than left.  Osteopathic findings  C2 flexed rotated and side bent right C6 flexed rotated and side bent right T3 extended rotated and side bent right inhaled rib T9 extended rotated and side bent left L2 flexed rotated and side bent right Sacrum right on right     Assessment and Plan:  No problem-specific Assessment & Plan notes found for this encounter.    Nonallopathic problems  Decision today to treat with OMT was based on Physical Exam  After verbal consent patient was treated with HVLA, ME, FPR techniques in cervical, rib, thoracic, lumbar, and sacral  areas  Patient tolerated the procedure well with improvement in symptoms  Patient given exercises, stretches and lifestyle modifications  See medications in patient instructions if given  Patient will follow up in 4-8 weeks    The above documentation has been reviewed and is accurate and complete Joanthan Hlavacek M Iverna Hammac, DO          Note: This dictation was prepared with Dragon dictation along with smaller phrase technology. Any transcriptional errors  that result from this process are unintentional.

## 2024-05-30 ENCOUNTER — Ambulatory Visit: Admitting: Family Medicine

## 2024-05-30 ENCOUNTER — Encounter: Payer: Self-pay | Admitting: Family Medicine

## 2024-05-30 VITALS — BP 110/78 | HR 55 | Ht 59.0 in | Wt 134.0 lb

## 2024-05-30 DIAGNOSIS — M9903 Segmental and somatic dysfunction of lumbar region: Secondary | ICD-10-CM | POA: Diagnosis not present

## 2024-05-30 DIAGNOSIS — M9904 Segmental and somatic dysfunction of sacral region: Secondary | ICD-10-CM | POA: Diagnosis not present

## 2024-05-30 DIAGNOSIS — M9908 Segmental and somatic dysfunction of rib cage: Secondary | ICD-10-CM

## 2024-05-30 DIAGNOSIS — M9901 Segmental and somatic dysfunction of cervical region: Secondary | ICD-10-CM

## 2024-05-30 DIAGNOSIS — G2589 Other specified extrapyramidal and movement disorders: Secondary | ICD-10-CM

## 2024-05-30 DIAGNOSIS — M9902 Segmental and somatic dysfunction of thoracic region: Secondary | ICD-10-CM

## 2024-05-30 NOTE — Patient Instructions (Addendum)
 Good to see you! Hope everything levels out [email or web address] See you again in 2 months

## 2024-05-30 NOTE — Assessment & Plan Note (Signed)
 Scapular dyskinesis noted still.  Has been working more, more anxiety as well noted.  Discussed icing regimen and home exercises, increase activity slowly.  Discussed which activities to do and which ones to avoid.  Follow-up again in 6 to 12 weeks.

## 2024-07-30 NOTE — Progress Notes (Signed)
 " [name] [address] [place] [address] Phone: [phone] Subjective:   Krista [name], am serving as a scribe for Dr. Arthea Claudene.  I'm seeing this patient by the request  of:  Krista [name] LABOR, MD  CC: Back and neck pain follow-up  YEP:Dlagzrupcz  Krista [name] is a 42 y.o. female coming in with complaint of back and neck pain. OMT 05/30/2024. Patient states continues having difficulty noted.  Discussed icing regimen and home exercises which she has been doing occasionally.  Has been working significantly more which she thinks does cause more discomfort.  Medications patient has been prescribed: None  Taking:         Reviewed prior external information including notes and imaging from previsou exam, outside providers and external EMR if available.   As well as notes that were available from care everywhere and other healthcare systems.  Past medical history, social, surgical and family history all reviewed in electronic medical record.  No pertanent information unless stated regarding to the chief complaint.   Past Medical History:  Diagnosis Date   Asthma    exercise induced   Complication of anesthesia    DVT (deep venous thrombosis) (HCC)    Family history of adverse reaction to anesthesia    PONV   Obesity    PONV (postoperative nausea and vomiting)    Urticaria     Allergies[1]   Review of Systems:  No headache, visual changes, nausea, vomiting, diarrhea, constipation, dizziness, abdominal pain, skin rash, fevers, chills, night sweats, weight loss, swollen lymph nodes, body aches, joint swelling, chest pain, shortness of breath, mood changes. POSITIVE muscle aches  Objective  Blood pressure 118/82, pulse 88, height 4' 11 (1.499 m), weight 140 lb (63.5 kg), last menstrual period 09/06/2014, SpO2 98%, unknown if currently breastfeeding.   General: No apparent distress alert and oriented x3 mood and affect normal,  dressed appropriately.  HEENT: Pupils equal, extraocular movements intact  Respiratory: Patient's speak in full sentences and does not appear short of breath  Cardiovascular: No lower extremity edema, non tender, no erythema  Gait MSK:  Back   Osteopathic findings  C2 flexed rotated and side bent right C6 flexed rotated and side bent left T3 extended rotated and side bent right inhaled rib T9 extended rotated and side bent left L2 flexed rotated and side bent right Sacrum right on right       Assessment and Plan:  Scapular dyskinesis Scapular dyskinesis for this individual.  Patient is going to continue to work on posture and ergonomics.  Does respond well to osteopathic manipulation after further evaluation.  Patient will work on the ergonomics at work where possible.  No change in medications.  Follow-up again in 6 to 12 weeks.    Nonallopathic problems  Decision today to treat with OMT was based on Physical Exam  After verbal consent patient was treated with HVLA, ME, FPR techniques in cervical, rib, thoracic, lumbar, and sacral  areas  Patient tolerated the procedure well with improvement in symptoms  Patient given exercises, stretches and lifestyle modifications  See medications in patient instructions if given  Patient will follow up in 4-8 weeks     The above documentation has been reviewed and is accurate and complete Krista [name] M Krista Graham, DO         Note: This dictation was prepared with Dragon dictation along with smaller phrase technology. Any transcriptional errors that result from this process are  unintentional.            [1]  Allergies Allergen Reactions   Decadron  [Dexamethasone ] Anaphylaxis   Medrol  [Methylprednisolone ] Anaphylaxis   Other Anaphylaxis    Pt states she is allergic to all steroids    Prednisone Hives, Itching, Swelling and Other (See Comments)    Lips and tongue swell     Chlorhexidine     "

## 2024-08-01 ENCOUNTER — Encounter: Payer: Self-pay | Admitting: Family Medicine

## 2024-08-01 ENCOUNTER — Ambulatory Visit: Admitting: Family Medicine

## 2024-08-01 VITALS — BP 118/82 | HR 88 | Ht 59.0 in | Wt 140.0 lb

## 2024-08-01 DIAGNOSIS — M9904 Segmental and somatic dysfunction of sacral region: Secondary | ICD-10-CM

## 2024-08-01 DIAGNOSIS — M9908 Segmental and somatic dysfunction of rib cage: Secondary | ICD-10-CM | POA: Diagnosis not present

## 2024-08-01 DIAGNOSIS — M9901 Segmental and somatic dysfunction of cervical region: Secondary | ICD-10-CM

## 2024-08-01 DIAGNOSIS — M9902 Segmental and somatic dysfunction of thoracic region: Secondary | ICD-10-CM

## 2024-08-01 DIAGNOSIS — G2589 Other specified extrapyramidal and movement disorders: Secondary | ICD-10-CM | POA: Diagnosis not present

## 2024-08-01 DIAGNOSIS — M9903 Segmental and somatic dysfunction of lumbar region: Secondary | ICD-10-CM | POA: Diagnosis not present

## 2024-08-01 NOTE — Assessment & Plan Note (Signed)
 Scapular dyskinesis for this individual.  Patient is going to continue to work on posture and ergonomics.  Does respond well to osteopathic manipulation after further evaluation.  Patient will work on the ergonomics at work where possible.  No change in medications.  Follow-up again in 6 to 12 weeks.

## 2024-08-01 NOTE — Patient Instructions (Signed)
Happy holidays!  See me again in 2 months

## 2024-10-02 ENCOUNTER — Ambulatory Visit: Admitting: Family Medicine
